# Patient Record
Sex: Female | Born: 1960 | Race: White | Hispanic: No | Marital: Married | Smoking: Former smoker
Health system: Southern US, Community
[De-identification: ages and names within clinical notes are randomized; demographics above are authoritative.]

## PROBLEM LIST (undated history)

## (undated) DIAGNOSIS — K219 Gastro-esophageal reflux disease without esophagitis: Secondary | ICD-10-CM

## (undated) DIAGNOSIS — K635 Polyp of colon: Secondary | ICD-10-CM

## (undated) DIAGNOSIS — C73 Malignant neoplasm of thyroid gland: Secondary | ICD-10-CM

## (undated) DIAGNOSIS — G43909 Migraine, unspecified, not intractable, without status migrainosus: Secondary | ICD-10-CM

## (undated) DIAGNOSIS — C4492 Squamous cell carcinoma of skin, unspecified: Secondary | ICD-10-CM

## (undated) DIAGNOSIS — E785 Hyperlipidemia, unspecified: Secondary | ICD-10-CM

## (undated) DIAGNOSIS — E119 Type 2 diabetes mellitus without complications: Secondary | ICD-10-CM

## (undated) DIAGNOSIS — E079 Disorder of thyroid, unspecified: Secondary | ICD-10-CM

## (undated) DIAGNOSIS — I10 Essential (primary) hypertension: Secondary | ICD-10-CM

## (undated) DIAGNOSIS — D3500 Benign neoplasm of unspecified adrenal gland: Secondary | ICD-10-CM

## (undated) DIAGNOSIS — E039 Hypothyroidism, unspecified: Secondary | ICD-10-CM

## (undated) DIAGNOSIS — C801 Malignant (primary) neoplasm, unspecified: Secondary | ICD-10-CM

## (undated) DIAGNOSIS — K760 Fatty (change of) liver, not elsewhere classified: Secondary | ICD-10-CM

## (undated) DIAGNOSIS — K8020 Calculus of gallbladder without cholecystitis without obstruction: Secondary | ICD-10-CM

## (undated) HISTORY — DX: Migraine, unspecified, not intractable, without status migrainosus: G43.909

## (undated) HISTORY — PX: THYROIDECTOMY: SHX17

## (undated) HISTORY — DX: Malignant neoplasm of thyroid gland: C73

## (undated) HISTORY — DX: Fatty (change of) liver, not elsewhere classified: K76.0

## (undated) HISTORY — DX: Type 2 diabetes mellitus without complications: E11.9

## (undated) HISTORY — PX: POLYPECTOMY: SHX149

## (undated) HISTORY — DX: Polyp of colon: K63.5

## (undated) HISTORY — DX: Benign neoplasm of unspecified adrenal gland: D35.00

## (undated) HISTORY — PX: COLONOSCOPY: SHX174

## (undated) HISTORY — DX: Gastro-esophageal reflux disease without esophagitis: K21.9

## (undated) HISTORY — DX: Hypothyroidism, unspecified: E03.9

## (undated) HISTORY — DX: Hyperlipidemia, unspecified: E78.5

## (undated) HISTORY — PX: OTHER SURGICAL HISTORY: SHX169

## (undated) HISTORY — DX: Calculus of gallbladder without cholecystitis without obstruction: K80.20

## (undated) HISTORY — DX: Essential (primary) hypertension: I10

## (undated) HISTORY — DX: Squamous cell carcinoma of skin, unspecified: C44.92

## (undated) HISTORY — PX: TUBAL LIGATION: SHX77

## (undated) HISTORY — DX: Disorder of thyroid, unspecified: E07.9

## (undated) HISTORY — PX: TONSILLECTOMY: SUR1361

---
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
## 1995-02-24 HISTORY — PX: TUBAL LIGATION: SHX77

## 1998-02-23 HISTORY — PX: COLONOSCOPY: SHX5424

## 2007-02-24 HISTORY — PX: THYROIDECTOMY: SHX17

## 2014-06-12 LAB — HM PAP SMEAR

## 2014-11-21 ENCOUNTER — Encounter: Payer: Self-pay | Admitting: Family Medicine

## 2014-11-21 ENCOUNTER — Ambulatory Visit (INDEPENDENT_AMBULATORY_CARE_PROVIDER_SITE_OTHER): Payer: BLUE CROSS/BLUE SHIELD | Admitting: Family Medicine

## 2014-11-21 VITALS — BP 174/120 | HR 80 | Ht 68.0 in | Wt 265.0 lb

## 2014-11-21 DIAGNOSIS — E890 Postprocedural hypothyroidism: Secondary | ICD-10-CM

## 2014-11-21 DIAGNOSIS — N3001 Acute cystitis with hematuria: Secondary | ICD-10-CM | POA: Diagnosis not present

## 2014-11-21 DIAGNOSIS — Z23 Encounter for immunization: Secondary | ICD-10-CM | POA: Diagnosis not present

## 2014-11-21 DIAGNOSIS — I10 Essential (primary) hypertension: Secondary | ICD-10-CM | POA: Insufficient documentation

## 2014-11-21 DIAGNOSIS — E039 Hypothyroidism, unspecified: Secondary | ICD-10-CM | POA: Insufficient documentation

## 2014-11-21 DIAGNOSIS — E785 Hyperlipidemia, unspecified: Secondary | ICD-10-CM | POA: Diagnosis not present

## 2014-11-21 DIAGNOSIS — R748 Abnormal levels of other serum enzymes: Secondary | ICD-10-CM

## 2014-11-21 DIAGNOSIS — Z1211 Encounter for screening for malignant neoplasm of colon: Secondary | ICD-10-CM | POA: Diagnosis not present

## 2014-11-21 DIAGNOSIS — Z85850 Personal history of malignant neoplasm of thyroid: Secondary | ICD-10-CM | POA: Insufficient documentation

## 2014-11-21 DIAGNOSIS — R030 Elevated blood-pressure reading, without diagnosis of hypertension: Secondary | ICD-10-CM

## 2014-11-21 DIAGNOSIS — E559 Vitamin D deficiency, unspecified: Secondary | ICD-10-CM

## 2014-11-21 DIAGNOSIS — IMO0001 Reserved for inherently not codable concepts without codable children: Secondary | ICD-10-CM

## 2014-11-21 DIAGNOSIS — E1159 Type 2 diabetes mellitus with other circulatory complications: Secondary | ICD-10-CM | POA: Insufficient documentation

## 2014-11-21 DIAGNOSIS — Z6841 Body Mass Index (BMI) 40.0 and over, adult: Secondary | ICD-10-CM | POA: Insufficient documentation

## 2014-11-21 LAB — POCT URINALYSIS DIPSTICK
BILIRUBIN UA: NEGATIVE
GLUCOSE UA: NEGATIVE
Ketones, UA: NEGATIVE
Nitrite, UA: POSITIVE
SPEC GRAV UA: 1.01
UROBILINOGEN UA: 0.2
pH, UA: 6

## 2014-11-21 MED ORDER — SULFAMETHOXAZOLE-TRIMETHOPRIM 800-160 MG PO TABS: 1.0000 | ORAL_TABLET | Freq: Two times a day (BID) | ORAL | Status: DC

## 2014-11-21 MED ORDER — LEVOTHYROXINE SODIUM 175 MCG PO TABS: 175.0000 ug | ORAL_TABLET | Freq: Every day | ORAL | Status: DC

## 2014-11-21 NOTE — Progress Notes (Signed)
Date:  11/21/2014   Name:  Miranda Delacruz   DOB:  11-23-1960   MRN:  [id number]  PCP:  Adline Potter, MD    Chief Complaint: Abdominal Pain   History of Present Illness:  This is a 54 y.o. female reports 2d hx lower abdominal pain, dysuria, and hematuria, no UTI in years. Just moved here from [place] in May. Tetanus unknown, had colonoscopy for abdominal pain age 88, normal. Hx thyroid ca s/p thyroidectomy 2009 followed by endo in [place]. Mammo and Pap ok in [place] just before left. No hx HTN but in pain and move has been stressful. Reports sign weight gain since recent menopause. Taking supplements for no specific dxs.  Review of Systems:  Review of Systems  Constitutional: Negative for fever, chills and unexpected weight change.  HENT: Negative for ear pain, sore throat and trouble swallowing.   Eyes: Negative for pain.  Respiratory: Negative for shortness of breath.   Cardiovascular: Negative for chest pain, palpitations and leg swelling.  Gastrointestinal: Negative for nausea, vomiting, diarrhea and constipation.  Endocrine: Negative for polyuria.  Genitourinary: Negative for difficulty urinating.  Neurological: Negative for weakness, light-headedness, numbness and headaches.    Patient Active Problem List   Diagnosis Date Noted  . Obesity, Class III, BMI 40-49.9 (morbid obesity) 11/21/2014  . Hypothyroidism 11/21/2014  . HTN (hypertension) 11/21/2014    Prior to Admission medications   Medication Sig Start Date End Date Taking? Authorizing Provider  levothyroxine (SYNTHROID, LEVOTHROID) 175 MCG tablet Take 1 tablet (175 mcg total) by mouth daily before breakfast. 11/21/14  Yes Adline Potter, MD  calcium-vitamin D 250-100 MG-UNIT tablet Take 1 tablet by mouth 1 day or 1 dose.   Yes Historical Provider, MD  co-enzyme Q-10 30 MG capsule Take 100 mg by mouth daily.   Yes Historical Provider, MD  magnesium oxide (MAG-OX) 400 MG tablet Take 2 tablets by mouth daily.   Yes Historical Provider,  MD  Omega-3 Fatty Acids (FISH OIL) 1200 MG CAPS Take 2 capsules by mouth daily.   Yes Historical Provider, MD  sulfamethoxazole-trimethoprim (BACTRIM DS,SEPTRA DS) 800-160 MG tablet Take 1 tablet by mouth 2 (two) times daily. 11/21/14   Adline Potter, MD    No Known Allergies  Past Surgical History  Procedure Laterality Date  . Tonsillectomy    . Tubal ligation    . Thyroidectomy    . Colonoscopy  2000    normal    Social History  Substance Use Topics  . Smoking status: Former Research scientist (life sciences)  . Smokeless tobacco: None  . Alcohol Use: 0.0 oz/week    0 Standard drinks or equivalent per week    Family History  Problem Relation Age of Onset  . Hypertension Mother   . Hypertension Maternal Grandmother     Medication list has been reviewed and updated.  Physical Examination: BP 174/120 mmHg  Pulse 80  Ht 5' 8"  (1.727 m)  Wt 265 lb (120.203 kg)  BMI 40.30 kg/m2  Physical Exam  Constitutional: She appears well-developed and well-nourished. No distress.  HENT:  Head: Normocephalic and atraumatic.  Right Ear: External ear normal.  Left Ear: External ear normal.  Nose: Nose normal.  Mouth/Throat: Oropharynx is clear and moist.  Eyes: Conjunctivae and EOM are normal. Pupils are equal, round, and reactive to light.  Neck: Neck supple.  Cardiovascular: Normal rate, regular rhythm, normal heart sounds and intact distal pulses.   Pulmonary/Chest: Effort normal and breath sounds normal.  Abdominal: Soft. She exhibits no distension  and no mass.  Mild suprapubic tenderness  Genitourinary:  No CVAT  Musculoskeletal: She exhibits no edema.  Lymphadenopathy:    She has no cervical adenopathy.  Neurological: She is alert. Coordination normal.  Skin: Skin is warm and dry. She is not diaphoretic.  Psychiatric: She has a normal mood and affect. Her behavior is normal.    Assessment and Plan:  1. Acute cystitis with hematuria 3+ blood, + nitrite, 2+ leuk on UA, uncomplicated -  sulfamethoxazole-trimethoprim (BACTRIM DS,SEPTRA DS) 800-160 MG tablet; Take 1 tablet by mouth 2 (two) times daily.  Dispense: 6 tablet; Refill: 0 - POCT Urinalysis Dipstick  2. Obesity, Class III, BMI 40-49.9 (morbid obesity) Discussed weight loss/exercise - Comprehensive metabolic panel - CBC - Lipid Profile - Vitamin D (25 hydroxy)  3. Essential hypertension Poor control due to pain, consider medication if elevated on f/u  4. Postoperative hypothyroidism Unclear control - TSH - levothyroxine (SYNTHROID, LEVOTHROID) 175 MCG tablet; Take 1 tablet (175 mcg total) by mouth daily before breakfast.  Dispense: 90 tablet; Refill: 3  5. Hx of thyroid cancer - Ambulatory referral to Endocrinology  6. Need for influenza vaccination - Flu Vaccine QUAD 36+ mos PF IM (Fluarix & Fluzone Quad PF)  7. Colon cancer screening - Ambulatory referral to Gastroenterology  8. Supplement use Consider d/c calcium, co-Q, and mg oxide  Return in about 4 weeks (around 12/19/2014).  Satira Anis. [facility]  11/21/2014

## 2014-12-03 ENCOUNTER — Telehealth: Payer: Self-pay

## 2014-12-03 MED ORDER — FLUCONAZOLE 150 MG PO TABS: 150.0000 mg | ORAL_TABLET | Freq: Once | ORAL | Status: DC

## 2014-12-03 NOTE — Telephone Encounter (Signed)
Sent message to [facility]

## 2014-12-03 NOTE — Addendum Note (Signed)
Addended by: Adline Potter on: 12/03/2014 12:01 PM   Modules accepted: Orders

## 2014-12-05 ENCOUNTER — Telehealth: Payer: Self-pay | Admitting: Gastroenterology

## 2014-12-05 ENCOUNTER — Other Ambulatory Visit: Payer: Self-pay

## 2014-12-05 DIAGNOSIS — Z1211 Encounter for screening for malignant neoplasm of colon: Secondary | ICD-10-CM

## 2014-12-05 MED ORDER — SOD PICOSULFATE-MAG OX-CIT ACD 10-3.5-12 MG-GM-GM PO PACK: 1.0000 | PACK | ORAL | Status: DC

## 2014-12-05 NOTE — Telephone Encounter (Signed)
Gastroenterology Pre-Procedure Review  Request Date: 12-31-2014 Requesting Physician: Dr.   PATIENT REVIEW QUESTIONS: The patient responded to the following health history questions as indicated:    1. Are you having any GI issues? no 2. Do you have a personal history of Polyps? no 3. Do you have a family history of Colon Cancer or Polyps? no 4. Diabetes Mellitus? no 5. Joint replacements in the past 12 months?no 6. Major health problems in the past 3 months?no 7. Any artificial heart valves, MVP, or defibrillator?no    MEDICATIONS & ALLERGIES:    Patient reports the following regarding taking any anticoagulation/antiplatelet therapy:   Plavix, Coumadin, Eliquis, Xarelto, Lovenox, Pradaxa, Brilinta, or Effient? no Aspirin? no  Patient confirms/reports the following medications:  Current Outpatient Prescriptions  Medication Sig Dispense Refill   calcium-vitamin D 250-100 MG-UNIT tablet Take 1 tablet by mouth 1 day or 1 dose.     co-enzyme Q-10 30 MG capsule Take 100 mg by mouth daily.     fluconazole (DIFLUCAN) 150 MG tablet Take 1 tablet (150 mg total) by mouth once. 1 tablet 0   levothyroxine (SYNTHROID, LEVOTHROID) 175 MCG tablet Take 1 tablet (175 mcg total) by mouth daily before breakfast. 90 tablet 3   magnesium oxide (MAG-OX) 400 MG tablet Take 2 tablets by mouth daily.     Omega-3 Fatty Acids (FISH OIL) 1200 MG CAPS Take 2 capsules by mouth daily.     sulfamethoxazole-trimethoprim (BACTRIM DS,SEPTRA DS) 800-160 MG tablet Take 1 tablet by mouth 2 (two) times daily. 6 tablet 0   No current facility-administered medications for this visit.    Patient confirms/reports the following allergies:  No Known Allergies  No orders of the defined types were placed in this encounter.    AUTHORIZATION INFORMATION Primary Insurance: 1D#: Group #:  Secondary Insurance: 1D#: Group #:  SCHEDULE INFORMATION: Date: 12-31-2014 Time: Location:[facility]

## 2014-12-07 ENCOUNTER — Telehealth: Payer: Self-pay

## 2014-12-07 ENCOUNTER — Other Ambulatory Visit: Payer: Self-pay

## 2014-12-07 ENCOUNTER — Other Ambulatory Visit: Payer: Self-pay | Admitting: Family Medicine

## 2014-12-07 MED ORDER — CIPROFLOXACIN HCL 250 MG PO TABS: 250.0000 mg | ORAL_TABLET | Freq: Two times a day (BID) | ORAL | Status: DC

## 2014-12-07 NOTE — Telephone Encounter (Signed)
Sent to [facility] 

## 2014-12-07 NOTE — Telephone Encounter (Signed)
Will send in alternate antibiotic.

## 2014-12-07 NOTE — Telephone Encounter (Signed)
Is this new episode or continuation of sxs from two weeks ago?

## 2014-12-08 LAB — CBC
HEMATOCRIT: 41.1 % (ref 34.0–46.6)
Hemoglobin: 14.4 g/dL (ref 11.1–15.9)
MCH: 32.2 pg (ref 26.6–33.0)
MCHC: 35 g/dL (ref 31.5–35.7)
MCV: 92 fL (ref 79–97)
PLATELETS: 223 10*3/uL (ref 150–379)
RBC: 4.47 x10E6/uL (ref 3.77–5.28)
RDW: 14.4 % (ref 12.3–15.4)
WBC: 5.7 10*3/uL (ref 3.4–10.8)

## 2014-12-08 LAB — COMPREHENSIVE METABOLIC PANEL
A/G RATIO: 2.4 (ref 1.1–2.5)
ALBUMIN: 4.4 g/dL (ref 3.5–5.5)
ALK PHOS: 68 IU/L (ref 39–117)
ALT: 179 IU/L — ABNORMAL HIGH (ref 0–32)
AST: 133 IU/L — ABNORMAL HIGH (ref 0–40)
BILIRUBIN TOTAL: 0.9 mg/dL (ref 0.0–1.2)
BUN / CREAT RATIO: 13 (ref 9–23)
BUN: 9 mg/dL (ref 6–24)
CHLORIDE: 97 mmol/L (ref 97–108)
CO2: 24 mmol/L (ref 18–29)
Calcium: 9.6 mg/dL (ref 8.7–10.2)
Creatinine, Ser: 0.68 mg/dL (ref 0.57–1.00)
GFR calc non Af Amer: 101 mL/min/{1.73_m2} (ref >59)
GFR, EST AFRICAN AMERICAN: 116 mL/min/{1.73_m2} (ref >59)
GLOBULIN, TOTAL: 1.8 g/dL (ref 1.5–4.5)
Glucose: 95 mg/dL (ref 65–99)
POTASSIUM: 3.9 mmol/L (ref 3.5–5.2)
SODIUM: 140 mmol/L (ref 134–144)
TOTAL PROTEIN: 6.2 g/dL (ref 6.0–8.5)

## 2014-12-08 LAB — TSH: TSH: 0.759 u[IU]/mL (ref 0.450–4.500)

## 2014-12-08 LAB — LIPID PANEL
Chol/HDL Ratio: 7.1 ratio units — ABNORMAL HIGH (ref 0.0–4.4)
Cholesterol, Total: 254 mg/dL — ABNORMAL HIGH (ref 100–199)
HDL: 36 mg/dL — AB (ref >39)
LDL CALC: 149 mg/dL — AB (ref 0–99)
TRIGLYCERIDES: 347 mg/dL — AB (ref 0–149)
VLDL Cholesterol Cal: 69 mg/dL — ABNORMAL HIGH (ref 5–40)

## 2014-12-08 LAB — VITAMIN D 25 HYDROXY (VIT D DEFICIENCY, FRACTURES): Vit D, 25-Hydroxy: 18.6 ng/mL — ABNORMAL LOW (ref 30.0–100.0)

## 2014-12-10 DIAGNOSIS — E785 Hyperlipidemia, unspecified: Secondary | ICD-10-CM | POA: Insufficient documentation

## 2014-12-10 DIAGNOSIS — E1169 Type 2 diabetes mellitus with other specified complication: Secondary | ICD-10-CM | POA: Insufficient documentation

## 2014-12-10 DIAGNOSIS — R748 Abnormal levels of other serum enzymes: Secondary | ICD-10-CM | POA: Insufficient documentation

## 2014-12-10 DIAGNOSIS — E559 Vitamin D deficiency, unspecified: Secondary | ICD-10-CM | POA: Insufficient documentation

## 2014-12-10 MED ORDER — VITAMIN D 50 MCG (2000 UT) PO CAPS: 1.0000 | ORAL_CAPSULE | Freq: Every day | ORAL | Status: DC

## 2014-12-10 NOTE — Addendum Note (Signed)
Addended by: Adline Potter on: 12/10/2014 10:22 AM   Modules accepted: Orders, Medications

## 2014-12-11 ENCOUNTER — Other Ambulatory Visit: Payer: BLUE CROSS/BLUE SHIELD

## 2014-12-13 ENCOUNTER — Ambulatory Visit: Payer: BLUE CROSS/BLUE SHIELD

## 2014-12-17 ENCOUNTER — Other Ambulatory Visit: Payer: Self-pay

## 2014-12-18 ENCOUNTER — Ambulatory Visit
Admission: RE | Admit: 2014-12-18 | Discharge: 2014-12-18 | Disposition: A | Discharge status: Home / Self Care | Payer: BLUE CROSS/BLUE SHIELD | Source: Ambulatory Visit | Attending: Family Medicine | Admitting: Family Medicine

## 2014-12-18 DIAGNOSIS — K838 Other specified diseases of biliary tract: Secondary | ICD-10-CM | POA: Insufficient documentation

## 2014-12-18 DIAGNOSIS — K760 Fatty (change of) liver, not elsewhere classified: Secondary | ICD-10-CM

## 2014-12-18 DIAGNOSIS — K8020 Calculus of gallbladder without cholecystitis without obstruction: Secondary | ICD-10-CM | POA: Insufficient documentation

## 2014-12-18 DIAGNOSIS — K769 Liver disease, unspecified: Secondary | ICD-10-CM | POA: Diagnosis not present

## 2014-12-18 DIAGNOSIS — R748 Abnormal levels of other serum enzymes: Secondary | ICD-10-CM

## 2014-12-18 DIAGNOSIS — R945 Abnormal results of liver function studies: Secondary | ICD-10-CM | POA: Diagnosis present

## 2014-12-19 ENCOUNTER — Encounter: Payer: Self-pay | Admitting: Family Medicine

## 2014-12-19 ENCOUNTER — Ambulatory Visit (INDEPENDENT_AMBULATORY_CARE_PROVIDER_SITE_OTHER): Payer: BLUE CROSS/BLUE SHIELD | Admitting: Family Medicine

## 2014-12-19 VITALS — BP 130/100 | HR 70 | Ht 68.0 in | Wt 270.0 lb

## 2014-12-19 DIAGNOSIS — Z85850 Personal history of malignant neoplasm of thyroid: Secondary | ICD-10-CM

## 2014-12-19 DIAGNOSIS — E890 Postprocedural hypothyroidism: Secondary | ICD-10-CM

## 2014-12-19 DIAGNOSIS — K760 Fatty (change of) liver, not elsewhere classified: Secondary | ICD-10-CM | POA: Diagnosis not present

## 2014-12-19 DIAGNOSIS — I10 Essential (primary) hypertension: Secondary | ICD-10-CM | POA: Diagnosis not present

## 2014-12-19 DIAGNOSIS — E559 Vitamin D deficiency, unspecified: Secondary | ICD-10-CM

## 2014-12-19 DIAGNOSIS — E785 Hyperlipidemia, unspecified: Secondary | ICD-10-CM

## 2014-12-19 NOTE — Progress Notes (Signed)
Date:  12/19/2014   Name:  Miranda Delacruz   DOB:  11-17-1960   MRN:  [id number]  PCP:  Adline Potter, MD    Chief Complaint: Follow-up   History of Present Illness:  This is a 54 y.o. female seen in f/u for fatty liver dz, HLD, HTN, and vit D def. Blood work showed elevated LFT's, liver US showed fatty liver. Blood work also showed HLD with elevated TC/HDL ratio (non-fasting) and low vit D level. Patient states she is heavier than ever but plans to start exercise/weight loss program now that she is settled from move. Has endo appt for f/u thyroid ca today and has colonoscopy scheduled for next week.  Review of Systems:  Review of Systems  Respiratory: Negative for shortness of breath.   Cardiovascular: Negative for chest pain and leg swelling.  Gastrointestinal: Negative for abdominal pain.  Endocrine: Negative for polyuria.  Genitourinary: Negative for difficulty urinating.    Patient Active Problem List   Diagnosis Date Noted  . Fatty liver disease, nonalcoholic 96/22/2979  . Elevated liver enzymes 12/10/2014  . Hyperlipidemia 12/10/2014  . Vitamin D deficiency 12/10/2014  . Obesity, Class III, BMI 40-49.9 (morbid obesity) ([place]) 11/21/2014  . Hypothyroidism 11/21/2014  . HTN (hypertension) 11/21/2014  . Hx of thyroid cancer 11/21/2014    Prior to Admission medications   Medication Sig Start Date End Date Taking? Authorizing Provider  calcium-vitamin D 250-100 MG-UNIT tablet Take 1 tablet by mouth 1 day or 1 dose.   Yes Historical Provider, MD  Cholecalciferol (VITAMIN D) 2000 UNITS CAPS Take 1 capsule (2,000 Units total) by mouth daily. 12/10/14  Yes Adline Potter, MD  co-enzyme Q-10 30 MG capsule Take 100 mg by mouth daily.   Yes Historical Provider, MD  levothyroxine (SYNTHROID, LEVOTHROID) 175 MCG tablet Take 1 tablet (175 mcg total) by mouth daily before breakfast. 11/21/14  Yes Adline Potter, MD  magnesium oxide (MAG-OX) 400 MG tablet Take 2 tablets by mouth daily.   Yes  Historical Provider, MD  Omega-3 Fatty Acids (FISH OIL) 1200 MG CAPS Take 2 capsules by mouth daily.   Yes Historical Provider, MD    No Known Allergies  Past Surgical History  Procedure Laterality Date  . Tonsillectomy    . Tubal ligation    . Thyroidectomy    . Colonoscopy  2000    normal    Social History  Substance Use Topics  . Smoking status: Former Research scientist (life sciences)  . Smokeless tobacco: None  . Alcohol Use: 0.0 oz/week    0 Standard drinks or equivalent per week    Family History  Problem Relation Age of Onset  . Hypertension Mother   . Hypertension Maternal Grandmother     Medication list has been reviewed and updated.  Physical Examination: BP 130/100 mmHg  Pulse 70  Ht 5' 8"  (1.727 m)  Wt 270 lb (122.471 kg)  BMI 41.06 kg/m2  Physical Exam  Constitutional: She appears well-developed and well-nourished.  Cardiovascular: Normal rate, regular rhythm and normal heart sounds.   Pulmonary/Chest: Effort normal and breath sounds normal.  Musculoskeletal: She exhibits no edema.  Neurological: She is alert.  Skin: Skin is warm and dry.  Psychiatric: She has a normal mood and affect. Her behavior is normal.  Nursing note and vitals reviewed.   Assessment and Plan:  1. Fatty liver disease, nonalcoholic Discussed etiology, need for weight loss, recheck LFT's next visit  2. Essential hypertension Improved but still marginal, should improve with weight loss  3. Postoperative hypothyroidism Well controlled on current Synthroid dose  4. Obesity, Class III, BMI 40-49.9 (morbid obesity) ([place]) Discussed exercise/weight loss, pt planning to purchase treadmill and begin diet  5. Hx of thyroid cancer To see endo today  6. Hyperlipidemia Should improve with weight loss, recheck lipids (and a1c) next visit  7. Vitamin D deficiency Continue supplementation, recommend d/c calcium/coQ/mg oxide supplements   Return in about 3 months (around 03/21/2015).  Satira Anis. [facility]  12/19/2014

## 2014-12-31 ENCOUNTER — Encounter: Admission: RE | Payer: Self-pay | Source: Ambulatory Visit

## 2014-12-31 ENCOUNTER — Ambulatory Visit
Admission: RE | Admit: 2014-12-31 | Payer: BLUE CROSS/BLUE SHIELD | Source: Ambulatory Visit | Admitting: Gastroenterology

## 2014-12-31 SURGERY — COLONOSCOPY WITH PROPOFOL
Anesthesia: Choice

## 2015-01-22 ENCOUNTER — Encounter
Admission: RE | Disposition: A | Discharge status: Home / Self Care | Payer: Self-pay | Source: Ambulatory Visit | Attending: Gastroenterology

## 2015-01-22 ENCOUNTER — Ambulatory Visit
Admission: RE | Admit: 2015-01-22 | Discharge: 2015-01-22 | Disposition: A | Discharge status: Home / Self Care | Payer: BLUE CROSS/BLUE SHIELD | Source: Ambulatory Visit | Attending: Gastroenterology | Admitting: Gastroenterology

## 2015-01-22 ENCOUNTER — Ambulatory Visit: Payer: BLUE CROSS/BLUE SHIELD | Admitting: Anesthesiology

## 2015-01-22 DIAGNOSIS — D374 Neoplasm of uncertain behavior of colon: Secondary | ICD-10-CM | POA: Insufficient documentation

## 2015-01-22 DIAGNOSIS — E890 Postprocedural hypothyroidism: Secondary | ICD-10-CM | POA: Insufficient documentation

## 2015-01-22 DIAGNOSIS — D123 Benign neoplasm of transverse colon: Secondary | ICD-10-CM | POA: Diagnosis not present

## 2015-01-22 DIAGNOSIS — K5730 Diverticulosis of large intestine without perforation or abscess without bleeding: Secondary | ICD-10-CM | POA: Insufficient documentation

## 2015-01-22 DIAGNOSIS — Z8249 Family history of ischemic heart disease and other diseases of the circulatory system: Secondary | ICD-10-CM | POA: Diagnosis not present

## 2015-01-22 DIAGNOSIS — E079 Disorder of thyroid, unspecified: Secondary | ICD-10-CM | POA: Diagnosis not present

## 2015-01-22 DIAGNOSIS — Z87891 Personal history of nicotine dependence: Secondary | ICD-10-CM | POA: Diagnosis not present

## 2015-01-22 DIAGNOSIS — I10 Essential (primary) hypertension: Secondary | ICD-10-CM | POA: Diagnosis not present

## 2015-01-22 DIAGNOSIS — Z859 Personal history of malignant neoplasm, unspecified: Secondary | ICD-10-CM | POA: Diagnosis not present

## 2015-01-22 DIAGNOSIS — K5520 Angiodysplasia of colon without hemorrhage: Secondary | ICD-10-CM | POA: Insufficient documentation

## 2015-01-22 DIAGNOSIS — Z1211 Encounter for screening for malignant neoplasm of colon: Secondary | ICD-10-CM | POA: Insufficient documentation

## 2015-01-22 DIAGNOSIS — D125 Benign neoplasm of sigmoid colon: Secondary | ICD-10-CM | POA: Diagnosis not present

## 2015-01-22 DIAGNOSIS — Z79899 Other long term (current) drug therapy: Secondary | ICD-10-CM | POA: Diagnosis not present

## 2015-01-22 DIAGNOSIS — D122 Benign neoplasm of ascending colon: Secondary | ICD-10-CM | POA: Insufficient documentation

## 2015-01-22 HISTORY — DX: Malignant (primary) neoplasm, unspecified: C80.1

## 2015-01-22 HISTORY — PX: COLONOSCOPY WITH PROPOFOL: SHX5780

## 2015-01-22 SURGERY — COLONOSCOPY WITH PROPOFOL
Anesthesia: General

## 2015-01-22 MED ORDER — PROPOFOL 500 MG/50ML IV EMUL
INTRAVENOUS | Status: DC | PRN
  Administered 2015-01-22: 160 ug/kg/min via INTRAVENOUS

## 2015-01-22 MED ORDER — MIDAZOLAM HCL 2 MG/2ML IJ SOLN
INTRAMUSCULAR | Status: DC | PRN
  Administered 2015-01-22: 2 mg via INTRAVENOUS

## 2015-01-22 MED ORDER — LIDOCAINE HCL (CARDIAC) 20 MG/ML IV SOLN
INTRAVENOUS | Status: DC | PRN
  Administered 2015-01-22: 100 mg via INTRAVENOUS

## 2015-01-22 MED ORDER — SODIUM CHLORIDE 0.9 % IV SOLN
INTRAVENOUS | Status: DC
  Administered 2015-01-22: 1000 mL via INTRAVENOUS

## 2015-01-22 NOTE — H&P (Signed)
  [facility]  [address] [place], [place] [address] Phone: [phone] Fax : [phone]  Primary Care Physician:  Adline Potter, MD Primary Gastroenterologist:  Dr. Allen Norris  Pre-Procedure History & Physical: HPI:  Miranda Delacruz is a 54 y.o. female is here for a screening colonoscopy.   Past Medical History  Diagnosis Date  . Thyroid disease   . Hypertension   . Cancer [facility])     Past Surgical History  Procedure Laterality Date  . Tonsillectomy    . Tubal ligation    . Thyroidectomy    . Colonoscopy  2000    normal    Prior to Admission medications   Medication Sig Start Date End Date Taking? Authorizing Provider  calcium-vitamin D 250-100 MG-UNIT tablet Take 1 tablet by mouth 1 day or 1 dose.    Historical Provider, MD  Cholecalciferol (VITAMIN D) 2000 UNITS CAPS Take 1 capsule (2,000 Units total) by mouth daily. 12/10/14   Adline Potter, MD  co-enzyme Q-10 30 MG capsule Take 100 mg by mouth daily.    Historical Provider, MD  levothyroxine (SYNTHROID, LEVOTHROID) 175 MCG tablet Take 1 tablet (175 mcg total) by mouth daily before breakfast. 11/21/14   Adline Potter, MD  magnesium oxide (MAG-OX) 400 MG tablet Take 2 tablets by mouth daily.    Historical Provider, MD  Omega-3 Fatty Acids (FISH OIL) 1200 MG CAPS Take 2 capsules by mouth daily.    Historical Provider, MD    Allergies as of 12/17/2014  . (No Known Allergies)    Family History  Problem Relation Age of Onset  . Hypertension Mother   . Hypertension Maternal Grandmother     Social History   Social History  . Marital Status: Married    Spouse Name: N/A  . Number of Children: N/A  . Years of Education: N/A   Occupational History  . Not on file.   Social History Main Topics  . Smoking status: Former Research scientist (life sciences)  . Smokeless tobacco: Not on file  . Alcohol Use: 0.0 oz/week    0 Standard drinks or equivalent per week  . Drug Use: No  . Sexual Activity: Yes   Other Topics Concern    . Not on file   Social History Narrative    Review of Systems: See HPI, otherwise negative ROS  Physical Exam: BP 179/95 mmHg  Pulse 79  Temp(Src) 98.2 F (36.8 C) (Tympanic)  Resp 20  Ht 5' 8"  (1.727 m)  Wt 266 lb (120.657 kg)  BMI 40.45 kg/m2  SpO2 100% General:   Alert,  pleasant and cooperative in NAD Head:  Normocephalic and atraumatic. Neck:  Supple; no masses or thyromegaly. Lungs:  Clear throughout to auscultation.    Heart:  Regular rate and rhythm. Abdomen:  Soft, nontender and nondistended. Normal bowel sounds, without guarding, and without rebound.   Neurologic:  Alert and  oriented x4;  grossly normal neurologically.  Impression/Plan: Miranda Delacruz is now here to undergo a screening colonoscopy.  Risks, benefits, and alternatives regarding colonoscopy have been reviewed with the patient.  Questions have been answered.  All parties agreeable.

## 2015-01-22 NOTE — Anesthesia Postprocedure Evaluation (Signed)
Anesthesia Post Note  Patient: Miranda Delacruz  Procedure(s) Performed: Procedure(s) (LRB): COLONOSCOPY WITH PROPOFOL (N/A)  Patient location during evaluation: PACU Anesthesia Type: General Level of consciousness: awake and alert Pain management: pain level controlled Vital Signs Assessment: post-procedure vital signs reviewed and stable Respiratory status: spontaneous breathing, nonlabored ventilation, respiratory function stable and patient connected to nasal cannula oxygen Cardiovascular status: blood pressure returned to baseline and stable Postop Assessment: no signs of nausea or vomiting Anesthetic complications: no    Last Vitals:  Filed Vitals:   01/22/15 0910 01/22/15 0920  BP: 137/89 147/88  Pulse:    Temp:    Resp:      Last Pain:  Filed Vitals:   01/22/15 0932  PainSc: 0-No pain                 Molli Barrows

## 2015-01-22 NOTE — Anesthesia Preprocedure Evaluation (Signed)
Anesthesia Evaluation  Patient identified by MRN, date of birth, ID band Patient awake    Reviewed: Allergy & Precautions, H&P , NPO status , Patient's Chart, lab work & pertinent test results, reviewed documented beta blocker date and time   Airway Mallampati: II  TM Distance: >3 FB Neck ROM: full    Dental no notable dental hx.    Pulmonary neg pulmonary ROS, former smoker,    Pulmonary exam normal breath sounds clear to auscultation       Cardiovascular Exercise Tolerance: Good hypertension, negative cardio ROS   Rhythm:regular Rate:Normal     Neuro/Psych negative neurological ROS  negative psych ROS   GI/Hepatic negative GI ROS, Neg liver ROS,   Endo/Other  negative endocrine ROSHypothyroidism   Renal/GU negative Renal ROS  negative genitourinary   Musculoskeletal negative musculoskeletal ROS (+)   Abdominal   Peds negative pediatric ROS (+)  Hematology negative hematology ROS (+)   Anesthesia Other Findings   Reproductive/Obstetrics negative OB ROS                             Anesthesia Physical Anesthesia Plan  ASA: II  Anesthesia Plan: General   Post-op Pain Management:    Induction:   Airway Management Planned:   Additional Equipment:   Intra-op Plan:   Post-operative Plan:   Informed Consent: I have reviewed the patients History and Physical, chart, labs and discussed the procedure including the risks, benefits and alternatives for the proposed anesthesia with the patient or authorized representative who has indicated his/her understanding and acceptance.   Dental Advisory Given  Plan Discussed with: CRNA  Anesthesia Plan Comments:         Anesthesia Quick Evaluation

## 2015-01-22 NOTE — Op Note (Signed)
[facility] Patient Name: Miranda Delacruz Procedure Date: 01/22/2015 8:21 AM MRN: [id number] Account #: [id number] Date of Birth: 1960-11-23 Admit Type: Outpatient Age: 54 Room: [facility] ENDO ROOM 4 Gender: Female Note Status: Finalized Procedure:         Colonoscopy Indications:       Screening for colorectal malignant neoplasm Providers:         Lucilla Lame, MD Referring MD:      Satira Anis. [facility], MD (Referring MD) Medicines:         Propofol per Anesthesia Complications:     No immediate complications. Procedure:         Pre-Anesthesia Assessment:                    - Prior to the procedure, a History and Physical was                     performed, and patient medications and allergies were                     reviewed. The patient's tolerance of previous anesthesia                     was also reviewed. The risks and benefits of the procedure                     and the sedation options and risks were discussed with the                     patient. All questions were answered, and informed consent                     was obtained. Prior Anticoagulants: The patient has taken                     no previous anticoagulant or antiplatelet agents. ASA                     Grade Assessment: II - A patient with mild systemic                     disease. After reviewing the risks and benefits, the                     patient was deemed in satisfactory condition to undergo                     the procedure.                    After obtaining informed consent, the colonoscope was                     passed under direct vision. Throughout the procedure, the                     patient's blood pressure, pulse, and oxygen saturations                     were monitored continuously. The Colonoscope was                     introduced through the anus and advanced to the the cecum,  identified by appendiceal orifice and ileocecal valve. The                   colonoscopy was performed without difficulty. The patient                     tolerated the procedure well. The quality of the bowel                     preparation was fair. Findings:      The perianal and digital rectal examinations were normal.      A 4 mm polyp was found in the ascending colon. The polyp was sessile.       The polyp was removed with a cold snare. Resection and retrieval were       complete.      A 4 mm polyp was found in the transverse colon. The polyp was sessile.       The polyp was removed with a cold biopsy forceps. Resection and       retrieval were complete.      A 3 mm polyp was found in the sigmoid colon. The polyp was sessile. The       polyp was removed with a cold biopsy forceps. Resection and retrieval       were complete.      Multiple small-mouthed diverticula were found in the sigmoid colon and       in the descending colon.      A single medium-sized localized angiodysplastic lesion with bleeding on       contact was found in the cecum. Impression:        - One 4 mm polyp in the ascending colon. Resected and                     retrieved.                    - One 4 mm polyp in the transverse colon. Resected and                     retrieved.                    - One 3 mm polyp in the sigmoid colon. Resected and                     retrieved.                    - Diverticulosis in the sigmoid colon and in the                     descending colon.                    - A single colonic angiodysplastic lesion. Recommendation:    - Await pathology results.                    - Repeat colonoscopy in 5 years if polyp adenoma and 10                     years if hyperplastic Procedure Code(s): --- Professional ---                    [phone], Colonoscopy, flexible; with removal of tumor(s),  polyp(s), or other lesion(s) by snare technique                    [id number], 59, Colonoscopy, flexible; with biopsy, single or                      multiple Diagnosis Code(s): --- Professional ---                    Z12.11, Encounter for screening for malignant neoplasm of                     colon                    D12.2, Benign neoplasm of ascending colon                    D12.3, Benign neoplasm of transverse colon                    D12.5, Benign neoplasm of sigmoid colon CPT copyright 2014 American Medical Association. All rights reserved. The codes documented in this report are preliminary and upon coder review may  be revised to meet current compliance requirements. Lucilla Lame, MD 01/22/2015 8:41:41 AM This report has been signed electronically. Number of Addenda: 0 Note Initiated On: 01/22/2015 8:21 AM Scope Withdrawal Time: 0 hours 7 minutes 45 seconds  Total Procedure Duration: 0 hours 12 minutes 49 seconds       [facility]

## 2015-01-22 NOTE — Transfer of Care (Signed)
Immediate Anesthesia Transfer of Care Note  Patient: Miranda Delacruz  Procedure(s) Performed: Procedure(s): COLONOSCOPY WITH PROPOFOL (N/A)  Patient Location: PACU and Endoscopy Unit  Anesthesia Type:General  Level of Consciousness: sedated  Airway & Oxygen Therapy: Patient Spontanous Breathing and Patient connected to nasal cannula oxygen  Post-op Assessment: Report given to RN and Post -op Vital signs reviewed and stable  Post vital signs: Reviewed and stable  Last Vitals: 8:43 119/74 100% 78 hr 16 resp 97 temp Filed Vitals:   01/22/15 0755 01/22/15 0756  BP:  179/95  Pulse: 79   Temp: 36.8 C   Resp: 20     Complications: No apparent anesthesia complications

## 2015-01-23 ENCOUNTER — Encounter: Payer: Self-pay | Admitting: Gastroenterology

## 2015-01-23 LAB — SURGICAL PATHOLOGY

## 2015-01-24 ENCOUNTER — Encounter: Payer: Self-pay | Admitting: Gastroenterology

## 2015-02-11 ENCOUNTER — Encounter: Payer: Self-pay | Admitting: Family Medicine

## 2015-02-11 ENCOUNTER — Ambulatory Visit (INDEPENDENT_AMBULATORY_CARE_PROVIDER_SITE_OTHER): Payer: BLUE CROSS/BLUE SHIELD | Admitting: Family Medicine

## 2015-02-11 VITALS — BP 140/90 | HR 80 | Ht 68.0 in | Wt 265.4 lb

## 2015-02-11 DIAGNOSIS — Z85850 Personal history of malignant neoplasm of thyroid: Secondary | ICD-10-CM | POA: Diagnosis not present

## 2015-02-11 DIAGNOSIS — G43109 Migraine with aura, not intractable, without status migrainosus: Secondary | ICD-10-CM

## 2015-02-11 DIAGNOSIS — E890 Postprocedural hypothyroidism: Secondary | ICD-10-CM | POA: Diagnosis not present

## 2015-02-11 DIAGNOSIS — E559 Vitamin D deficiency, unspecified: Secondary | ICD-10-CM

## 2015-02-11 DIAGNOSIS — K760 Fatty (change of) liver, not elsewhere classified: Secondary | ICD-10-CM

## 2015-02-11 DIAGNOSIS — E785 Hyperlipidemia, unspecified: Secondary | ICD-10-CM | POA: Diagnosis not present

## 2015-02-11 DIAGNOSIS — I10 Essential (primary) hypertension: Secondary | ICD-10-CM

## 2015-02-11 MED ORDER — SUMATRIPTAN SUCCINATE 100 MG PO TABS: 100.0000 mg | ORAL_TABLET | Freq: Once | ORAL | Status: DC

## 2015-02-11 NOTE — Progress Notes (Signed)
Date:  02/11/2015   Name:  Miranda Delacruz   DOB:  16-Feb-1961   MRN:  [id number]  PCP:  Adline Potter, MD    Chief Complaint: Hypertension and Headache   History of Present Illness:  This is a 54 y.o. female for f/u HTN, fatty liver, HLD, vit D def. Saw endo re: hx thyroid ca, no new issues. Had colonoscopy with 3 precancerous polyps, f/u in 5 yrs recommended. Saw ENT last week, received Zpak for sinusitis, improved. Has lost 5# with better diet, more exercise. Prefers to keep taking fish oil, calcium, magnesium, and co-Q10 supplements (says eats little dairy). Also c/o migraines with aura since age 42, 3 episodes past 6 months, previously responded to Midrin, wants to try oral Imitrex.  Review of Systems:  Review of Systems  Constitutional: Negative for fever and fatigue.  Respiratory: Negative for shortness of breath.   Cardiovascular: Negative for chest pain and leg swelling.  Neurological: Negative for syncope and light-headedness.    Patient Active Problem List   Diagnosis Date Noted  . Migraine headache with aura 02/11/2015  . Special screening for malignant neoplasms, colon   . Benign neoplasm of ascending colon   . Benign neoplasm of transverse colon   . Benign neoplasm of sigmoid colon   . Fatty liver disease, nonalcoholic 67/20/9470  . Elevated liver enzymes 12/10/2014  . Hyperlipidemia 12/10/2014  . Vitamin D deficiency 12/10/2014  . Obesity, Class III, BMI 40-49.9 (morbid obesity) ([place]) 11/21/2014  . Hypothyroidism 11/21/2014  . HTN (hypertension) 11/21/2014  . Hx of thyroid cancer 11/21/2014    Prior to Admission medications   Medication Sig Start Date End Date Taking? Authorizing Provider  calcium-vitamin D 250-100 MG-UNIT tablet Take 1 tablet by mouth 1 day or 1 dose.   Yes Historical Provider, MD  Cholecalciferol (VITAMIN D) 2000 UNITS CAPS Take 1 capsule (2,000 Units total) by mouth daily. 12/10/14  Yes Adline Potter, MD  co-enzyme Q-10 30 MG capsule Take 100  mg by mouth daily.   Yes Historical Provider, MD  levothyroxine (SYNTHROID, LEVOTHROID) 175 MCG tablet Take 1 tablet (175 mcg total) by mouth daily before breakfast. 11/21/14  Yes Adline Potter, MD  magnesium oxide (MAG-OX) 400 MG tablet Take 2 tablets by mouth daily.   Yes Historical Provider, MD  Omega-3 Fatty Acids (FISH OIL) 1200 MG CAPS Take 2 capsules by mouth daily.   Yes Historical Provider, MD  SUMAtriptan (IMITREX) 100 MG tablet Take 1 tablet (100 mg total) by mouth once. May repeat in 2 hours if headache persists or recurs. 02/11/15   Adline Potter, MD    No Known Allergies  Past Surgical History  Procedure Laterality Date  . Tonsillectomy    . Tubal ligation    . Thyroidectomy    . Colonoscopy  2000    normal  . Colonoscopy with propofol N/A 01/22/2015    Procedure: COLONOSCOPY WITH PROPOFOL;  Surgeon: Lucilla Lame, MD;  Location: [facility] ENDOSCOPY;  Service: Endoscopy;  Laterality: N/A;    Social History  Substance Use Topics  . Smoking status: Former Research scientist (life sciences)  . Smokeless tobacco: Not on file  . Alcohol Use: 0.0 oz/week    0 Standard drinks or equivalent per week    Family History  Problem Relation Age of Onset  . Hypertension Mother   . Hypertension Maternal Grandmother     Medication list has been reviewed and updated.  Physical Examination: BP 178/102 mmHg  Pulse 80  Ht 5' 8"  (1.727 m)  Wt 265 lb 6.4 oz (120.385 kg)  BMI 40.36 kg/m2  Physical Exam  Constitutional: She appears well-developed and well-nourished.  Cardiovascular: Regular rhythm and normal heart sounds.   Pulmonary/Chest: Effort normal and breath sounds normal.  Musculoskeletal: She exhibits no edema.  Neurological: She is alert.  Skin: Skin is warm and dry.  Psychiatric: She has a normal mood and affect. Her behavior is normal.  Nursing note and vitals reviewed.   Assessment and Plan:  1. Essential hypertension Marginal control, pt wishes to avoid rx medication, will continue to  exercise/lose weight, recheck one month - Comprehensive metabolic panel  2. Migraine with aura and without status migrainosus, not intractable Trial PO Imitrex, consider neuro referral if ineffective - SUMAtriptan (IMITREX) 100 MG tablet; Take 1 tablet (100 mg total) by mouth once. May repeat in 2 hours if headache persists or recurs.  Dispense: 10 tablet; Refill: 2  3. Fatty liver disease, nonalcoholic Recheck liver enzymes today  4. Postoperative hypothyroidism Well controlled, continue current Synthroid dose  5. Obesity, Class III, BMI 40-49.9 (morbid obesity) ([facility]) Cont exercise/weight loss - HgB A1c  6. Hx of thyroid cancer Followed by endo  7. Hyperlipidemia Recheck lipids today - Lipid Profile  8. Vitamin D deficiency On supplementation, recheck level - Vitamin D (25 hydroxy)  Return in about 1 month (around 03/14/2015).  Satira Anis. [facility]  02/11/2015

## 2015-03-21 ENCOUNTER — Ambulatory Visit: Payer: BLUE CROSS/BLUE SHIELD | Admitting: Family Medicine

## 2015-04-11 ENCOUNTER — Ambulatory Visit: Payer: BLUE CROSS/BLUE SHIELD | Admitting: Family Medicine

## 2015-04-17 ENCOUNTER — Ambulatory Visit: Payer: BLUE CROSS/BLUE SHIELD | Admitting: Family Medicine

## 2015-04-19 ENCOUNTER — Ambulatory Visit: Payer: BLUE CROSS/BLUE SHIELD | Admitting: Family Medicine

## 2015-09-13 ENCOUNTER — Ambulatory Visit (INDEPENDENT_AMBULATORY_CARE_PROVIDER_SITE_OTHER): Payer: BLUE CROSS/BLUE SHIELD

## 2015-09-13 ENCOUNTER — Encounter: Payer: Self-pay | Admitting: *Deleted

## 2015-09-13 ENCOUNTER — Ambulatory Visit
Admission: EM | Admit: 2015-09-13 | Discharge: 2015-09-13 | Disposition: A | Discharge status: Home / Self Care | Payer: BLUE CROSS/BLUE SHIELD | Attending: Family Medicine | Admitting: Family Medicine

## 2015-09-13 DIAGNOSIS — S80812A Abrasion, left lower leg, initial encounter: Secondary | ICD-10-CM

## 2015-09-13 DIAGNOSIS — S9032XA Contusion of left foot, initial encounter: Secondary | ICD-10-CM

## 2015-09-13 MED ORDER — TETANUS-DIPHTH-ACELL PERTUSSIS 5-2.5-18.5 LF-MCG/0.5 IM SUSP
0.5000 mL | Freq: Once | INTRAMUSCULAR | Status: AC
  Administered 2015-09-13: 0.5 mL via INTRAMUSCULAR

## 2015-09-13 NOTE — ED Notes (Signed)
Pt tripped and fell yesterday landing on left foot and lower leg. C/o left foot pain, edema, difficulty bearing weight, abrasion/avulsion to dorsal aspect. Also, abrasion to left knee and shin. Pt denies any other injuries.

## 2015-09-13 NOTE — Discharge Instructions (Signed)
Rest foot. Elevate. Apply ice.   Follow up with your primary care physician this week as needed. Return to Urgent care for new or worsening concerns.    Foot Contusion A foot contusion is a deep bruise to the foot. Contusions are the result of an injury that caused bleeding under the skin. The contusion may turn blue, purple, or yellow. Minor injuries will give you a painless contusion, but more severe contusions may stay painful and swollen for a few weeks. CAUSES  A foot contusion comes from a direct blow to that area, such as a heavy object falling on the foot. SYMPTOMS   Swelling of the foot.  Discoloration of the foot.  Tenderness or soreness of the foot. DIAGNOSIS  You will have a physical exam and will be asked about your history. You may need an X-ray of your foot to look for a broken bone (fracture).  TREATMENT  An elastic wrap may be recommended to support your foot. Resting, elevating, and applying cold compresses to your foot are often the best treatments for a foot contusion. Over-the-counter medicines may also be recommended for pain control. HOME CARE INSTRUCTIONS   Put ice on the injured area.  Put ice in a plastic bag.  Place a towel between your skin and the bag.  Leave the ice on for 15-20 minutes, 03-04 times a day.  Only take over-the-counter or prescription medicines for pain, discomfort, or fever as directed by your caregiver.  If told, use an elastic wrap as directed. This can help reduce swelling. You may remove the wrap for sleeping, showering, and bathing. If your toes become numb, cold, or blue, take the wrap off and reapply it more loosely.  Elevate your foot with pillows to reduce swelling.  Try to avoid standing or walking while the foot is painful. Do not resume use until instructed by your caregiver. Then, begin use gradually. If pain develops, decrease use. Gradually increase activities that do not cause discomfort until you have normal use of  your foot.  See your caregiver as directed. It is very important to keep all follow-up appointments in order to avoid any lasting problems with your foot, including long-term (chronic) pain. SEEK IMMEDIATE MEDICAL CARE IF:   You have increased redness, swelling, or pain in your foot.  Your swelling or pain is not relieved with medicines.  You have loss of feeling in your foot or are unable to move your toes.  Your foot turns cold or blue.  You have pain when you move your toes.  Your foot becomes warm to the touch.  Your contusion does not improve in 2 days. MAKE SURE YOU:   Understand these instructions.  Will watch your condition.  Will get help right away if you are not doing well or get worse.   This information is not intended to replace advice given to you by your health care provider. Make sure you discuss any questions you have with your health care provider.   Document Released: 12/01/2005 Document Revised: 08/11/2011 Document Reviewed: 10/16/2014 Elsevier Interactive Patient Education 2016 [place] An abrasion is a cut or scrape on the outer surface of your skin. An abrasion does not extend through all of the layers of your skin. It is important to care for your abrasion properly to prevent infection. CAUSES Most abrasions are caused by falling on or gliding across the ground or another surface. When your skin rubs on something, the outer and inner layer of  skin rubs off.  SYMPTOMS A cut or scrape is the main symptom of this condition. The scrape may be bleeding, or it may appear red or pink. If there was an associated fall, there may be an underlying bruise. DIAGNOSIS An abrasion is diagnosed with a physical exam. TREATMENT Treatment for this condition depends on how large and deep the abrasion is. Usually, your abrasion will be cleaned with water and mild soap. This removes any dirt or debris that may be stuck. An antibiotic ointment may be applied  to the abrasion to help prevent infection. A bandage (dressing) may be placed on the abrasion to keep it clean. You may also need a tetanus shot. HOME CARE INSTRUCTIONS Medicines  Take or apply medicines only as directed by your health care provider.  If you were prescribed an antibiotic ointment, finish all of it even if you start to feel better. Wound Care  Clean the wound with mild soap and water 2-3 times per day or as directed by your health care provider. Pat your wound dry with a clean towel. Do not rub it.  There are many different ways to close and cover a wound. Follow instructions from your health care provider about:  Wound care.  Dressing changes and removal.  Check your wound every day for signs of infection. Watch for:  Redness, swelling, or pain.  Fluid, blood, or pus. General Instructions  Keep the dressing dry as directed by your health care provider. Do not take baths, swim, use a hot tub, or do anything that would put your wound underwater until your health care provider approves.  If there is swelling, raise (elevate) the injured area above the level of your heart while you are sitting or lying down.  Keep all follow-up visits as directed by your health care provider. This is important. SEEK MEDICAL CARE IF:  You received a tetanus shot and you have swelling, severe pain, redness, or bleeding at the injection site.  Your pain is not controlled with medicine.  You have increased redness, swelling, or pain at the site of your wound. SEEK IMMEDIATE MEDICAL CARE IF:  You have a red streak going away from your wound.  You have a fever.  You have fluid, blood, or pus coming from your wound.  You notice a bad smell coming from your wound or your dressing.   This information is not intended to replace advice given to you by your health care provider. Make sure you discuss any questions you have with your health care provider.   Document Released:  11/19/2004 Document Revised: 10/31/2014 Document Reviewed: 02/07/2014 Elsevier Interactive Patient Education [employer].

## 2015-09-13 NOTE — ED Provider Notes (Signed)
[facility]  ____________________________________________  Time seen: Approximately 2:09 PM  I have reviewed the triage vital signs and the nursing notes.   HISTORY  Chief Complaint Foot Injury   HPI Miranda Delacruz is a 55 y.o. female patient presents with mother that for the complaint of left foot pain. Patient states yesterday evening they were walking outside. Patient states that she has stepping stones that are slightly raised and reports that she stepped on the edge and then rolled her left foot causing pain. Patient reports that she then fell directly on her left foot and left leg. Patient states pain only to left foot at the base of the second and third toe. Reports some pain with ambulation but remains ambulatory. Denies numbness or tingling sensation. Denies pain radiation. Denies head injury or loss consciousness. Denies any other pain or injury.  Denies fevers. Denies chest pain or shortness of breath, abdominal pain, dysuria or weakness. Patient reports chronic hypertension in which she states is following with PCP and monitoring, states trying to lose weight. Unsure of last tetanus immunization.  Past Medical History  Diagnosis Date  . Thyroid disease   . Hypertension   . Cancer [facility])     Patient Active Problem List   Diagnosis Date Noted  . Migraine headache with aura 02/11/2015  . Special screening for malignant neoplasms, colon   . Benign neoplasm of ascending colon   . Benign neoplasm of transverse colon   . Benign neoplasm of sigmoid colon   . Fatty liver disease, nonalcoholic 71/69/6789  . Elevated liver enzymes 12/10/2014  . Hyperlipidemia 12/10/2014  . Vitamin D deficiency 12/10/2014  . Obesity, Class III, BMI 40-49.9 (morbid obesity) ([place]) 11/21/2014  . Hypothyroidism 11/21/2014  . HTN (hypertension) 11/21/2014  . Hx of thyroid cancer 11/21/2014    Past Surgical History  Procedure Laterality Date  . Tonsillectomy    . Tubal ligation    .  Thyroidectomy    . Colonoscopy  2000    normal  . Colonoscopy with propofol N/A 01/22/2015    Procedure: COLONOSCOPY WITH PROPOFOL;  Surgeon: Lucilla Lame, MD;  Location: [facility] ENDOSCOPY;  Service: Endoscopy;  Laterality: N/A;    Current Outpatient Rx  Name  Route  Sig  Dispense  Refill  . calcium-vitamin D 250-100 MG-UNIT tablet   Oral   Take 1 tablet by mouth 1 day or 1 dose.         . Cholecalciferol (VITAMIN D) 2000 UNITS CAPS   Oral   Take 1 capsule (2,000 Units total) by mouth daily.   30 capsule      . co-enzyme Q-10 30 MG capsule   Oral   Take 100 mg by mouth daily.         Marland Kitchen levothyroxine (SYNTHROID, LEVOTHROID) 175 MCG tablet   Oral   Take 1 tablet (175 mcg total) by mouth daily before breakfast.   90 tablet   3   . magnesium oxide (MAG-OX) 400 MG tablet   Oral   Take 2 tablets by mouth daily.         . Omega-3 Fatty Acids (FISH OIL) 1200 MG CAPS   Oral   Take 2 capsules by mouth daily.         . SUMAtriptan (IMITREX) 100 MG tablet   Oral   Take 1 tablet (100 mg total) by mouth once. May repeat in 2 hours if headache persists or recurs.   10 tablet   2  Allergies Review of patient's allergies indicates no known allergies.  Family History  Problem Relation Age of Onset  . Hypertension Mother   . Hypertension Maternal Grandmother     Social History Social History  Substance Use Topics  . Smoking status: Former Research scientist (life sciences)  . Smokeless tobacco: None  . Alcohol Use: 0.0 oz/week    0 Standard drinks or equivalent per week    Review of Systems Constitutional: No fever/chills Eyes: No visual changes. ENT: No sore throat. Cardiovascular: Denies chest pain. Respiratory: Denies shortness of breath. Gastrointestinal: No abdominal pain.  No nausea, no vomiting.  No diarrhea.  No constipation. Genitourinary: Negative for dysuria. Musculoskeletal: Negative for back pain.Positive left foot pain. Skin: Negative for rash. Neurological: Negative  for headaches, focal weakness or numbness.  10-point ROS otherwise negative.  ____________________________________________   PHYSICAL EXAM:  VITAL SIGNS: ED Triage Vitals  Enc Vitals Group     BP 09/13/15 1323 177/117 mmHg     Pulse Rate 09/13/15 1323 75     Resp 09/13/15 1323 16     Temp 09/13/15 1323 98.2 F (36.8 C)     Temp Source 09/13/15 1323 Oral     SpO2 09/13/15 1323 99 %     Weight 09/13/15 1323 265 lb (120.203 kg)     Height 09/13/15 1323 5' 8"  (1.727 m)     Head Cir --      Peak Flow --      Pain Score 09/13/15 1331 3     Pain Loc --      Pain Edu? --      Excl. in [place]? --    Today's Vitals   09/13/15 1328 09/13/15 1331 09/13/15 1433 09/13/15 1500  BP: 192/97  186/90   Pulse:   71   Temp:      TempSrc:      Resp:      Height:      Weight:      SpO2:      PainSc:  3   3       Constitutional: Alert and oriented. Well appearing and in no acute distress. Eyes: Conjunctivae are normal. PERRL. EOMI. Head: Atraumatic.  Ears: Normal external appearance bilaterally.  Nose: No congestion/rhinnorhea.  Mouth/Throat: Mucous membranes are moist.  Oropharynx non-erythematous. Neck: No stridor.  No cervical spine tenderness to palpation. Cardiovascular: Normal rate, regular rhythm. Grossly normal heart sounds.  Good peripheral circulation. Respiratory: Normal respiratory effort.  No retractions. Lungs CTAB. No wheezes, rales or rhonchi. Gastrointestinal: Soft and nontender. Morbidly obese abdomen. Musculoskeletal: No lower or upper extremity tenderness nor edema. Bilateral pedal pulses equal and easily palpated. Except: Left dorsal foot at the distal first second and third metatarsals mild to moderate tenderness to palpation with mild swelling, minimal ecchymosis, left foot otherwise nontender, normal capillary refill, full range of motion, sensation intact to left foot. Left lower extremity otherwise nontender. No calf tenderness bilaterally. Left knee  nontender. Neurologic:  Normal speech and language. No gross focal neurologic deficits are appreciated.  Skin:  Skin is warm, dry and intact. No rash noted. Except: Superficial abrasions to left anterior knee and left dorsal foot without surrounding erythema, drainage, foreign body or tenderness. Psychiatric: Mood and affect are normal. Speech and behavior are normal.  ____________________________________________    LABS (all labs ordered are listed, but only abnormal results are displayed)  Labs Reviewed - No data to display __RADIOLOGY  Dg Foot Complete Left  09/13/2015  CLINICAL DATA:  Initial encounter  for Pt with left anterior/distal metatarsal of the 1-3 of the left foot yesterday. No hx of previous trauma to the left foot. Hx of multiple sprains to the left ankle. EXAM: LEFT FOOT - COMPLETE 3+ VIEW COMPARISON:  None. FINDINGS: Dorsal soft tissue swelling about the midfoot and forefoot on the lateral view. Tibiotalar osteoarthritis. No acute fracture or dislocation. IMPRESSION: Soft tissue swelling, without acute osseous abnormality. Electronically Signed   By: Abigail Miyamoto M.D.   On: 09/13/2015 14:26   I, Marylene Land, personally viewed and evaluated these images (plain radiographs) as part of my medical decision making, as well as reviewing the written report by the radiologist.  ____________________________________________   PROCEDURES  Procedure(s) performed: Postoperative shoe applied by RN. Neurovascular intact is a patient. ____________________________________________   INITIAL IMPRESSION / ASSESSMENT AND PLAN / ED COURSE  Pertinent labs & imaging results that were available during my care of the patient were reviewed by me and considered in my medical decision making (see chart for details).  Overall well-appearing patient. Patient is morbidly obese. Patient had a mechanical fall yesterday evening with positive for left foot injury and multiple superficial abrasions.  Tetanus immunization updated. Will evaluate left foot x-ray. Left lower extremity otherwise nontender.  Per radiologist left foot soft tissue swelling, without acute osseous abnormality. Discussed with patient and spouse. Encouraged supportive treatments. Encouraged rest, ice, elevation. Postoperative shoe applied by RN. Patient declines need for crutches or walker. Declines need for additional medication. Counseled regarding close monitoring of blood pressure at home and PCP follow-up in the next few days and stressed importance of this, declines further evaluation at this time. Patient agrees to this and states will follow up.    Discussed follow up with Primary care physician this week. Discussed follow up and return parameters including no resolution or any worsening concerns. Patient verbalized understanding and agreed to plan.   ____________________________________________   FINAL CLINICAL IMPRESSION(S) / ED DIAGNOSES  Final diagnoses:  Foot contusion, left, initial encounter  Leg abrasion, left, initial encounter     Discharge Medication List as of 09/13/2015  2:38 PM      Note: This dictation was prepared with Dragon dictation along with smaller phrase technology. Any transcriptional errors that result from this process are unintentional.       Marylene Land, NP 09/13/15 2136

## 2015-09-25 ENCOUNTER — Encounter: Payer: Self-pay | Admitting: Family Medicine

## 2015-09-25 ENCOUNTER — Telehealth: Payer: Self-pay

## 2015-09-25 ENCOUNTER — Ambulatory Visit (INDEPENDENT_AMBULATORY_CARE_PROVIDER_SITE_OTHER): Payer: BLUE CROSS/BLUE SHIELD | Admitting: Family Medicine

## 2015-09-25 VITALS — BP 170/111 | HR 88 | Ht 68.0 in | Wt 266.0 lb

## 2015-09-25 DIAGNOSIS — I10 Essential (primary) hypertension: Secondary | ICD-10-CM | POA: Diagnosis not present

## 2015-09-25 DIAGNOSIS — G43101 Migraine with aura, not intractable, with status migrainosus: Secondary | ICD-10-CM

## 2015-09-25 DIAGNOSIS — Z85850 Personal history of malignant neoplasm of thyroid: Secondary | ICD-10-CM | POA: Diagnosis not present

## 2015-09-25 DIAGNOSIS — Z780 Asymptomatic menopausal state: Secondary | ICD-10-CM | POA: Insufficient documentation

## 2015-09-25 MED ORDER — METOPROLOL SUCCINATE ER 50 MG PO TB24: 50.0000 mg | ORAL_TABLET | Freq: Every day | ORAL | 0 refills | Status: DC

## 2015-09-25 MED ORDER — ATENOLOL 25 MG PO TABS: 25.0000 mg | ORAL_TABLET | Freq: Every day | ORAL | 0 refills | Status: DC

## 2015-09-25 NOTE — Assessment & Plan Note (Signed)
>>  ASSESSMENT AND PLAN FOR HTN (HYPERTENSION) WRITTEN ON 09/25/2015  9:59 AM BY OPALSKI, DEBORAH, DO  Pt states BP was controlled in remote past due to lifestyle changes, but over past one year- has been very poorly controlled.

## 2015-09-25 NOTE — Assessment & Plan Note (Signed)
Pt states BP was controlled in remote past due to lifestyle changes, but over past one year- has been very poorly controlled.

## 2015-09-25 NOTE — Progress Notes (Signed)
New patient office visit note:  Impression and Recommendations:    1. Essential hypertension   2. Migraine with aura and with status migrainosus, not intractable   3. Hx of papillary thyroid cancer   4. Morbid obesity, unspecified obesity type ([place])     Poorly controlled BP.  Start atenolol patient which she has used in the past.  She will monitor her blood pressure at home with her home blood pressure monitor and keep a log.  Handouts provided.  Patient has Imitrex to use as needed but I'm hoping that this blood pressure medicine will also help with her migraines.  Near future we'll obtain fasting blood work and check her TSH as well as cholesterol levels etc.  Briefly d/c pt wt loss and how it would greatly impact her BP/ overall health  Patient's Medications  New Prescriptions   ATENOLOL (TENORMIN) 25 MG TABLET    Take 1 tablet (25 mg total) by mouth daily.  Previous Medications   CALCIUM-VITAMIN D 250-100 MG-UNIT TABLET    Take 1 tablet by mouth 1 day or 1 dose.   CHOLECALCIFEROL (VITAMIN D) 2000 UNITS CAPS    Take 1 capsule (2,000 Units total) by mouth daily.   CO-ENZYME Q-10 30 MG CAPSULE    Take 100 mg by mouth daily.   LEVOTHYROXINE (SYNTHROID, LEVOTHROID) 175 MCG TABLET    Take 1 tablet (175 mcg total) by mouth daily before breakfast.   MAGNESIUM OXIDE (MAG-OX) 400 MG TABLET    Take 2 tablets by mouth daily.   MILK THISTLE 175 MG TABLET    Take 175 mg by mouth daily.   OMEGA-3 FATTY ACIDS (FISH OIL) 1200 MG CAPS    Take 2 capsules by mouth daily.   SUMATRIPTAN (IMITREX) 100 MG TABLET    Take 1 tablet (100 mg total) by mouth once. May repeat in 2 hours if headache persists or recurs.  Modified Medications   No medications on file  Discontinued Medications   No medications on file    Return in about 3 weeks (around 10/16/2015) for come in for fasting bloodwork and then f/up OV for BP ( startting new med).  The patient was counseled, risk factors were  discussed, anticipatory guidance given.  Gross side effects, risk and benefits, and alternatives of medications discussed with patient.  Patient is aware that all medications have potential side effects and we are unable to predict every side effect or drug-drug interaction that may occur.  Expresses verbal understanding and consents to current therapy plan and treatment regimen.  Please see AVS handed out to patient at the end of our visit for further patient instructions/ counseling done pertaining to today's office visit.    Note: This document was prepared using Dragon voice recognition software and may include unintentional dictation errors.  ----------------------------------------------------------------------------------------------------------------------    Subjective:    Chief Complaint  Patient presents with  . Establish Care    HPI: Miranda Delacruz is a pleasant 55 y.o. female who presents to [place] at [facility] today to review their medical history with me and establish care.   Moved from [place] one yr ago.  Was seeing Dr Ward Givens in [facility].   Hasn't seen him in a while.   Looking for a job currently.    Patient with a history of smoking for only 3 years at 1 pack per day- quit 1989  Has treadmill at home- not using it  HTN: running at home-     170-123  / 111-83.  HA's occ.    Dec 2016: note from [place]:  HTN, fatty liver, HLD, vit D def. Saw endo re: hx thyroid ca, no new issues. Had colonoscopy with 3 precancerous polyps, f/u in 5 yrs recommended.  Saw ENT last week, received Zpak for sinusitis, improved. Has lost 5# with better diet, more exercise.  Prefers to keep taking fish oil, calcium, magnesium, and co-Q10 supplements (says eats little dairy).  Also c/o migraines with aura since age 89, 3 episodes past 6 months, previously responded to Midrin, wants to try oral Imitrex.    Patient Active Problem List   Diagnosis Date Noted  . Menopause  present 09/25/2015  . Migraine headache with aura 02/11/2015  . Special screening for malignant neoplasms, colon   . Benign neoplasm of ascending colon   . Benign neoplasm of transverse colon   . Benign neoplasm of sigmoid colon   . Fatty liver disease, nonalcoholic 96/22/2979  . Elevated liver enzymes 12/10/2014  . Hyperlipidemia 12/10/2014  . Vitamin D deficiency 12/10/2014  . Obesity, Class III, BMI 40-49.9 (morbid obesity) ([place]) 11/21/2014  . Hypothyroidism 11/21/2014  . HTN (hypertension) 11/21/2014  . Hx of papillary thyroid cancer 11/21/2014     Past Medical History:  Diagnosis Date  . Cancer ([place])   . Hyperlipidemia   . Hypertension   . Thyroid disease      Past Surgical History:  Procedure Laterality Date  . COLONOSCOPY  2000   normal  . COLONOSCOPY WITH PROPOFOL N/A 01/22/2015   Procedure: COLONOSCOPY WITH PROPOFOL;  Surgeon: Lucilla Lame, MD;  Location: [facility] ENDOSCOPY;  Service: Endoscopy;  Laterality: N/A;  . THYROIDECTOMY    . TONSILLECTOMY    . TUBAL LIGATION       Family History  Problem Relation Age of Onset  . Hypertension Mother   . Hypertension Maternal Grandmother   . Stroke Maternal Grandmother      History  Drug Use No    History  Alcohol Use  . 0.0 oz/week    History  Smoking Status  . Former Smoker  . Quit date: 02/24/1987  Smokeless Tobacco  . Never Used    Patient's Medications  New Prescriptions   ATENOLOL (TENORMIN) 25 MG TABLET    Take 1 tablet (25 mg total) by mouth daily.  Previous Medications   CALCIUM-VITAMIN D 250-100 MG-UNIT TABLET    Take 1 tablet by mouth 1 day or 1 dose.   CHOLECALCIFEROL (VITAMIN D) 2000 UNITS CAPS    Take 1 capsule (2,000 Units total) by mouth daily.   CO-ENZYME Q-10 30 MG CAPSULE    Take 100 mg by mouth daily.   LEVOTHYROXINE (SYNTHROID, LEVOTHROID) 175 MCG TABLET    Take 1 tablet (175 mcg total) by mouth daily before breakfast.   MAGNESIUM OXIDE (MAG-OX) 400 MG TABLET    Take 2 tablets by  mouth daily.   MILK THISTLE 175 MG TABLET    Take 175 mg by mouth daily.   OMEGA-3 FATTY ACIDS (FISH OIL) 1200 MG CAPS    Take 2 capsules by mouth daily.   SUMATRIPTAN (IMITREX) 100 MG TABLET    Take 1 tablet (100 mg total) by mouth once. May repeat in 2 hours if headache persists or recurs.  Modified Medications   No medications on file  Discontinued Medications   No medications on file    Allergies: Review of patient's allergies indicates no known allergies.  Review of Systems:   ( Completed via Adult Medical History Intake form today ) General:  Denies fever, chills, appetite changes, unexplained weight loss.  Optho/Auditory:   Denies visual changes, blurred vision/LOV, ringing in ears/ diff hearing Respiratory:   Denies SOB, DOE, cough, wheezing.  Cardiovascular:   Denies chest pain, palpitations, new onset peripheral edema  Gastrointestinal:   Denies nausea, vomiting, diarrhea.  Genitourinary:    Denies dysuria, increased frequency, flank pain.  Endocrine:     Denies hot or cold intolerance, polyuria, polydipsia. Musculoskeletal:  Denies unexplained myalgias, joint swelling, arthralgias, gait problems.  Skin:  Denies rash, suspicious lesions or new/ changes in moles Neurological:    Denies dizziness, syncope, unexplained weakness, lightheadedness, numbness  Psychiatric/Behavioral:   Denies mood changes, suicidal or homicidal ideations, hallucinations    Objective:   Blood pressure (!) 170/111, pulse 88, height 5' 8"  (1.727 m), weight 266 lb (120.7 kg). Body mass index is 40.45 kg/m.   General: Well Developed, well nourished, and in no acute distress.  Neuro: Alert and oriented x3, extra-ocular muscles intact, sensation grossly intact.  HEENT: Normocephalic, atraumatic, pupils equal round reactive to light, neck supple, no gross masses, no carotid bruits, no JVD apprec Skin: no gross suspicious lesions or rashes  Cardiac: Regular rate and rhythm, no murmurs rubs or gallops.    Respiratory: Essentially clear to auscultation bilaterally. Not using accessory muscles, speaking in full sentences.  Abdominal: Soft, not grossly distended Musculoskeletal: Ambulates w/o diff, FROM * 4 ext.  Vasc: less 2 sec cap RF, warm and pink  Psych:  No HI/SI, judgement and insight good.

## 2015-09-25 NOTE — Telephone Encounter (Signed)
Sent medication to the pharmacy. Called patient and advised her to schedule a follow up appointment for 28 days from now to follow up on blood pressure. To ensure medication is working.

## 2015-09-25 NOTE — Patient Instructions (Addendum)
[email or web address].[email or web address] American Heart Association recommends 30 minutes of moderate intensity aerobic activity at least 5 days a week; then do wt training 2 days.  Check your BP at home.       Exercising to Lose Weight Exercising can help you to lose weight. In order to lose weight through exercise, you need to do vigorous-intensity exercise. You can tell that you are exercising with vigorous intensity if you are breathing very hard and fast and cannot hold a conversation while exercising. Moderate-intensity exercise helps to maintain your current weight. You can tell that you are exercising at a moderate level if you have a higher heart rate and faster breathing, but you are still able to hold a conversation. HOW OFTEN SHOULD I EXERCISE? Choose an activity that you enjoy and set realistic goals. Your health care provider can help you to make an activity plan that works for you. Exercise regularly as directed by your health care provider. This may include:  Doing resistance training twice each week, such as:  Push-ups.  Sit-ups.  Lifting weights.  Using resistance bands.  Doing a given intensity of exercise for a given amount of time. Choose from these options:  150 minutes of moderate-intensity exercise every week.  75 minutes of vigorous-intensity exercise every week.  A mix of moderate-intensity and vigorous-intensity exercise every week. Children, pregnant women, people who are out of shape, people who are overweight, and older adults may need to consult a health care provider for individual recommendations. If you have any sort of medical condition, be sure to consult your health care provider before starting a new exercise program. WHAT ARE SOME ACTIVITIES THAT CAN HELP ME TO LOSE WEIGHT?   Walking at a rate of at least 4.5 miles an hour.  Jogging or running at a rate of 5 miles per hour.  Biking at a rate of at least 10 miles per hour.  Lap swimming.  Roller-skating or  in-line skating.  Cross-country skiing.  Vigorous competitive sports, such as football, basketball, and soccer.  Jumping rope.  Aerobic dancing. HOW CAN I BE MORE ACTIVE IN MY DAY-TO-DAY ACTIVITIES?  Use the stairs instead of the elevator.  Take a walk during your lunch break.  If you drive, park your car farther away from work or school.  If you take public transportation, get off one stop early and walk the rest of the way.  Make all of your phone calls while standing up and walking around.  Get up, stretch, and walk around every 30 minutes throughout the day. WHAT GUIDELINES SHOULD I FOLLOW WHILE EXERCISING?  Do not exercise so much that you hurt yourself, feel dizzy, or get very short of breath.  Consult your health care provider prior to starting a new exercise program.  Wear comfortable clothes and shoes with good support.  Drink plenty of water while you exercise to prevent dehydration or heat stroke. Body water is lost during exercise and must be replaced.  Work out until you breathe faster and your heart beats faster.   This information is not intended to replace advice given to you by your health care provider. Make sure you discuss any questions you have with your health care provider.   Document Released: 03/14/2010 Document Revised: 03/02/2014 Document Reviewed: 07/13/2013 Elsevier Interactive Patient Education 2016 Colon for a Low Sodium Diet   Low Sodium Diet A main source of sodium is table salt. The average American eats five or more teaspoons  of salt each day. This is about 20 times as much as the body needs. In fact, your body needs only 1/4 teaspoon of salt every day. Sodium is found naturally in foods, but a lot of it is added during processing and preparation. Many foods that do not taste salty may still be high in sodium. Large amounts of sodium can be hidden in canned, processed and convenience foods. And sodium can be found  in many foods that are served at [employer].  Sodium controls fluid balance in our bodies and maintains blood volume and blood pressure. Eating too much sodium may raise blood pressure and cause fluid retention, which could lead to swelling of the legs and feet or other health issues.  When limiting sodium in your diet, a common target is to eat less than 2,000 milligrams of sodium per day.   General Guidelines for Cutting Down on Salt Eliminate salty foods from your diet and reduce the amount of salt used in cooking. Sea salt is no better than regular salt.  Choose low sodium foods. Many salt-free or reduced salt products are available. When reading food labels, low sodium is defined as 140 mg of sodium per serving.  Salt substitutes are sometimes made from potassium, so read the label. If you are on a low potassium diet, then check with your doctor before using those salt substitutes.  Be creative and season your foods with spices, herbs, lemon, garlic, ginger, vinegar and pepper. Remove the salt shaker from the table.  Read ingredient labels to identify foods high in sodium. Items with 400 mg or more of sodium are high in sodium. High sodium food additives include salt, brine, or other items that say sodium, such as monosodium glutamate.  Eat more home-cooked meals. Foods cooked from scratch are naturally lower in sodium than most instant and boxed mixes.  Don't use softened water for cooking and drinking since it contains added salt.  Avoid medications which contain sodium such as [employer].  For more information; food composition books are available which tell how much sodium is in food. Online sources such as www.calorieking.com also list amounts.     Meats, Poultry, Fish, Legumes, Eggs and Nuts  High-Sodium Foods: Smoked, cured, salted or canned meat, fish or poultry including bacon, cold cuts, ham, frankfurters, sausage, sardines, caviar and  anchovies Frozen breaded meats and dinners, such as burritos and pizza Canned entrees, such as ravioli, spam and chili Salted nuts Beans canned with salt added  Low-Sodium Alternatives: Any fresh or frozen beef, lamb, pork, poultry and fish Eggs and egg substitutes Low-sodium peanut butter Dry peas and beans (not canned) Low-sodium canned fish Drained, water or oil packed canned fish or poultry   Dairy Products  High-Sodium Foods: Buttermilk Regular and processed cheese, cheese spreads and sauces Cottage cheese  Low-Sodium Alternatives: Milk, yogurt, ice cream and ice milk Low-sodium cheeses, cream cheese, ricotta cheese and mozzarella   Breads, Grains and Cereals  High-Sodium Foods: Bread and rolls with salted tops Quick breads, self-rising flour, biscuit, pancake and waffle mixes Pizza, croutons and salted crackers Prepackaged, processed mixes for potatoes, rice, pasta and stuffing  Low-Sodium Alternatives: Breads, bagels and rolls without salted tops Muffins and most ready-to-eat cereals All rice and pasta, but do not to add salt when cooking Low-sodium corn and flour tortillas and noodles Low-sodium crackers and breadsticks Unsalted popcorn, chips and pretzels      Vegetables and Fruits  High-Sodium Foods: Regular canned  vegetables and vegetable juices Olives, pickles, sauerkraut and other pickled vegetables Vegetables made with ham, bacon or salted pork Packaged mixes, such as scalloped or au gratin potatoes, frozen hash browns and Tater Tots Commercially prepared pasta and tomato sauces and salsa  Low-Sodium Alternatives: Fresh and frozen vegetables without sauces Low-sodium canned vegetables, sauces and juices Fresh potatoes, frozen [place] fries and instant mashed potatoes Low-salt tomato or V-8 juice. Most fresh, frozen and canned fruit Dried fruits   Soups  High-Sodium Foods: Regular canned and dehydrated soup, broth and bouillon Cup of  noodles and seasoned ramen mixes  Low-Sodium Alternatives: Low-sodium canned and dehydrated soups, broth and bouillon Homemade soups without added salt   Fats, Desserts and Sweets  High-Sodium Foods: Soy sauce, seasoning salt, other sauces and marinades Bottled salad dressings, regular salad dressing with bacon bits Salted butter or margarine Instant pudding and cake Large portions of ketchup, mustard  Low-Sodium Alternatives: Vinegar, unsalted butter or margarine Vegetable oils and low sodium sauces and salad dressings Mayonnaise All desserts made without salt

## 2015-09-25 NOTE — Telephone Encounter (Signed)
Then we will switch her to:  Toprol-XL 50 mg 1 po qd Dispense 30  no refills

## 2015-09-25 NOTE — Telephone Encounter (Signed)
Pt called stating that the atenolol that you prescribed today is on national backorder for the manufacturer.  Pt would like an alternative medication sent in so that she can go ahead and get started on something.  Please advise.  Charyl Bigger, CMA

## 2015-10-07 ENCOUNTER — Other Ambulatory Visit: Payer: Self-pay | Admitting: *Deleted

## 2015-10-15 ENCOUNTER — Other Ambulatory Visit (INDEPENDENT_AMBULATORY_CARE_PROVIDER_SITE_OTHER): Payer: BLUE CROSS/BLUE SHIELD

## 2015-10-15 ENCOUNTER — Other Ambulatory Visit: Payer: Self-pay

## 2015-10-15 DIAGNOSIS — E669 Obesity, unspecified: Secondary | ICD-10-CM

## 2015-10-15 DIAGNOSIS — I10 Essential (primary) hypertension: Secondary | ICD-10-CM

## 2015-10-15 DIAGNOSIS — K760 Fatty (change of) liver, not elsewhere classified: Secondary | ICD-10-CM

## 2015-10-15 DIAGNOSIS — E785 Hyperlipidemia, unspecified: Secondary | ICD-10-CM

## 2015-10-15 DIAGNOSIS — E890 Postprocedural hypothyroidism: Secondary | ICD-10-CM

## 2015-10-15 DIAGNOSIS — E559 Vitamin D deficiency, unspecified: Secondary | ICD-10-CM

## 2015-10-16 LAB — CBC WITH DIFFERENTIAL/PLATELET
BASOS ABS: 0 {cells}/uL (ref 0–200)
Basophils Relative: 0 %
EOS ABS: 80 {cells}/uL (ref 15–500)
EOS PCT: 2 %
HCT: 44 % (ref 35.0–45.0)
HEMOGLOBIN: 15.1 g/dL (ref 11.7–15.5)
LYMPHS ABS: 1480 {cells}/uL (ref 850–3900)
Lymphocytes Relative: 37 %
MCH: 31.5 pg (ref 27.0–33.0)
MCHC: 34.3 g/dL (ref 32.0–36.0)
MCV: 91.7 fL (ref 80.0–100.0)
MPV: 10.4 fL (ref 7.5–12.5)
Monocytes Absolute: 240 cells/uL (ref 200–950)
Monocytes Relative: 6 %
NEUTROS PCT: 55 %
Neutro Abs: 2200 cells/uL (ref 1500–7800)
Platelets: 218 10*3/uL (ref 140–400)
RBC: 4.8 MIL/uL (ref 3.80–5.10)
RDW: 13.8 % (ref 11.0–15.0)
WBC: 4 10*3/uL (ref 3.8–10.8)

## 2015-10-16 LAB — COMPREHENSIVE METABOLIC PANEL
ALBUMIN: 4.8 g/dL (ref 3.6–5.1)
ALK PHOS: 72 U/L (ref 33–130)
ALT: 144 U/L — ABNORMAL HIGH (ref 6–29)
AST: 106 U/L — ABNORMAL HIGH (ref 10–35)
BUN: 9 mg/dL (ref 7–25)
CALCIUM: 9.6 mg/dL (ref 8.6–10.4)
CHLORIDE: 102 mmol/L (ref 98–110)
CO2: 27 mmol/L (ref 20–31)
Creat: 0.84 mg/dL (ref 0.50–1.05)
Glucose, Bld: 207 mg/dL — ABNORMAL HIGH (ref 65–99)
POTASSIUM: 4.4 mmol/L (ref 3.5–5.3)
SODIUM: 139 mmol/L (ref 135–146)
TOTAL PROTEIN: 6.7 g/dL (ref 6.1–8.1)
Total Bilirubin: 1.2 mg/dL (ref 0.2–1.2)

## 2015-10-16 LAB — LIPID PANEL
CHOLESTEROL: 234 mg/dL — AB (ref 125–200)
HDL: 30 mg/dL — ABNORMAL LOW (ref >=46)
LDL Cholesterol: 133 mg/dL — ABNORMAL HIGH (ref <130)
Total CHOL/HDL Ratio: 7.8 Ratio — ABNORMAL HIGH (ref <=5.0)
Triglycerides: 357 mg/dL — ABNORMAL HIGH (ref <150)
VLDL: 71 mg/dL — ABNORMAL HIGH (ref <30)

## 2015-10-16 LAB — TSH: TSH: 2.33 m[IU]/L

## 2015-10-16 LAB — HEMOGLOBIN A1C
HEMOGLOBIN A1C: 6.2 % — AB (ref <5.7)
Mean Plasma Glucose: 131 mg/dL

## 2015-10-16 LAB — VITAMIN D 25 HYDROXY (VIT D DEFICIENCY, FRACTURES): Vit D, 25-Hydroxy: 38 ng/mL (ref 30–100)

## 2015-10-22 ENCOUNTER — Ambulatory Visit: Payer: BLUE CROSS/BLUE SHIELD | Admitting: Family Medicine

## 2015-10-23 ENCOUNTER — Ambulatory Visit: Payer: BLUE CROSS/BLUE SHIELD | Admitting: Family Medicine

## 2015-10-24 ENCOUNTER — Encounter: Payer: Self-pay | Admitting: Family Medicine

## 2015-10-24 ENCOUNTER — Ambulatory Visit (INDEPENDENT_AMBULATORY_CARE_PROVIDER_SITE_OTHER): Payer: BLUE CROSS/BLUE SHIELD | Admitting: Family Medicine

## 2015-10-24 VITALS — BP 157/103 | HR 89 | Ht 68.0 in | Wt 263.1 lb

## 2015-10-24 DIAGNOSIS — E559 Vitamin D deficiency, unspecified: Secondary | ICD-10-CM

## 2015-10-24 DIAGNOSIS — E038 Other specified hypothyroidism: Secondary | ICD-10-CM

## 2015-10-24 DIAGNOSIS — K760 Fatty (change of) liver, not elsewhere classified: Secondary | ICD-10-CM | POA: Diagnosis not present

## 2015-10-24 DIAGNOSIS — R748 Abnormal levels of other serum enzymes: Secondary | ICD-10-CM

## 2015-10-24 DIAGNOSIS — E785 Hyperlipidemia, unspecified: Secondary | ICD-10-CM

## 2015-10-24 DIAGNOSIS — E66813 Obesity, class 3: Secondary | ICD-10-CM

## 2015-10-24 DIAGNOSIS — I10 Essential (primary) hypertension: Secondary | ICD-10-CM | POA: Diagnosis not present

## 2015-10-24 DIAGNOSIS — R7303 Prediabetes: Secondary | ICD-10-CM

## 2015-10-24 DIAGNOSIS — Z85850 Personal history of malignant neoplasm of thyroid: Secondary | ICD-10-CM

## 2015-10-24 DIAGNOSIS — G43109 Migraine with aura, not intractable, without status migrainosus: Secondary | ICD-10-CM

## 2015-10-24 MED ORDER — METOPROLOL SUCCINATE ER 100 MG PO TB24: ORAL_TABLET | ORAL | 0 refills | Status: DC

## 2015-10-24 MED ORDER — VITAMIN D 50 MCG (2000 UT) PO CAPS: 3.0000 | ORAL_CAPSULE | Freq: Every day | ORAL | Status: DC

## 2015-10-24 NOTE — Assessment & Plan Note (Signed)
Blood pressure still not optimal. Patient will increase metoprolol XL to 100 mg daily. She will continue to monitor at home and bring in a blood pressure log at next office visit.

## 2015-10-24 NOTE — Assessment & Plan Note (Addendum)
>>  ASSESSMENT AND PLAN FOR HYPERLIPIDEMIA WRITTEN ON 10/24/2015 12:23 PM BY Kelden Lavallee, DO  > 7.5% 10 yr risk--> I rec statins, but we will try intensive dietary lifestyle modification for 4-6 months because of patient's elevated liver enzymes. We sent her to nutrition specialist today.  ---> B/c elevated liver enzymes-->  Reck in 4months and send pt to nutritionist  _ If NI in chol levels, will need to discuss alternate txmnts.  >>ASSESSMENT AND PLAN FOR LOW SERUM HDL WRITTEN ON 10/24/2015 12:32 PM BY Makensey Rego, DO  Counseling done. Increase aerobic activity to 30+ minutes moderate intensity exercise 5 days a week or more.

## 2015-10-24 NOTE — Assessment & Plan Note (Signed)
>>  ASSESSMENT AND PLAN FOR BMI 40.0-44.9, ADULT ([facility]) WRITTEN ON 10/24/2015  8:56 AM BY OPALSKI, DEBORAH, DO  Total patient due to the increased stress in her life right now don't want her worry about "losing weight ". I want her to focus on healthy eating and improving her overall health and wellness.

## 2015-10-24 NOTE — Assessment & Plan Note (Signed)
Review of ultrasound that was done in 2016. Showed fatty liver infiltrate. Review of labs do not show hepatitis panel so we will obtain one today.

## 2015-10-24 NOTE — Assessment & Plan Note (Signed)
Counseling done. Increase aerobic activity to 30+ minutes moderate intensity exercise 5 days a week or more.

## 2015-10-24 NOTE — Progress Notes (Signed)
Impression and Recommendations:    1. Essential hypertension   2. Fatty liver disease, nonalcoholic   3. Obesity, Class III, BMI 40-49.9 (morbid obesity) ([name])   4. Other specified hypothyroidism   5. Hx of papillary thyroid cancer   6. Elevated liver enzymes   7. Vitamin D deficiency   8. Hyperlipidemia   9. Migraine with aura and without status migrainosus, not intractable   10. Prediabetes   11. Low serum HDL     Fatty liver disease, nonalcoholic 68/01/7516: Ultrasound obtained. IMPRESSION:  1. Gallstones without sonographic evidence of acute cholecystitis.  2. Mild dilation of the common bile duct without definite evidence of intraluminal stones. Limited visualization of the pancreatic head revealed no mass.  3. Increased hepatic echotexture consistent with fatty infiltrative Change.    Prediabetes a1c 6.2  - told she was pre-DM in past about 5 yrs ago or so.  Gave her handouts on high glycemic index foods and to stay away from them. She will try to exercise more to a goal of 30 minutes daily especially for stress management.    Hyperlipidemia > 7.5% 10 yr risk--> I rec statins, but we will try intensive dietary lifestyle modification for 4-6 months because of patient's elevated liver enzymes. We sent her to nutrition specialist today. ---> B/c elevated liver enzymes-->  Reck in 36month and send pt to nutritionist.   If NI in chol levels, will need to discuss alternate txmnts.    Obesity, Class III, BMI 40-49.9 (morbid obesity) ([name] Total patient due to the increased stress in her life right now don't want her worry about "losing weight ". I want her to focus on healthy eating and improving her overall health and wellness.    HTN (hypertension) Blood pressure still not optimal. Patient will increase metoprolol XL to 100 mg daily. She will continue to monitor at home and bring in a blood pressure log at next office visit.    Elevated liver  enzymes Review of ultrasound that was done in 2016. Showed fatty liver infiltrate. Review of labs do not show hepatitis panel so we will obtain one today.    Migraine headache with aura Experiences wavey line aura, usually on L. No visual changes, N/V.   Sx of migraines with aura since age 55   Imitirex- doesn't like how it makes her feel.  B-blockers in past worked best.  No migraines this past couple of months.    Low serum HDL Counseling done. Increase aerobic activity to 30+ minutes moderate intensity exercise 5 days a week or more.   Pt was in the office today for 40+ minutes, with over 50% time spent in face to face counseling of various medical concerns and in coordination of care Patient's Medications  New Prescriptions   No medications on file  Previous Medications   CALCIUM-VITAMIN D 250-100 MG-UNIT TABLET    Take 1 tablet by mouth 1 day or 1 dose.   CO-ENZYME Q-10 30 MG CAPSULE    Take 100 mg by mouth daily.   LEVOTHYROXINE (SYNTHROID, LEVOTHROID) 175 MCG TABLET    Take 1 tablet (175 mcg total) by mouth daily before breakfast.   MAGNESIUM OXIDE (MAG-OX) 400 MG TABLET    Take 2 tablets by mouth daily.   MILK THISTLE 175 MG TABLET    Take 175 mg by mouth daily.   OMEGA-3 FATTY ACIDS (FISH OIL) 1200 MG CAPS    Take 2 capsules by mouth daily.  SUMATRIPTAN (IMITREX) 100 MG TABLET    Take 1 tablet (100 mg total) by mouth once. May repeat in 2 hours if headache persists or recurs.   TURMERIC PO    Take 2 tablets by mouth daily.  Modified Medications   Modified Medication Previous Medication   CHOLECALCIFEROL (VITAMIN D) 2000 UNITS CAPS Cholecalciferol (VITAMIN D) 2000 UNITS CAPS      Take 3 capsules (6,000 Units total) by mouth daily.    Take 1 capsule (2,000 Units total) by mouth daily.   METOPROLOL SUCCINATE (TOPROL-XL) 100 MG 24 HR TABLET metoprolol succinate (TOPROL-XL) 50 MG 24 hr tablet      1 q am    Take 1 tablet (50 mg total) by mouth daily. Take with or immediately  following a meal.  Discontinued Medications   No medications on file    Return in about 4 weeks (around 11/21/2015) for f/up BP- bring in log,  (reck chol, LFT, A1c 4 mo) . Follow-up 4 weeks for elevated blood pressure and because we increased her metoprolol.  Bring in home blood pressure log  The patient was counseled, risk factors were discussed, anticipatory guidance given.  Gross side effects, risk and benefits, and alternatives of medications discussed with patient.  Patient is aware that all medications have potential side effects and we are unable to predict every side effect or drug-drug interaction that may occur.  Expresses verbal understanding and consents to current therapy plan and treatment regimen.  Please see AVS handed out to patient at the end of our visit for further patient instructions/ counseling done pertaining to today's office visit.    Note: This document was prepared using Dragon voice recognition software and may include unintentional dictation errors.   --------------------------------------------------------------------------------------------------------------------------------------------------------------------------------------------------------------------------------------------    Subjective:    CC:  Chief Complaint  Patient presents with  . Results    HPI: Miranda Delacruz is a 55 y.o. female who presents to [place] at [facility] today for issues as discussed below.   Patient here for follow-up of recent labs and elevated blood pressure which we started metoprolol for in the past at last office visit   Hypertension: Taking metoprolol daily. Blood pressure at home is been running 150s over 90s on a regular basis. Completely asymptomatic from a cardiovascular and neurological standpoint. See ROS below  Mood:  More stress lately.   Just started new job.   2 wks ago- lost her baby brother due to "widow-maker", took poor care of  himself.  SMoked, drank a lot etc.   And now daughter who is only 30yo- dating 43yo.  Patient recently found this out but they've been dating 8-10 months and now plan on getting married.   Daughter with major depression/ anxiety and ? Bi-polar.   History of elevated liver enzymes in the past.  Worse then they are now- 10 months ago they were 133 and 179 and now AST is 106 and ALT 144 respectively.  In the past per patient, PCP ordered abdominal ultrasound which showed fatty liver infiltrate and gallstones without cholecystitis.   Patient denies that he obtained a hepatitis panel or any other labs.  Obesity: Patient excited she lost 3 pounds but is also disappointed.  She has been on a vegetarian diet and eating more carbohydrates which she think has led to her prediabetic state.     NEW Diagnosis prediabetes today.     Wt Readings from Last 3 Encounters:  10/24/15 263 lb 1.6 oz (119.3  kg)  09/25/15 266 lb (120.7 kg)  09/13/15 265 lb (120.2 kg)   BP Readings from Last 3 Encounters:  10/24/15 (!) 157/103  09/25/15 (!) 170/111  09/13/15 (!) 186/90   Pulse Readings from Last 3 Encounters:  10/24/15 89  09/25/15 88  09/13/15 71     Patient Active Problem List   Diagnosis Date Noted  . Prediabetes 10/24/2015  . Low serum HDL 10/24/2015  . Menopause present 09/25/2015  . Migraine headache with aura 02/11/2015  . Special screening for malignant neoplasms, colon   . Benign neoplasm of ascending colon   . Benign neoplasm of transverse colon   . Benign neoplasm of sigmoid colon   . Fatty liver disease, nonalcoholic 32/20/2542  . Elevated liver enzymes 12/10/2014  . Hyperlipidemia 12/10/2014  . Vitamin D deficiency 12/10/2014  . Obesity, Class III, BMI 40-49.9 (morbid obesity) ([place]) 11/21/2014  . Hypothyroidism 11/21/2014  . HTN (hypertension) 11/21/2014  . Hx of papillary thyroid cancer 11/21/2014    Past Medical history, Surgical history, Family history, Social history, Allergies  and Medications have been entered into the medical record, reviewed and changed as needed.   Allergies:  No Known Allergies  Review of Systems: No fever/ chills, night sweats, no unintended weight loss, No chest pain, or increased shortness of breath. No N/V/D.  Pertinent positives and negatives noted in HPI above    Objective:   Blood pressure (!) 157/103, pulse 89, height _0  (1.727 m), weight 263 lb 1.6 oz (119.3 kg). Body mass index is 40 kg/m. General: Well Developed, well nourished, appropriate for stated age.  Neuro: Alert and oriented x3, extra-ocular muscles intact, sensation grossly intact.  HEENT: Normocephalic, atraumatic, neck supple   Skin: Warm and dry, no gross rash. Cardiac: RRR, S1 S2,  no murmurs rubs or gallops.  Respiratory: ECTA B/L, Not using accessory muscles, speaking in full sentences-unlabored. Vascular:  No gross lower ext edema, cap RF less 2 sec. Psych: No HI/SI, judgement and insight good, Euthymic mood. Full Affect.   Recent Results (from the past 2160 hour(s))  CBC w/Diff     Status: None   Collection Time: 10/15/15  8:15 AM  Result Value Ref Range   WBC 4.0 3.8 - 10.8 K/uL   RBC 4.80 3.80 - 5.10 MIL/uL   Hemoglobin 15.1 11.7 - 15.5 g/dL   HCT 44.0 35.0 - 45.0 %   MCV 91.7 80.0 - 100.0 fL   MCH 31.5 27.0 - 33.0 pg   MCHC 34.3 32.0 - 36.0 g/dL   RDW 13.8 11.0 - 15.0 %   Platelets 218 140 - 400 K/uL   MPV 10.4 7.5 - 12.5 fL   Neutro Abs 2,200 1,500 - 7,800 cells/uL   Lymphs Abs 1,480 850 - 3,900 cells/uL   Monocytes Absolute 240 200 - 950 cells/uL   Eosinophils Absolute 80 15 - 500 cells/uL   Basophils Absolute 0 0 - 200 cells/uL   Neutrophils Relative % 55 %   Lymphocytes Relative 37 %   Monocytes Relative 6 %   Eosinophils Relative 2 %   Basophils Relative 0 %   Smear Review Criteria for review not met   Comp Met (CMET)     Status: Abnormal   Collection Time: 10/15/15  8:15 AM  Result Value Ref Range   Sodium 139 135 - 146 mmol/L    Potassium 4.4 3.5 - 5.3 mmol/L   Chloride 102 98 - 110 mmol/L   CO2 27 20 - 31 mmol/L  Glucose, Bld 207 (H) 65 - 99 mg/dL   BUN 9 7 - 25 mg/dL   Creat 0.84 0.50 - 1.05 mg/dL    Comment:   For patients > or = 53 years of age: The upper reference limit for Creatinine is approximately 13% higher for people identified as African-American.      Total Bilirubin 1.2 0.2 - 1.2 mg/dL   Alkaline Phosphatase 72 33 - 130 U/L   AST 106 (H) 10 - 35 U/L   ALT 144 (H) 6 - 29 U/L   Total Protein 6.7 6.1 - 8.1 g/dL   Albumin 4.8 3.6 - 5.1 g/dL   Calcium 9.6 8.6 - 10.4 mg/dL  Hemoglobin A1c     Status: Abnormal   Collection Time: 10/15/15  8:15 AM  Result Value Ref Range   Hgb A1c MFr Bld 6.2 (H) <5.7 %    Comment:   For someone without known diabetes, a hemoglobin A1c value between 5.7% and 6.4% is consistent with prediabetes and should be confirmed with a follow-up test.   For someone with known diabetes, a value <7% indicates that their diabetes is well controlled. A1c targets should be individualized based on duration of diabetes, age, co-morbid conditions and other considerations.   This assay result is consistent with an increased risk of diabetes.   Currently, no consensus exists regarding use of hemoglobin A1c for diagnosis of diabetes in children.      Mean Plasma Glucose 131 mg/dL  TSH     Status: None   Collection Time: 10/15/15  8:15 AM  Result Value Ref Range   TSH 2.33 mIU/L    Comment:   Reference Range   > or = 20 Years  0.40-4.50   Pregnancy Range First trimester  0.26-2.66 Second trimester 0.55-2.73 Third trimester  0.43-2.91     Lipid panel     Status: Abnormal   Collection Time: 10/15/15  8:15 AM  Result Value Ref Range   Cholesterol 234 (H) 125 - 200 mg/dL   Triglycerides 357 (H) <150 mg/dL   HDL 30 (L) >=46 mg/dL   Total CHOL/HDL Ratio 7.8 (H) <=5.0 Ratio   VLDL 71 (H) <30 mg/dL   LDL Cholesterol 133 (H) <130 mg/dL    Comment:   Total  Cholesterol/HDL Ratio:CHD Risk                        Coronary Heart Disease Risk Table                                        Men       Women          1/2 Average Risk              3.4        3.3              Average Risk              5.0        4.4           2X Average Risk              9.6        7.1           3X Average Risk  23.4       11.0 Use the calculated Patient Ratio above and the CHD Risk table  to determine the patient's CHD Risk.   VITAMIN D 25 Hydroxy (Vit-D Deficiency, Fractures)     Status: None   Collection Time: 10/15/15  8:15 AM  Result Value Ref Range   Vit D, 25-Hydroxy 38 30 - 100 ng/mL    Comment: Vitamin D Status           25-OH Vitamin D        Deficiency                <20 ng/mL        Insufficiency         20 - 29 ng/mL        Optimal             > or = 30 ng/mL   For 25-OH Vitamin D testing on patients on D2-supplementation and patients for whom quantitation of D2 and D3 fractions is required, the QuestAssureD 25-OH VIT D, (D2,D3), LC/MS/MS is recommended: order code [id number] (patients > 2 yrs).

## 2015-10-24 NOTE — Assessment & Plan Note (Signed)
Experiences wavey line aura, usually on L. No visual changes, N/V.   Sx of migraines with aura since age 55.   Imitirex- doesn't like how it makes her feel.  B-blockers in past worked best.   No migraines this past couple of months.

## 2015-10-24 NOTE — Patient Instructions (Addendum)
Guidelines for a Low Cholesterol, Low Saturated Fat Diet   Fats - Limit total intake of fats and oils. - Avoid butter, stick margarine, shortening, lard, palm and coconut oils. - Limit mayonnaise, salad dressings, gravies and sauces, unless they are homemade with low-fat ingredients. - Limit chocolate. - Choose low-fat and nonfat products, such as low-fat mayonnaise, low-fat or non-hydrogenated peanut butter, low-fat or fat-free salad dressings and nonfat gravy. - Use vegetable oil, such as canola or olive oil. - Look for margarine that does not contain trans fatty acids. - Use nuts in moderate amounts. - Read ingredient labels carefully to determine both amount and type of fat present in foods. Limit saturated and trans fats! - Avoid high-fat processed and convenience foods.  Meats and Meat Alternatives - Choose fish, chicken, [place] and lean meats. - Use dried beans, peas, lentils and tofu. - Limit egg yolks to three to four per week. - If you eat red meat, limit to no more than three servings per week and choose loin or round cuts. - Avoid fatty meats, such as bacon, sausage, franks, luncheon meats and ribs. - Avoid all organ meats, including liver.  Dairy - Choose nonfat or low-fat milk, yogurt and cottage cheese. - Most cheeses are high in fat. Choose cheeses made from non-fat milk, such as mozzarella and ricotta cheese. - Choose light or fat-free cream cheese and sour cream. - Avoid cream and sauces made with cream.  Fruits and Vegetables - Eat a wide variety of fruits and vegetables. - Use lemon juice, vinegar or "mist" olive oil on vegetables. - Avoid adding sauces, fat or oil to vegetables.  Breads, Cereals and Grains - Choose whole-grain breads, cereals, pastas and rice. - Avoid high-fat snack foods, such as granola, cookies, pies, pastries, doughnuts and croissants.  Cooking Tips - Avoid deep fried foods. - Trim visible fat off meats and remove skin from  poultry before cooking. - Bake, broil, boil, poach or roast poultry, fish and lean meats. - Drain and discard fat that drains out of meat as you cook it. - Add little or no fat to foods. - Use vegetable oil sprays to grease pans for cooking or baking. - Steam vegetables. - Use herbs or no-oil marinades to flavor foods.        Prediabetes Eating Plan Prediabetes--also called impaired glucose tolerance or impaired fasting glucose--is a condition that causes blood sugar (blood glucose) levels to be higher than normal. Following a healthy diet can help to keep prediabetes under control. It can also help to lower the risk of type 2 diabetes and heart disease, which are increased in people who have prediabetes. Along with regular exercise, a healthy diet:  Promotes weight loss.  Helps to control blood sugar levels.  Helps to improve the way that the body uses insulin. WHAT DO I NEED TO KNOW ABOUT THIS EATING PLAN?  Use the glycemic index (GI) to plan your meals. The index tells you how quickly a food will raise your blood sugar. Choose low-GI foods. These foods take a longer time to raise blood sugar.  Pay close attention to the amount of carbohydrates in the food that you eat. Carbohydrates increase blood sugar levels.  Keep track of how many calories you take in. Eating the right amount of calories will help you to achieve a healthy weight. Losing about 7 percent of your starting weight can help to prevent type 2 diabetes.  You may want to follow a Mediterranean  diet. This diet includes a lot of vegetables, lean meats or fish, whole grains, fruits, and healthy oils and fats. WHAT FOODS CAN I EAT? Grains Whole grains, such as whole-wheat or whole-grain breads, crackers, cereals, and pasta. Unsweetened oatmeal. Bulgur. Barley. Quinoa. Brown rice. Corn or whole-wheat flour tortillas or taco shells. Vegetables Lettuce. Spinach. Peas. Beets. Cauliflower. Cabbage. Broccoli. Carrots.  Tomatoes. Squash. Eggplant. Herbs. Peppers. Onions. Cucumbers. Brussels sprouts. Fruits Berries. Bananas. Apples. Oranges. Grapes. Papaya. Mango. Pomegranate. Kiwi. Grapefruit. Cherries. Meats and Other Protein Sources Seafood. Lean meats, such as chicken and [place] or lean cuts of pork and beef. Tofu. Eggs. Nuts. Beans. Dairy Low-fat or fat-free dairy products, such as yogurt, cottage cheese, and cheese. Beverages Water. Tea. Coffee. Sugar-free or diet soda. Seltzer water. Milk. Milk alternatives, such as soy or almond milk. Condiments Mustard. Relish. Low-fat, low-sugar ketchup. Low-fat, low-sugar barbecue sauce. Low-fat or fat-free mayonnaise. Sweets and Desserts Sugar-free or low-fat pudding. Sugar-free or low-fat ice cream and other frozen treats. Fats and Oils Avocado. Walnuts. Olive oil. The items listed above may not be a complete list of recommended foods or beverages. Contact your dietitian for more options.  WHAT FOODS ARE NOT RECOMMENDED? Grains Refined white flour and flour products, such as bread, pasta, snack foods, and cereals. Beverages Sweetened drinks, such as sweet iced tea and soda. Sweets and Desserts Baked goods, such as cake, cupcakes, pastries, cookies, and cheesecake. The items listed above may not be a complete list of foods and beverages to avoid. Contact your dietitian for more information.   This information is not intended to replace advice given to you by your health care provider. Make sure you discuss any questions you have with your health care provider.   Document Released: 06/26/2014 Document Reviewed: 06/26/2014 Elsevier Interactive Patient Education 2016 [place] factors for prediabetes and type 2 diabetes  Researchers don't fully understand why some people develop prediabetes and type 2 diabetes and others don't.  It's clear that certain factors increase the risk, however, including:  Weight. The more fatty tissue you  have, the more resistant your cells become to insulin.  Inactivity. The less active you are, the greater your risk. Physical activity helps you control your weight, uses up glucose as energy and makes your cells more sensitive to insulin.  Family history. Your risk increases if a parent or sibling has type 2 diabetes.  Race. Although it's unclear why, people of certain races - including blacks, Hispanics, American Indians and Asian-Americans - are at higher risk.  Age. Your risk increases as you get older. This may be because you tend to exercise less, lose muscle mass and gain weight as you age. But type 2 diabetes is also increasing dramatically among children, adolescents and younger adults.  Gestational diabetes. If you developed gestational diabetes when you were pregnant, your risk of developing prediabetes and type 2 diabetes later increases. If you gave birth to a baby weighing more than 9 pounds (4 kilograms), you're also at risk of type 2 diabetes.  Polycystic ovary syndrome. For women, having polycystic ovary syndrome - a common condition characterized by irregular menstrual periods, excess hair growth and obesity - increases the risk of diabetes.  High blood pressure. Having blood pressure over 140/90 millimeters of mercury (mm Hg) is linked to an increased risk of type 2 diabetes.  Abnormal cholesterol and triglyceride levels. If you have low levels of high-density lipoprotein (HDL), or "good," cholesterol, your risk of type  2 diabetes is higher. Triglycerides are another type of fat carried in the blood. People with high levels of triglycerides have an increased risk of type 2 diabetes. Your doctor can let you know what your cholesterol and triglyceride levels are.    A good guide to good carbs: The glycemic index ---If you have diabetes, or at risk for diabetes, you know all too well that when you eat carbohydrates, your blood sugar goes up. The total amount of carbs you consume at a meal  or in a snack mostly determines what your blood sugar will do. But the food itself also plays a role. A serving of white rice has almost the same effect as eating pure table sugar - a quick, high spike in blood sugar. A serving of lentils has a slower, smaller effect.  ---Picking good sources of carbs can help you control your blood sugar and your weight. Even if you don't have diabetes, eating healthier carbohydrate-rich foods can help ward off a host of chronic conditions, from heart disease to various cancers to, well, diabetes.  ---One way to choose foods is with the glycemic index (GI). This tool measures how much a food boosts blood sugar.  The glycemic index rates the effect of a specific amount of a food on blood sugar compared with the same amount of pure glucose. A food with a glycemic index of 28 boosts blood sugar only 28% as much as pure glucose. One with a GI of 95 acts like pure glucose.  High glycemic foods result in a quick spike in insulin and blood sugar (also known as blood glucose). Low glycemic foods have a slower, smaller effect.   Using the glycemic index Using the glycemic index is easy: choose foods in the low GI category instead of those in the high GI category (see below), and go easy on those in between. Low glycemic index (GI of 55 or less): Most fruits and vegetables, beans, minimally processed grains, pasta, low-fat dairy foods, and nuts.  Moderate glycemic index (GI 56 to 69): White and sweet potatoes, corn, white rice, couscous, breakfast cereals such as Cream of Wheat and Mini Wheats.  High glycemic index (GI of 70 or higher): White bread, rice cakes, most crackers, bagels, cakes, doughnuts, croissants, most packaged breakfast cereals. You can see the values for 100 commons foods and get links to more at www.health.CheapToothpicks.si.  Swaps for lowering glycemic index  Instead of this high-glycemic index food Eat this lower-glycemic index food  White rice Brown  rice or converted rice  Instant oatmeal Steel-cut oats  Cornflakes Bran flakes  Baked potato Pasta, bulgur  White bread Whole-grain bread  Corn Peas or leafy greens

## 2015-10-24 NOTE — Assessment & Plan Note (Signed)
Total patient due to the increased stress in her life right now don't want her worry about "losing weight ". I want her to focus on healthy eating and improving her overall health and wellness.

## 2015-10-24 NOTE — Assessment & Plan Note (Signed)
>>  ASSESSMENT AND PLAN FOR HYPERLIPIDEMIA WRITTEN ON 11/19/2022  9:26 AM BY OPALSKI, DEBORAH, DO  >>ASSESSMENT AND PLAN FOR HYPERLIPIDEMIA WRITTEN ON 10/24/2015 12:23 PM BY OPALSKI, DEBORAH, DO  > 7.5% 10 yr risk--> I rec statins, but we will try intensive dietary lifestyle modification for 4-6 months because of patient's elevated liver enzymes. We sent her to nutrition specialist today.  ---> B/c elevated liver enzymes-->  Reck in 4months and send pt to nutritionist  _ If NI in chol levels, will need to discuss alternate txmnts.  >>ASSESSMENT AND PLAN FOR LOW SERUM HDL WRITTEN ON 10/24/2015 12:32 PM BY OPALSKI, DEBORAH, DO  Counseling done. Increase aerobic activity to 30+ minutes moderate intensity exercise 5 days a week or more.

## 2015-10-24 NOTE — Assessment & Plan Note (Addendum)
12/18/2014: Ultrasound obtained.IMPRESSION: 1. Gallstones without sonographic evidence of acute cholecystitis. 2. Mild dilation of the common bile duct without definite evidence of intraluminal stones. Limited visualization of the pancreatic head revealed no mass. 3. Increased hepatic echotexture consistent with fatty infiltrative Change.

## 2015-10-24 NOTE — Assessment & Plan Note (Signed)
>>  ASSESSMENT AND PLAN FOR HTN (HYPERTENSION) WRITTEN ON 10/24/2015  8:56 AM BY OPALSKI, DEBORAH, DO  Blood pressure still not optimal. Patient will increase metoprolol XL to 100 mg daily. She will continue to monitor at home and bring in a blood pressure log at next office visit.

## 2015-10-24 NOTE — Assessment & Plan Note (Addendum)
a1c 6.2  - told she was pre-DM in past about 5 yrs ago or so.   Gave her handouts on high glycemic index foods and to stay away from them. She will try to exercise more to a goal of 30 minutes daily especially for stress management.

## 2015-10-25 ENCOUNTER — Encounter: Payer: Self-pay | Admitting: Family Medicine

## 2015-10-25 LAB — HEPATITIS C ANTIBODY: HCV AB: NEGATIVE

## 2015-10-25 LAB — HEPATITIS PANEL, ACUTE
HCV Ab: NEGATIVE
HEP B S AG: NEGATIVE
Hep A IgM: NONREACTIVE
Hep B C IgM: NONREACTIVE

## 2015-11-21 ENCOUNTER — Encounter: Payer: Self-pay | Admitting: Family Medicine

## 2015-11-21 ENCOUNTER — Ambulatory Visit (INDEPENDENT_AMBULATORY_CARE_PROVIDER_SITE_OTHER): Payer: BLUE CROSS/BLUE SHIELD | Admitting: Family Medicine

## 2015-11-21 VITALS — BP 147/83 | HR 83 | Temp 98.4°F | Ht 68.0 in | Wt 263.4 lb

## 2015-11-21 DIAGNOSIS — R7303 Prediabetes: Secondary | ICD-10-CM | POA: Diagnosis not present

## 2015-11-21 DIAGNOSIS — I10 Essential (primary) hypertension: Secondary | ICD-10-CM | POA: Diagnosis not present

## 2015-11-21 MED ORDER — AMLODIPINE BESYLATE 5 MG PO TABS: 2.5000 mg | ORAL_TABLET | Freq: Every day | ORAL | 0 refills | Status: DC

## 2015-11-21 NOTE — Progress Notes (Signed)
Impression and Recommendations:    1. Obesity, Class III, BMI 40-49.9 (morbid obesity) ([place])   2. Essential hypertension   3. Prediabetes    -  PLEASE USE My fitness pal or Lose it app.  Please track everything you put in your mouth on there. This will help [place] determine whether eating too little too much or what not.  -  Great job with walking-  try to increase that to 30 minutes 5 days a week or more.  -  Start Norvasc today.  -Continue to monitor your blood pressure at home we would like for your young age of goal of 130/80 or less on a regular basis.   -If you have any symptoms or concerns before the next office visit, please be sure to give [place] a call.   Education and routine counseling performed. Handouts provided.  The patient was counseled, risk factors were discussed, anticipatory guidance given.  Gross side effects, risk and benefits, and alternatives of medications discussed with patient.  Patient is aware that all medications have potential side effects and we are unable to predict every side effect or drug-drug interaction that may occur.  Expresses verbal understanding and consents to current therapy plan and treatment regimen.   New Prescriptions   AMLODIPINE (NORVASC) 5 MG TABLET    Take 0.5 tablets (2.5 mg total) by mouth daily. For one week then increase to 1 tab daily   Modified Medications   No medications on file   Discontinued Medications   No medications on file   Return in about 4 weeks (around 12/19/2015). Recheck of blood pressure after starting new medications-amlodipine  Please see AVS handed out to patient at the end of our visit for further patient instructions/ counseling done pertaining to today's office visit.    Note: This document was prepared using Dragon voice recognition software and may include unintentional dictation errors.     Subjective:    Chief Complaint  Patient presents with  . Hypertension    HPI: Miranda Delacruz is  a 55 y.o. female who presents to [place] at [facility] today for follow up for HTN.     HTN:  Last office visit on 8\31 we increased her metoprolol XL 200 mg daily. She brought in a blood pressure log for my review. Blood pressures have been running above goal in the 140s-150s range over 80s to 90s -On average.   Pt has been tolerating meds well.  Taking as prescribed.  Denies HA, dizziness, CP, SOB, Visual changes, increasing pedal edema.    Preventitive Healthcare:  Exercise: yes, 30 minutes of walking 2 days a week   Diet Pattern: She has cut out all carbohydrates, breads, sweets, white flour products since last office visit.   She has not lost any weight and is not tracking her calories at all.  Salt Restriction: Salt intake has decreased likely secondary to improved nutrition   Obesity:   Patient has not lost any weight since last seen  Wt Readings from Last 3 Encounters:  11/21/15 263 lb 6.4 oz (119.5 kg)  10/24/15 263 lb 1.6 oz (119.3 kg)  09/25/15 266 lb (120.7 kg)    BP Readings from Last 3 Encounters:  11/21/15 (!) 147/83  10/24/15 (!) 157/103  09/25/15 (!) 170/111    Pulse Readings from Last 3 Encounters:  11/21/15 83  10/24/15 89  09/25/15 88    BMI Readings from Last 3 Encounters:  11/21/15 40.05 kg/m  10/24/15 40.00 kg/m  09/25/15 40.45 kg/m     Lab Results  Component Value Date   CREATININE 0.84 10/15/2015   BUN 9 10/15/2015   NA 139 10/15/2015   K 4.4 10/15/2015   CL 102 10/15/2015   CO2 27 10/15/2015    Lab Results  Component Value Date   CHOL 234 (H) 10/15/2015   CHOL 254 (H) 12/07/2014    Lab Results  Component Value Date   HDL 30 (L) 10/15/2015   HDL 36 (L) 12/07/2014    Lab Results  Component Value Date   LDLCALC 133 (H) 10/15/2015   LDLCALC 149 (H) 12/07/2014    Lab Results  Component Value Date   TRIG 357 (H) 10/15/2015   TRIG 347 (H) 12/07/2014    Lab Results  Component Value Date   CHOLHDL 7.8 (H)  10/15/2015   CHOLHDL 7.1 (H) 12/07/2014    No results found for: LDLDIRECT ===================================================================  Patient Active Problem List   Diagnosis Date Noted  . Obesity, Class III, BMI 40-49.9 (morbid obesity) ([place]) 11/21/2014    Priority: High  . HTN (hypertension) 11/21/2014    Priority: High  . Prediabetes 10/24/2015  . Low serum HDL 10/24/2015  . Menopause present 09/25/2015  . Migraine headache with aura 02/11/2015  . Special screening for malignant neoplasms, colon   . Benign neoplasm of ascending colon   . Benign neoplasm of transverse colon   . Benign neoplasm of sigmoid colon   . Fatty liver disease, nonalcoholic 70/02/7492  . Elevated liver enzymes 12/10/2014  . Hyperlipidemia 12/10/2014  . Vitamin D deficiency 12/10/2014  . Hypothyroidism 11/21/2014  . Hx of papillary thyroid cancer 11/21/2014    Past Medical History:  Diagnosis Date  . Cancer ([place])   . Hyperlipidemia   . Hypertension   . Thyroid disease     Past Surgical History:  Procedure Laterality Date  . COLONOSCOPY  2000   normal  . COLONOSCOPY WITH PROPOFOL N/A 01/22/2015   Procedure: COLONOSCOPY WITH PROPOFOL;  Surgeon: Lucilla Lame, MD;  Location: [facility] ENDOSCOPY;  Service: Endoscopy;  Laterality: N/A;  . THYROIDECTOMY    . TONSILLECTOMY    . TUBAL LIGATION      Family History  Problem Relation Age of Onset  . Hypertension Mother   . Hypertension Maternal Grandmother   . Stroke Maternal Grandmother     History  Drug Use No  ,  History  Alcohol Use  . 0.0 oz/week  ,  History  Smoking Status  . Former Smoker  . Quit date: 02/24/1987  Smokeless Tobacco  . Never Used  ,    Current Outpatient Prescriptions on File Prior to Visit  Medication Sig Dispense Refill  . calcium-vitamin D 250-100 MG-UNIT tablet Take 1 tablet by mouth 1 day or 1 dose.    . Cholecalciferol (VITAMIN D) 2000 units CAPS Take 3 capsules (6,000 Units total) by mouth daily. 30  capsule   . co-enzyme Q-10 30 MG capsule Take 100 mg by mouth daily.    Marland Kitchen levothyroxine (SYNTHROID, LEVOTHROID) 175 MCG tablet Take 1 tablet (175 mcg total) by mouth daily before breakfast. 90 tablet 3  . magnesium oxide (MAG-OX) 400 MG tablet Take 2 tablets by mouth daily.    . metoprolol succinate (TOPROL-XL) 100 MG 24 hr tablet 1 q am 90 tablet 0  . milk thistle 175 MG tablet Take 175 mg by mouth daily.    . Omega-3 Fatty Acids (FISH OIL) 1200 MG CAPS Take  2 capsules by mouth daily.    . SUMAtriptan (IMITREX) 100 MG tablet Take 1 tablet (100 mg total) by mouth once. May repeat in 2 hours if headache persists or recurs. 10 tablet 2  . TURMERIC PO Take 2 tablets by mouth daily.     No current facility-administered medications on file prior to visit.     No Known Allergies   Review of Systems  Constitutional: Positive for malaise/fatigue. Negative for chills, diaphoresis, fever and weight loss.  HENT: Positive for congestion and ear discharge. Negative for sore throat and tinnitus.   Eyes: Negative.  Negative for blurred vision, double vision and photophobia.  Respiratory: Positive for cough. Negative for wheezing.   Cardiovascular: Negative.  Negative for chest pain and palpitations.  Gastrointestinal: Negative.  Negative for blood in stool, diarrhea, nausea and vomiting.  Genitourinary: Negative.  Negative for dysuria, frequency and urgency.  Musculoskeletal: Negative.  Negative for joint pain and myalgias.  Skin: Negative.  Negative for itching and rash.  Neurological: Negative.  Negative for dizziness, focal weakness, weakness and headaches.  Endo/Heme/Allergies: Negative.  Negative for environmental allergies and polydipsia. Does not bruise/bleed easily.  Psychiatric/Behavioral: Negative.  Negative for depression and memory loss. The patient is not nervous/anxious and does not have insomnia.     Objective:    Blood pressure (!) 147/83, pulse 83, temperature 98.4 F (36.9 C),  temperature source Oral, height 5' 8"  (1.727 m), weight 263 lb 6.4 oz (119.5 kg).  Body mass index is 40.05 kg/m. General: Well Developed, well nourished, and in no acute distress.  HEENT: Normocephalic, atraumatic, pupils equal round reactive to light, neck supple, No carotid bruits, no JVD Skin: Warm and dry, cap RF less 2 sec Cardiac: Regular rate and rhythm, S1, S2 WNL's, no murmurs rubs or gallops Respiratory: ECTA B/L, Not using accessory muscles, speaking in full sentences. NeuroM-Sk: Ambulates w/o assistance, moves ext * 4 w/o difficulty, sensation grossly intact.  Ext:  edema b/l lower ext Psych: No HI/SI, judgement and insight good, Euthymic mood. Full Affect.

## 2015-11-21 NOTE — Patient Instructions (Addendum)
My fitness pal or Lose it app.  Please track everything you put in your mouth on there. This will help [place] determine whether eating too little too much or what not.  Great job with walking-  try to increase that to 30 minutes 5 days a week or more.  Continue to monitor your blood pressure at home we would like for your young age of goal of 130/80 or less on a regular basis. If you have any symptoms or concerns before the next office visit, please be sure to give [place] a call.

## 2015-12-27 ENCOUNTER — Ambulatory Visit: Payer: BLUE CROSS/BLUE SHIELD | Admitting: Family Medicine

## 2016-01-03 ENCOUNTER — Ambulatory Visit (INDEPENDENT_AMBULATORY_CARE_PROVIDER_SITE_OTHER): Payer: BLUE CROSS/BLUE SHIELD | Admitting: Family Medicine

## 2016-01-03 ENCOUNTER — Encounter: Payer: Self-pay | Admitting: Family Medicine

## 2016-01-03 VITALS — BP 164/88 | HR 77 | Ht 68.0 in | Wt 265.9 lb

## 2016-01-03 DIAGNOSIS — I10 Essential (primary) hypertension: Secondary | ICD-10-CM

## 2016-01-03 DIAGNOSIS — Z23 Encounter for immunization: Secondary | ICD-10-CM | POA: Diagnosis not present

## 2016-01-03 DIAGNOSIS — R7303 Prediabetes: Secondary | ICD-10-CM

## 2016-01-03 DIAGNOSIS — E782 Mixed hyperlipidemia: Secondary | ICD-10-CM

## 2016-01-03 DIAGNOSIS — R748 Abnormal levels of other serum enzymes: Secondary | ICD-10-CM

## 2016-01-03 MED ORDER — CHLORTHALIDONE 25 MG PO TABS: 25.0000 mg | ORAL_TABLET | Freq: Every day | ORAL | 1 refills | Status: DC

## 2016-01-03 NOTE — Progress Notes (Signed)
Impression and Recommendations:    1. Essential hypertension   2. Obesity, Class III, BMI 40-49.9 (morbid obesity) ([place])   3. Prediabetes   4. Low serum HDL   5. Need for prophylactic vaccination and inoculation against influenza   6. Mixed hyperlipidemia    HTN (hypertension) BP still NOT at goal.  8/31--> we doubled her toprol XL 9/28--> added norvasc TODAY:  Still not at goal- > add chlorthalidone - next OV reck bmp  Lifestyle changes such as dash diet, wt loss encouraged and engaging in a regular exercise program discussed with patient.   Educational handouts provided  Ambulatory BP monitoring encouraged. Keep log and bring in next OV  Also, risks and benefits of medications discussed with patient, including alternative treatments.   Encouraged patient to read drug information handouts to further educate self about the medicine prior to starting it.   Contact us prior with any Q's/ concerns.   Obesity, Class III, BMI 40-49.9 (morbid obesity) ([place]) Pt very interested in wt loss.   Encouraged to track foods.  Handouts given  New Prescriptions   CHLORTHALIDONE (HYGROTON) 25 MG TABLET    Take 1 tablet (25 mg total) by mouth daily.    Return in about 4 weeks (around 01/31/2016) for f/up starting chlorothalidone BP meds.  The patient was counseled, risk factors were discussed, anticipatory guidance given.  Gross side effects, risk and benefits, and alternatives of medications discussed with patient.  Patient is aware that all medications have potential side effects and we are unable to predict every side effect or drug-drug interaction that may occur.  Expresses verbal understanding and consents to current therapy plan and treatment regimen.  Please see AVS handed out to patient at the end of our visit for further patient instructions/ counseling done pertaining to today's office visit.    Note: This document was prepared using Dragon voice recognition software and may  include unintentional dictation errors.     Subjective:    Chief Complaint  Patient presents with  . Hypertension    HPI: Miranda Delacruz is a 55 y.o. female who presents to [place] at [facility] today for follow up for HTN.   Started Norvasc last OV- here to f/up.   HTN: Home BP readings have been running in the 130's- 140's/ 80's-90's. .   Pt has been tolerating meds well.  Taking as prescribed.  Denies HA, dizziness, CP, SOB, Visual changes, increasing pedal edema.    Preventitive Healthcare:  Exercise: yes - treadmill everyday 105mn and lifting light wts- 10-2105m 2-3days/week  Diet Pattern: been good excpt for this week   Salt Restriction:  None this week- prior yes.   From 10/24/15:   Hyperlipidemia  > 7.5% 10 yr risk--> I rec statins, but we will try intensive dietary lifestyle modification for 4-6 months because of patient's elevated liver enzymes. We sent her to nutrition specialist   today.  ---> B/c elevated liver enzymes-->  Reck in [id number] send pt to nutritionist.   If NI in chol levels, will need to discuss alternate txmnts.   Patient Care Team    Relationship Specialty Notifications Start End  DebMellody DanceO PCP - General Family Medicine  09/24/15      Wt Readings from Last 3 Encounters:  01/03/16 265 lb 14.4 oz (120.6 kg)  11/21/15 263 lb 6.4 oz (119.5 kg)  10/24/15 263 lb 1.6 oz (119.3 kg)    BP Readings from Last 3  Encounters:  01/03/16 (!) 164/88  11/21/15 (!) 147/83  10/24/15 (!) 157/103    Pulse Readings from Last 3 Encounters:  01/03/16 77  11/21/15 83  10/24/15 89    BMI Readings from Last 3 Encounters:  01/03/16 40.43 kg/m  11/21/15 40.05 kg/m  10/24/15 40.00 kg/m     Lab Results  Component Value Date   CREATININE 0.84 10/15/2015   BUN 9 10/15/2015   NA 139 10/15/2015   K 4.4 10/15/2015   CL 102 10/15/2015   CO2 27 10/15/2015    Lab Results  Component Value Date   CHOL 234 (H) 10/15/2015    CHOL 254 (H) 12/07/2014    Lab Results  Component Value Date   HDL 30 (L) 10/15/2015   HDL 36 (L) 12/07/2014    Lab Results  Component Value Date   LDLCALC 133 (H) 10/15/2015   LDLCALC 149 (H) 12/07/2014    Lab Results  Component Value Date   TRIG 357 (H) 10/15/2015   TRIG 347 (H) 12/07/2014    Lab Results  Component Value Date   CHOLHDL 7.8 (H) 10/15/2015   CHOLHDL 7.1 (H) 12/07/2014    No results found for: LDLDIRECT ===================================================================  Patient Active Problem List   Diagnosis Date Noted  . Obesity, Class III, BMI 40-49.9 (morbid obesity) ([place]) 11/21/2014    Priority: High  . HTN (hypertension) 11/21/2014    Priority: High  . Prediabetes 10/24/2015  . Low serum HDL 10/24/2015  . Menopause present 09/25/2015  . Migraine headache with aura 02/11/2015  . Special screening for malignant neoplasms, colon   . Benign neoplasm of ascending colon   . Benign neoplasm of transverse colon   . Benign neoplasm of sigmoid colon   . Fatty liver disease, nonalcoholic 03/50/0938  . Elevated liver enzymes 12/10/2014  . Hyperlipidemia 12/10/2014  . Vitamin D deficiency 12/10/2014  . Hypothyroidism 11/21/2014  . Hx of papillary thyroid cancer 11/21/2014    Past Medical History:  Diagnosis Date  . Cancer ([place])   . Hyperlipidemia   . Hypertension   . Thyroid disease     Past Surgical History:  Procedure Laterality Date  . COLONOSCOPY  2000   normal  . COLONOSCOPY WITH PROPOFOL N/A 01/22/2015   Procedure: COLONOSCOPY WITH PROPOFOL;  Surgeon: Lucilla Lame, MD;  Location: [facility] ENDOSCOPY;  Service: Endoscopy;  Laterality: N/A;  . THYROIDECTOMY    . TONSILLECTOMY    . TUBAL LIGATION      Family History  Problem Relation Age of Onset  . Hypertension Mother   . Hypertension Maternal Grandmother   . Stroke Maternal Grandmother     History  Drug Use No  ,  History  Alcohol Use  . 0.0 oz/week  ,  History  Smoking  Status  . Former Smoker  . Quit date: 02/24/1987  Smokeless Tobacco  . Never Used  ,    Current Outpatient Prescriptions on File Prior to Visit  Medication Sig Dispense Refill  . amLODipine (NORVASC) 5 MG tablet Take 0.5 tablets (2.5 mg total) by mouth daily. For one week then increase to 1 tab daily 90 tablet 0  . calcium-vitamin D 250-100 MG-UNIT tablet Take 1 tablet by mouth 1 day or 1 dose.    . Cholecalciferol (VITAMIN D) 2000 units CAPS Take 3 capsules (6,000 Units total) by mouth daily. 30 capsule   . co-enzyme Q-10 30 MG capsule Take 100 mg by mouth daily.    Marland Kitchen levothyroxine (SYNTHROID, LEVOTHROID) 175 MCG  tablet Take 1 tablet (175 mcg total) by mouth daily before breakfast. 90 tablet 3  . magnesium oxide (MAG-OX) 400 MG tablet Take 2 tablets by mouth daily.    . metoprolol succinate (TOPROL-XL) 100 MG 24 hr tablet 1 q am 90 tablet 0  . milk thistle 175 MG tablet Take 175 mg by mouth daily.    . Omega-3 Fatty Acids (FISH OIL) 1200 MG CAPS Take 2 capsules by mouth daily.    . SUMAtriptan (IMITREX) 100 MG tablet Take 1 tablet (100 mg total) by mouth once. May repeat in 2 hours if headache persists or recurs. 10 tablet 2  . TURMERIC PO Take 2 tablets by mouth daily.    . Zinc Gluconate 100 MG TABS Take 1 tablet by mouth daily.     No current facility-administered medications on file prior to visit.     No Known Allergies   Review of Systems  Constitutional: Negative.  Negative for chills, diaphoresis, fever, malaise/fatigue and weight loss.  HENT: Negative.  Negative for congestion, sore throat and tinnitus.   Eyes: Negative.  Negative for blurred vision, double vision and photophobia.  Respiratory: Negative.  Negative for cough and wheezing.   Cardiovascular: Negative.  Negative for chest pain and palpitations.  Gastrointestinal: Negative.  Negative for blood in stool, diarrhea, nausea and vomiting.  Genitourinary: Negative.  Negative for dysuria, frequency and urgency.    Musculoskeletal: Negative.  Negative for joint pain and myalgias.  Skin: Negative.  Negative for itching and rash.  Neurological: Negative.  Negative for dizziness, focal weakness, weakness and headaches.  Endo/Heme/Allergies: Negative.  Negative for environmental allergies and polydipsia. Does not bruise/bleed easily.  Psychiatric/Behavioral: Negative.  Negative for depression and memory loss. The patient is not nervous/anxious and does not have insomnia.     Objective:   Blood pressure (!) 164/88, pulse 77, height 5' 8"  (1.727 m), weight 265 lb 14.4 oz (120.6 kg). Body mass index is 40.43 kg/m.  General: Well Developed, well nourished, and in no acute distress.  HEENT: Normocephalic, atraumatic, pupils equal round reactive to light, neck supple, No carotid bruits, no JVD Skin: Warm and dry, cap RF less 2 sec Cardiac: Regular rate and rhythm, S1, S2 WNL's, no murmurs rubs or gallops Respiratory: ECTA B/L, Not using accessory muscles, speaking in full sentences. NeuroM-Sk: Ambulates w/o assistance, moves ext * 4 w/o difficulty, sensation grossly intact.  Ext: no pitting edema b/l lower ext Psych: No HI/SI, judgement and insight good, Euthymic mood. Full Affect.

## 2016-01-03 NOTE — Assessment & Plan Note (Addendum)
BP still NOT at goal.  8/31--> we doubled her toprol XL 9/28--> added norvasc TODAY:  Still not at goal- > add chlorthalidone - next OV reck bmp  Lifestyle changes such as dash diet, wt loss encouraged and engaging in a regular exercise program discussed with patient.   Educational handouts provided  Ambulatory BP monitoring encouraged. Keep log and bring in next OV  Also, risks and benefits of medications discussed with patient, including alternative treatments.   Encouraged patient to read drug information handouts to further educate self about the medicine prior to starting it.   Contact us prior with any Q's/ concerns.

## 2016-01-03 NOTE — Assessment & Plan Note (Signed)
Pt very interested in wt loss.   Encouraged to track foods.  Handouts given

## 2016-01-03 NOTE — Assessment & Plan Note (Signed)
>>  ASSESSMENT AND PLAN FOR HTN (HYPERTENSION) WRITTEN ON 01/03/2016  5:56 PM BY OPALSKI, DEBORAH, DO  BP still NOT at goal.  8/31--> we doubled her toprol XL 9/28--> added norvasc TODAY:  Still not at goal- > add chlorthalidone - next OV reck bmp  Lifestyle changes such as dash diet, wt loss encouraged and engaging in a regular exercise program discussed with patient.   Educational handouts provided  Ambulatory BP monitoring encouraged. Keep log and bring in next OV  Also, risks and benefits of medications discussed with patient, including alternative treatments.   Encouraged patient to read drug information handouts to further educate self about the medicine prior to starting it.   Contact us prior with any Q's/ concerns.

## 2016-01-03 NOTE — Assessment & Plan Note (Signed)
>>  ASSESSMENT AND PLAN FOR BMI 40.0-44.9, ADULT ([facility]) WRITTEN ON 01/03/2016  5:55 PM BY OPALSKI, DEBORAH, DO  Pt very interested in wt loss.   Encouraged to track foods.  Handouts given

## 2016-01-03 NOTE — Patient Instructions (Signed)
Guidelines for Losing Weight   We want weight loss that will last so you should lose 1-2 pounds a week.  THAT IS IT! Please pick THREE things a month to change. Once it is a habit check off the item. Then pick another three items off the list to become habits.  If you are already doing a habit on the list GREAT!  Cross that item off!  Don't drink your calories. Ie, alcohol, soda, fruit juice, and sweet tea.   Drink more water. Drink a glass when you feel hungry or before each meal.   Eat breakfast - Complex carb and protein (likeDannon light and fit yogurt, oatmeal, fruit, eggs, [place] bacon).  Measure your cereal.  Eat no more than one cup a day. (ie Kashi)  Eat an apple a day.  Add a vegetable a day.  Try a new vegetable a month.  Use Pam! Stop using oil or butter to cook.  Don't finish your plate or use smaller plates.  Share your dessert.  Eat sugar free Jello for dessert or frozen grapes.  Don't eat 2-3 hours before bed.  Switch to whole wheat bread, pasta, and brown rice.  Make healthier choices when you eat out. No fries!  Pick baked chicken, NOT fried.  Don't forget to SLOW DOWN when you eat. It is not going anywhere.   Take the stairs.  Park far away in the parking lot  Lift soup cans (or weights) for 10 minutes while watching TV.  Walk at work for 10 minutes during break.  Walk outside 1 time a week with your friend, kids, dog, or significant other.  Start a walking group at church.  Walk the mall as much as you can tolerate.   Keep a food diary.  Weigh yourself daily.  Walk for 15 minutes 3 days per week.  Cook at home more often and eat out less. If life happens and you go back to old habits, it is okay.  Just start over. You can do it!  If you experience chest pain, get short of breath, or tired during the exercise, please stop immediately and inform your doctor.    Before you even begin to attack a weight-loss plan, it pays to remember  this: You are not fat. You have fat. Losing weight isn't about blame or shame; it's simply another achievement to accomplish. Dieting is like any other skill-you have to buckle down and work at it. As long as you act in a smart, reasonable way, you'll ultimately get where you want to be. Here are some weight loss pearls for you.   1. It's Not a Diet. It's a Lifestyle Thinking of a diet as something you're on and suffering through only for the short term doesn't work. To shed weight and keep it off, you need to make permanent changes to the way you eat. It's OK to indulge occasionally, of course, but if you cut calories temporarily and then revert to your old way of eating, you'll gain back the weight quicker than you can say yo-yo. Use it to lose it. Research shows that one of the best predictors of long-term weight loss is how many pounds you drop in the first month. For that reason, nutritionists often suggest being stricter for the first two weeks of your new eating strategy to build momentum. Cut out added sugar and alcohol and avoid unrefined carbs. After that, figure out how you can reincorporate them in a way that's healthy and  maintainable.  2. There's a Right Way to Exercise Working out burns calories and fat and boosts your metabolism by building muscle. But those trying to lose weight are notorious for overestimating the number of calories they burn and underestimating the amount they take in. Unfortunately, your system is biologically programmed to hold on to extra pounds and that means when you start exercising, your body senses the deficit and ramps up its hunger signals. If you're not diligent, you'll eat everything you burn and then some. Use it, to lose it. Cardio gets all the exercise glory, but strength and interval training are the real heroes. They help you build lean muscle, which in turn increases your metabolism and calorie-burning ability 3. Don't Overreact to Mild Hunger Some  people have a hard time losing weight because of hunger anxiety. To them, being hungry is bad-something to be avoided at all costs-so they carry snacks with them and eat when they don't need to. Others eat because they're stressed out or bored. While you never want to get to the point of being ravenous (that's when bingeing is likely to happen), a hunger pang, a craving, or the fact that it's 3:00 p.m. should not send you racing for the vending machine or obsessing about the energy bar in your purse. Ideally, you should put off eating until your stomach is growling and it's difficult to concentrate.  Use it to lose it. When you feel the urge to eat, use the HALT method. Ask yourself, Am I really hungry? Or am I angry or anxious, lonely or bored, or tired? If you're still not certain, try the apple test. If you're truly hungry, an apple should seem delicious; if it doesn't, something else is going on. Or you can try drinking water and making yourself busy, if you are still hungry try a healthy snack.  4. Not All Calories Are Created Equal The mechanics of weight loss are pretty simple: Take in fewer calories than you use for energy. But the kind of food you eat makes all the difference. Processed food that's high in saturated fat and refined starch or sugar can cause inflammation that disrupts the hormone signals that tell your brain you're full. The result: You eat a lot more.  Use it to lose it. Clean up your diet. Swap in whole, unprocessed foods, including vegetables, lean protein, and healthy fats that will fill you up and give you the biggest nutritional bang for your calorie buck. In a few weeks, as your brain starts receiving regular hunger and fullness signals once again, you'll notice that you feel less hungry overall and naturally start cutting back on the amount you eat.  5. Protein, Produce, and Plant-Based Fats Are Your Weight-Loss Trinity Here's why eating the three Ps regularly will help you  drop pounds. Protein fills you up. You need it to build lean muscle, which keeps your metabolism humming so that you can torch more fat. People in a weight-loss program who ate double the recommended daily allowance for protein (about 110 grams for a 150-pound woman) lost 70 percent of their weight from fat, while people who ate the RDA lost only about 40 percent, one study found. Produce is packed with filling fiber. "It's very difficult to consume too many calories if you're eating a lot of vegetables. Example: Three cups of broccoli is a lot of food, yet only 93 calories. (Fruit is another story. It can be easy to overeat and can contain a lot of calories  from sugar, so be sure to monitor your intake.) Plant-based fats like olive oil and those in avocados and nuts are healthy and extra satiating.  Use it to lose it. Aim to incorporate each of the three Ps into every meal and snack. People who eat protein throughout the day are able to keep weight off, according to a study in the [place] of Clinical Nutrition. In addition to meat, poultry and seafood, good sources are beans, lentils, eggs, tofu, and yogurt. As for fat, keep portion sizes in check by measuring out salad dressing, oil, and nut butters (shoot for one to two tablespoons). Finally, eat veggies or a little fruit at every meal. People who did that consumed 308 fewer calories but didn't feel any hungrier than when they didn't eat more produce.  7. How You Eat Is As Important As What You Eat In order for your brain to register that you're full, you need to focus on what you're eating. Sit down whenever you eat, preferably at a table. Turn off the TV or computer, put down your phone, and look at your food. Smell it. Chew slowly, and don't put another bite on your fork until you swallow. When women ate lunch this attentively, they consumed 30 percent less when snacking later than those who listened to an audiobook at lunchtime, according to a  study in the [place] of Nutrition. 8. Weighing Yourself Really Works The scale provides the best evidence about whether your efforts are paying off. Seeing the numbers tick up or down or stagnate is motivation to keep going-or to rethink your approach. A 2015 study at [facility] found that daily weigh-ins helped people lose more weight, keep it off, and maintain that loss, even after two years. Use it to lose it. Step on the scale at the same time every day for the best results. If your weight shoots up several pounds from one weigh-in to the next, don't freak out. Eating a lot of salt the night before or having your period is the likely culprit. The number should return to normal in a day or two. It's a steady climb that you need to do something about. 9. Too Much Stress and Too Little Sleep Are Your Enemies When you're tired and frazzled, your body cranks up the production of cortisol, the stress hormone that can cause carb cravings. Not getting enough sleep also boosts your levels of ghrelin, a hormone associated with hunger, while suppressing leptin, a hormone that signals fullness and satiety. People on a diet who slept only five and a half hours a night for two weeks lost 55 percent less fat and were hungrier than those who slept eight and a half hours, according to a study in the [place]. Use it to lose it. Prioritize sleep, aiming for seven hours or more a night, which research shows helps lower stress. And make sure you're getting quality zzz's. If a snoring spouse or a fidgety cat wakes you up frequently throughout the night, you may end up getting the equivalent of just four hours of sleep, according to a study from [facility]. Keep pets out of the bedroom, and use a white-noise app to drown out snoring. 10. You Will Hit a plateau-And You Can Bust Through It As you slim down, your body releases much less leptin, the fullness hormone.  If you're  not strength training, start right now. Building muscle can raise your metabolism to help you overcome a  plateau. To keep your body challenged and burning calories, incorporate new moves and more intense intervals into your workouts or add another sweat session to your weekly routine. Alternatively, cut an extra 100 calories or so a day from your diet. Now that you've lost weight, your body simply doesn't need as much fuel.      Since food equals calories, in order to lose weight you must either eat fewer calories, exercise more to burn off calories with activity, or both. Food that is not used to fuel the body is stored as fat. A major component of losing weight is to make smarter food choices. Here's how:  1)   Limit non-nutritious foods, such as: Sugar, honey, syrups and candy Pastries, donuts, pies, cakes and cookies Soft drinks, sweetened juices and alcoholic beverages  2)  Cut down on high-fat foods by: - Choosing poultry, fish or lean red meat - Choosing low-fat cooking methods, such as baking, broiling, steaming, grilling and boiling - Using low-fat or non-fat dairy products - Using vinaigrette, herbs, lemon or fat-free salad dressings - Avoiding fatty meats, such as bacon, sausage, franks, ribs and luncheon meats - Avoiding high-fat snacks like nuts, chips and chocolate - Avoiding fried foods - Using less butter, margarine, oil and mayonnaise - Avoiding high-fat gravies, cream sauces and cream-based soups  3) Eat a variety of foods, including: - Fruit and vegetables that are raw, steamed or baked - Whole grains, breads, cereal, rice and pasta - Dairy products, such as low-fat or non-fat milk or yogurt, low-fat cottage cheese and low-fat cheese - Protein-rich foods like chicken, [place], fish, lean meat and legumes, or beans  4) Change your eating habits by: - Eat three balanced meals a day to help control your hunger - Watch portion sizes and eat small servings of a variety  of foods - Choose low-calorie snacks - Eat only when you are hungry and stop when you are satisfied - Eat slowly and try not to perform other tasks while eating - Find other activities to distract you from food, such as walking, taking up a hobby or being involved in the community - Include regular exercise in your daily routine ( minimum of 20 min of moderate-intensity exercise at least 5 days/week)  - Find a support group, if necessary, for emotional support in your weight loss journey      Easy ways to cut 100 calories  1. Eat your eggs with hot sauce OR salsa instead of cheese.  Eggs are great for breakfast, but many people consider eggs and cheese to be BFFs. Instead of cheese-1 oz. of cheddar has 114 calories-top your eggs with hot sauce, which contains no calories and helps with satiety and metabolism. Salsa is also a great option!!  2. Top your toast, waffles or pancakes with fresh berries instead of jelly or syrup. Half a cup of berries-fresh, frozen or thawed-has about 40 calories, compared with 2 tbsp. of maple syrup or jelly, which both have about 100 calories. The berries will also give you a good punch of fiber, which helps keep you full and satisfied and won't spike blood sugar quickly like the jelly or syrup. 3. Swap the non-fat latte for black coffee with a splash of half-and-half. Contrary to its name, that non-fat latte has 130 calories and a startling 19g of carbohydrates per 16 oz. serving. Replacing that 'light' drinkable dessert with a black coffee with a splash of half-and-half saves you more than 100 calories per 16 oz.  serving. 4. Sprinkle salads with freeze-dried raspberries instead of dried cranberries. If you want a sweet addition to your nutritious salad, stay away from dried cranberries. They have a whopping 130 calories per  cup and 30g carbohydrates. Instead, sprinkle freeze-dried raspberries guilt-free and save more than 100 calories per  cup serving, adding 3g  of belly-filling fiber. 5. Go for mustard in place of mayo on your sandwich. Mustard can add really nice flavor to any sandwich, and there are tons of varieties, from spicy to honey. A serving of mayo is 95 calories, versus 10 calories in a serving of mustard.  Or try an avocado mayo spread: You can find the recipe few click this link: https://www.californiaavocado.com/recipes/recipe-container/[place]-avocado-mayo 6. Choose a DIY salad dressing instead of the store-bought kind. Mix Dijon or whole grain mustard with low-fat Kefir or red wine vinegar and garlic. 7. Use hummus as a spread instead of a dip. Use hummus as a spread on a high-fiber cracker or tortilla with a sandwich and save on calories without sacrificing taste. 8. Pick just one salad "accessory." Salad isn't automatically a calorie winner. It's easy to over-accessorize with toppings. Instead of topping your salad with nuts, avocado and cranberries (all three will clock in at 313 calories), just pick one. The next day, choose a different accessory, which will also keep your salad interesting. You don't wear all your jewelry every day, right? 9. Ditch the white pasta in favor of spaghetti squash. One cup of cooked spaghetti squash has about 40 calories, compared with traditional spaghetti, which comes with more than 200. Spaghetti squash is also nutrient-dense. It's a good source of fiber and Vitamins A and C, and it can be eaten just like you would eat pasta-with a great tomato sauce and [place] meatballs or with pesto, tofu and spinach, for example. 10. Dress up your chili, soups and stews with non-fat [place] yogurt instead of sour cream. Just a 'dollop' of sour cream can set you back 115 calories and a whopping 12g of fat-seven of which are of the artery-clogging variety. Added bonus: [place] yogurt is packed with muscle-building protein, calcium and B Vitamins. 11. Mash cauliflower instead of mashed potatoes. One cup of traditional  mashed potatoes-in all their creamy goodness-has more than 200 calories, compared to mashed cauliflower, which you can typically eat for less than 100 calories per 1 cup serving. Cauliflower is a great source of the antioxidant indole-3-carbinol (I3C), which may help reduce the risk of some cancers, like breast cancer. 12. Ditch the ice cream sundae in favor of a [place] yogurt parfait. Instead of a cup of ice cream or fro-yo for dessert, try 1 cup of nonfat Greek yogurt topped with fresh berries and a sprinkle of cacao nibs. Both toppings are packed with antioxidants, which can help reduce cellular inflammation and oxidative damage. And the comparison is a no-brainer: One cup of ice cream has about 275 calories; one cup of frozen yogurt has about 230; and a cup of Greek yogurt has just 130, plus twice the protein, so you're less likely to return to the freezer for a second helping. 13. Put olive oil in a spray container instead of using it directly from the bottle. Each tablespoon of olive oil is 120 calories and 15g of fat. Use a mister instead of pouring it straight into the pan or onto a salad. This allows for portion control and will save you more than 100 calories. 14. When baking, substitute canned pumpkin for butter or oil. Canned  pumpkin-not pumpkin pie mix-is loaded with Vitamin A, which is important for skin and eye health, as well as immunity. And the comparisons are pretty crazy:  cup of canned pumpkin has about 40 calories, compared to butter or oil, which has more than 800 calories. Yes, 800 calories. Applesauce and mashed banana can also serve as good substitutions for butter or oil, usually in a 1:1 ratio. 15. Top casseroles with high-fiber cereal instead of breadcrumbs. Breadcrumbs are typically made with white bread, while breakfast cereals contain 5-9g of fiber per serving. Not only will you save more than 150 calories per  cup serving, the swap will also keep you more full and you'll get  a metabolism boost from the added fiber. 16. Snack on pistachios instead of macadamia nuts. Believe it or not, you get the same amount of calories from 35 pistachios (100 calories) as you would from only five macadamia nuts. 17. Chow down on kale chips rather than potato chips. This is my favorite 'don't knock it 'till you try it' swap. Kale chips are so easy to make at home, and you can spice them up with a little grated parmesan or chili powder. Plus, they're a mere fraction of the calories of potato chips, but with the same crunch factor we crave so often. 18. Add seltzer and some fruit slices to your cocktail instead of soda or fruit juice. One cup of soda or fruit juice can pack on as much as 140 calories. Instead, use seltzer and fruit slices. The fruit provides valuable phytochemicals, such as flavonoids and anthocyanins, which help to combat cancer and stave off the aging process.

## 2016-01-20 ENCOUNTER — Other Ambulatory Visit: Payer: Self-pay | Admitting: Family Medicine

## 2016-02-10 ENCOUNTER — Ambulatory Visit (INDEPENDENT_AMBULATORY_CARE_PROVIDER_SITE_OTHER): Payer: BLUE CROSS/BLUE SHIELD | Admitting: Family Medicine

## 2016-02-10 ENCOUNTER — Encounter: Payer: Self-pay | Admitting: Family Medicine

## 2016-02-10 VITALS — BP 128/81 | HR 73 | Ht 68.0 in | Wt 261.7 lb

## 2016-02-10 DIAGNOSIS — E782 Mixed hyperlipidemia: Secondary | ICD-10-CM

## 2016-02-10 DIAGNOSIS — K760 Fatty (change of) liver, not elsewhere classified: Secondary | ICD-10-CM

## 2016-02-10 DIAGNOSIS — E559 Vitamin D deficiency, unspecified: Secondary | ICD-10-CM

## 2016-02-10 DIAGNOSIS — I10 Essential (primary) hypertension: Secondary | ICD-10-CM | POA: Diagnosis not present

## 2016-02-10 DIAGNOSIS — E038 Other specified hypothyroidism: Secondary | ICD-10-CM

## 2016-02-10 DIAGNOSIS — R7303 Prediabetes: Secondary | ICD-10-CM

## 2016-02-10 DIAGNOSIS — R748 Abnormal levels of other serum enzymes: Secondary | ICD-10-CM

## 2016-02-10 NOTE — Patient Instructions (Addendum)
BP-  CONT TO MONITOR BP AT HOME    BMP today to check renal and lytes after starting CHLOthadone  She will come in in 2 months for repeat vit D, FLP, ast, alt, A1c, Lab only ov.

## 2016-02-10 NOTE — Progress Notes (Signed)
Impression and Recommendations:    1. Essential hypertension   2. Obesity, Class III, BMI 40-49.9 (morbid obesity) ([place])   3. Mixed hyperlipidemia   4. Vitamin D deficiency   5. Prediabetes   6. Low serum HDL   7. Other specified hypothyroidism   8. Fatty liver disease, nonalcoholic   9. Elevated liver enzymes    -- BP-  CONT TO MONITOR BP AT HOME -- BMP today to check renal fxn and lytes after starting CHLOthadone She will come in in 2 months for repeat vit D, FLP, ast, alt, A1c, Lab only ov.    F/up HTN and MMP 4 months or so with me for OV  She will come in in 2 months for repeat vit D, FLP, ast, alt, A1c, Lab only ov.   BP - well controlled.   Once holidays over--> f/up to discuss diet/ exercise so we can get back at it.   Cont to focus on prudent diet and regular exercise- esp in [place] Yr.  tsh- stable when last checked.   Elevated liver enzymes- stable though, had [place] in past.   Education and routine counseling performed. Handouts provided.   New Prescriptions   No medications on file    Modified Medications   No medications on file    Discontinued Medications   AMLODIPINE (NORVASC) 5 MG TABLET    Take 0.5 tablets (2.5 mg total) by mouth daily. For one week then increase to 1 tab daily   CO-ENZYME Q-10 30 MG CAPSULE    Take 100 mg by mouth daily.    Orders Placed This Encounter  Procedures  . Basic Metabolic Panel (BMET)    Return for 2 mo- Lab only and then OV w/ me in 4 mo--> BP, Wt etc.  The patient was counseled, risk factors were discussed, anticipatory guidance given.  Gross side effects, risk and benefits, and alternatives of medications discussed with patient.  Patient is aware that all medications have potential side effects and we are unable to predict every side effect or drug-drug interaction that may occur.  Expresses verbal understanding and consents to current therapy plan and treatment regimen.  Please see AVS handed out to patient  at the end of our visit for further patient instructions/ counseling done pertaining to today's office visit.    Note: This document was prepared using Dragon voice recognition software and may include unintentional dictation errors.     Subjective:    Chief Complaint  Patient presents with  . Follow-up  . Hypertension    HPI: Miranda Delacruz is a 55 y.o. female who presents to [place] at [facility] today for follow up for HTN.     HTN: Home BP readings have been running in the 120's-130's/ 60-80's in general.    Pt has been tolerating meds well.   Taking as prescribed.  Denies HA, dizziness, CP, SOB, Visual changes, increasing pedal edema.   Exercise: no;  Diet Pattern:  Pt did bad this month due to holidays.  Eating more bread;   Salt Restriction:   Yes.  - no problems adding chlorothalidone.   Tol well and good control on BP.    BP not well controlled on 2.5 amlodipine so pt went back up to her original dose of 3m QD     Wt up a little.  No prudent diet or exercise.  Pt plans on getting back at it after holidays.     Patient  Care Team    Relationship Specialty Notifications Start End  [name], DO PCP - General Family Medicine  09/24/15     Wt Readings from Last 3 Encounters:  02/10/16 261 lb 11.2 oz (118.7 kg)  01/03/16 265 lb 14.4 oz (120.6 kg)  11/21/15 263 lb 6.4 oz (119.5 kg)    BP Readings from Last 3 Encounters:  02/10/16 128/81  01/03/16 (!) 164/88  11/21/15 (!) 147/83    Pulse Readings from Last 3 Encounters:  02/10/16 73  01/03/16 77  11/21/15 83    BMI Readings from Last 3 Encounters:  02/10/16 39.79 kg/m  01/03/16 40.43 kg/m  11/21/15 40.05 kg/m     Lab Results  Component Value Date   CREATININE 0.84 10/15/2015   BUN 9 10/15/2015   NA 139 10/15/2015   K 4.4 10/15/2015   CL 102 10/15/2015   CO2 27 10/15/2015    Lab Results  Component Value Date   CHOL 234 (H) 10/15/2015   CHOL 254 (H) 12/07/2014    Lab  Results  Component Value Date   HDL 30 (L) 10/15/2015   HDL 36 (L) 12/07/2014    Lab Results  Component Value Date   LDLCALC 133 (H) 10/15/2015   LDLCALC 149 (H) 12/07/2014    Lab Results  Component Value Date   TRIG 357 (H) 10/15/2015   TRIG 347 (H) 12/07/2014    Lab Results  Component Value Date   CHOLHDL 7.8 (H) 10/15/2015   CHOLHDL 7.1 (H) 12/07/2014    No results found for: LDLDIRECT ===================================================================  Patient Active Problem List   Diagnosis Date Noted  . Hyperlipidemia 12/10/2014    Priority: High  . Obesity, Class III, BMI 40-49.9 (morbid obesity) ([place]) 11/21/2014    Priority: High  . HTN (hypertension) 11/21/2014    Priority: High  . Prediabetes 10/24/2015  . Low serum HDL 10/24/2015  . Menopause present 09/25/2015  . Migraine headache with aura 02/11/2015  . Special screening for malignant neoplasms, colon   . Benign neoplasm of ascending colon   . Benign neoplasm of transverse colon   . Benign neoplasm of sigmoid colon   . Fatty liver disease, nonalcoholic 39/04/90  . Elevated liver enzymes 12/10/2014  . Vitamin D deficiency 12/10/2014  . Hypothyroidism 11/21/2014  . Hx of papillary thyroid cancer 11/21/2014    Past Medical History:  Diagnosis Date  . Cancer ([place])   . Hyperlipidemia   . Hypertension   . Thyroid disease     Past Surgical History:  Procedure Laterality Date  . COLONOSCOPY  2000   normal  . COLONOSCOPY WITH PROPOFOL N/A 01/22/2015   Procedure: COLONOSCOPY WITH PROPOFOL;  Surgeon: Lucilla Lame, MD;  Location: [facility] ENDOSCOPY;  Service: Endoscopy;  Laterality: N/A;  . THYROIDECTOMY    . TONSILLECTOMY    . TUBAL LIGATION      Family History  Problem Relation Age of Onset  . Hypertension Mother   . Hypertension Maternal Grandmother   . Stroke Maternal Grandmother     History  Drug Use No  ,  History  Alcohol Use  . 0.0 oz/week  ,  History  Smoking Status  . Former  Smoker  . Quit date: 02/24/1987  Smokeless Tobacco  . Never Used  ,    Current Outpatient Prescriptions on File Prior to Visit  Medication Sig Dispense Refill  . calcium-vitamin D 250-100 MG-UNIT tablet Take 1 tablet by mouth 1 day or 1 dose.    . chlorthalidone (  HYGROTON) 25 MG tablet Take 1 tablet (25 mg total) by mouth daily. 90 tablet 1  . Cholecalciferol (VITAMIN D) 2000 units CAPS Take 3 capsules (6,000 Units total) by mouth daily. 30 capsule   . levothyroxine (SYNTHROID, LEVOTHROID) 175 MCG tablet Take 1 tablet (175 mcg total) by mouth daily before breakfast. 90 tablet 3  . magnesium oxide (MAG-OX) 400 MG tablet Take 2 tablets by mouth daily.    . metoprolol succinate (TOPROL-XL) 100 MG 24 hr tablet TAKE ONE TABLET BY MOUTH IN THE MORNING 90 tablet 0  . milk thistle 175 MG tablet Take 175 mg by mouth daily.    . Omega-3 Fatty Acids (FISH OIL) 1200 MG CAPS Take 2 capsules by mouth daily.    . SUMAtriptan (IMITREX) 100 MG tablet Take 1 tablet (100 mg total) by mouth once. May repeat in 2 hours if headache persists or recurs. 10 tablet 2  . TURMERIC PO Take 2 tablets by mouth daily.    . Zinc Gluconate 100 MG TABS Take 1 tablet by mouth daily.     No current facility-administered medications on file prior to visit.     No Known Allergies  Review of Systems  Constitutional: Negative for chills and fever.  Respiratory: Negative for shortness of breath.   Gastrointestinal: Negative for nausea and vomiting.  Neurological: Negative for dizziness and headaches.    Objective:   Blood pressure 128/81, pulse 73, height 5' 8"  (1.727 m), weight 261 lb 11.2 oz (118.7 kg). Body mass index is 39.79 kg/m. General: Well Developed, well nourished, and in no acute distress.  HEENT: Normocephalic, atraumatic, pupils equal round reactive to light, neck supple, No carotid bruits, no JVD Skin: Warm and dry, cap RF less 2 sec Cardiac: Regular rate and rhythm, S1, S2 WNL's, no murmurs rubs or  gallops Respiratory: ECTA B/L, Not using accessory muscles, speaking in full sentences. NeuroM-Sk: Ambulates w/o assistance, moves ext * 4 w/o difficulty, sensation grossly intact.  Ext: scant edema b/l lower ext Psych: No HI/SI, judgement and insight good, Euthymic mood. Full Affect.

## 2016-02-11 LAB — BASIC METABOLIC PANEL
BUN: 13 mg/dL (ref 7–25)
CHLORIDE: 96 mmol/L — AB (ref 98–110)
CO2: 31 mmol/L (ref 20–31)
CREATININE: 0.81 mg/dL (ref 0.50–1.05)
Calcium: 9.8 mg/dL (ref 8.6–10.4)
Glucose, Bld: 177 mg/dL — ABNORMAL HIGH (ref 65–99)
Potassium: 4 mmol/L (ref 3.5–5.3)
Sodium: 139 mmol/L (ref 135–146)

## 2016-02-21 ENCOUNTER — Other Ambulatory Visit: Payer: Self-pay | Admitting: Family Medicine

## 2016-02-25 ENCOUNTER — Telehealth: Payer: Self-pay | Admitting: Family Medicine

## 2016-02-25 NOTE — Telephone Encounter (Signed)
Patient is requesting a refill of her amlodipine, she is completely out and would like to have a refill sent in today

## 2016-04-13 ENCOUNTER — Other Ambulatory Visit: Payer: BLUE CROSS/BLUE SHIELD

## 2016-04-14 ENCOUNTER — Other Ambulatory Visit (INDEPENDENT_AMBULATORY_CARE_PROVIDER_SITE_OTHER): Payer: BLUE CROSS/BLUE SHIELD

## 2016-04-14 ENCOUNTER — Other Ambulatory Visit: Payer: Self-pay

## 2016-04-14 DIAGNOSIS — R7303 Prediabetes: Secondary | ICD-10-CM

## 2016-04-14 DIAGNOSIS — E782 Mixed hyperlipidemia: Secondary | ICD-10-CM

## 2016-04-14 DIAGNOSIS — I10 Essential (primary) hypertension: Secondary | ICD-10-CM

## 2016-04-15 ENCOUNTER — Other Ambulatory Visit: Payer: Self-pay | Admitting: Adult Health

## 2016-04-15 DIAGNOSIS — E119 Type 2 diabetes mellitus without complications: Secondary | ICD-10-CM

## 2016-04-15 LAB — COMPREHENSIVE METABOLIC PANEL
ALBUMIN: 4.7 g/dL (ref 3.5–5.5)
ALK PHOS: 71 IU/L (ref 39–117)
ALT: 216 IU/L — ABNORMAL HIGH (ref 0–32)
AST: 164 IU/L — ABNORMAL HIGH (ref 0–40)
Albumin/Globulin Ratio: 2.6 — ABNORMAL HIGH (ref 1.2–2.2)
BUN / CREAT RATIO: 21 (ref 9–23)
BUN: 14 mg/dL (ref 6–24)
Bilirubin Total: 1.3 mg/dL — ABNORMAL HIGH (ref 0.0–1.2)
CO2: 29 mmol/L (ref 18–29)
CREATININE: 0.68 mg/dL (ref 0.57–1.00)
Calcium: 9.4 mg/dL (ref 8.7–10.2)
Chloride: 93 mmol/L — ABNORMAL LOW (ref 96–106)
GFR calc non Af Amer: 100 (ref >59)
GFR, EST AFRICAN AMERICAN: 115 (ref >59)
GLOBULIN, TOTAL: 1.8 (ref 1.5–4.5)
Glucose: 230 mg/dL — ABNORMAL HIGH (ref 65–99)
Potassium: 3.8 mmol/L (ref 3.5–5.2)
SODIUM: 141 mmol/L (ref 134–144)
TOTAL PROTEIN: 6.5 g/dL (ref 6.0–8.5)

## 2016-04-15 LAB — HEMOGLOBIN A1C
Est. average glucose Bld gHb Est-mCnc: 137
Hgb A1c MFr Bld: 6.4 % — ABNORMAL HIGH (ref 4.8–5.6)

## 2016-04-15 LAB — LIPID PANEL
CHOLESTEROL TOTAL: 235 mg/dL — AB (ref 100–199)
Chol/HDL Ratio: 7.6 — ABNORMAL HIGH (ref 0.0–4.4)
HDL: 31 mg/dL — ABNORMAL LOW (ref >39)
LDL Calculated: 135 — ABNORMAL HIGH (ref 0–99)
Triglycerides: 347 mg/dL — ABNORMAL HIGH (ref 0–149)
VLDL CHOLESTEROL CAL: 69 — AB (ref 5–40)

## 2016-04-15 MED ORDER — METFORMIN HCL 500 MG PO TABS: 500.0000 mg | ORAL_TABLET | Freq: Every day | ORAL | 0 refills | Status: DC

## 2016-04-15 NOTE — Progress Notes (Unsigned)
met 

## 2016-04-21 ENCOUNTER — Other Ambulatory Visit: Payer: Self-pay | Admitting: Family Medicine

## 2016-05-04 ENCOUNTER — Telehealth: Payer: Self-pay | Admitting: Family Medicine

## 2016-05-04 NOTE — Telephone Encounter (Signed)
Pt clld states needs Tonya to give her a call back @ [phone] Did " no"t say reason or ill. --glh

## 2016-05-05 NOTE — Telephone Encounter (Signed)
LVM for pt to return call.  T. Nelson, CMA 

## 2016-05-05 NOTE — Telephone Encounter (Signed)
Pt requested information regarding MyChart message about abdominal pain.  Pt wanted to know if this pain would indicated a liver problem.  Advised pt that this is one of the symptoms that may indicate liver problems.  Pt is not currently having any pain.  Pt expressed understanding and is agreeable.  Charyl Bigger, CMA

## 2016-05-26 ENCOUNTER — Other Ambulatory Visit: Payer: Self-pay | Admitting: Family Medicine

## 2016-06-30 ENCOUNTER — Other Ambulatory Visit: Payer: Self-pay | Admitting: Family Medicine

## 2016-07-14 ENCOUNTER — Ambulatory Visit: Payer: BLUE CROSS/BLUE SHIELD | Admitting: Family Medicine

## 2016-07-23 ENCOUNTER — Other Ambulatory Visit: Payer: Self-pay | Admitting: Family Medicine

## 2016-07-27 ENCOUNTER — Encounter: Payer: Self-pay | Admitting: Family Medicine

## 2016-07-28 ENCOUNTER — Other Ambulatory Visit: Payer: Self-pay

## 2016-07-28 DIAGNOSIS — E782 Mixed hyperlipidemia: Secondary | ICD-10-CM

## 2016-07-28 DIAGNOSIS — R748 Abnormal levels of other serum enzymes: Secondary | ICD-10-CM

## 2016-07-28 DIAGNOSIS — Z780 Asymptomatic menopausal state: Secondary | ICD-10-CM

## 2016-07-28 DIAGNOSIS — R7303 Prediabetes: Secondary | ICD-10-CM

## 2016-07-28 DIAGNOSIS — I10 Essential (primary) hypertension: Secondary | ICD-10-CM

## 2016-07-28 DIAGNOSIS — E559 Vitamin D deficiency, unspecified: Secondary | ICD-10-CM

## 2016-07-31 ENCOUNTER — Ambulatory Visit (INDEPENDENT_AMBULATORY_CARE_PROVIDER_SITE_OTHER): Payer: BLUE CROSS/BLUE SHIELD | Admitting: Family Medicine

## 2016-07-31 ENCOUNTER — Encounter: Payer: Self-pay | Admitting: Family Medicine

## 2016-07-31 ENCOUNTER — Telehealth: Payer: Self-pay

## 2016-07-31 VITALS — BP 137/84 | HR 60 | Ht 68.0 in | Wt 248.0 lb

## 2016-07-31 DIAGNOSIS — Z124 Encounter for screening for malignant neoplasm of cervix: Secondary | ICD-10-CM | POA: Diagnosis not present

## 2016-07-31 DIAGNOSIS — Z7189 Other specified counseling: Secondary | ICD-10-CM | POA: Diagnosis not present

## 2016-07-31 DIAGNOSIS — K760 Fatty (change of) liver, not elsewhere classified: Secondary | ICD-10-CM | POA: Diagnosis not present

## 2016-07-31 DIAGNOSIS — R7303 Prediabetes: Secondary | ICD-10-CM | POA: Diagnosis not present

## 2016-07-31 DIAGNOSIS — Z1211 Encounter for screening for malignant neoplasm of colon: Secondary | ICD-10-CM | POA: Diagnosis not present

## 2016-07-31 DIAGNOSIS — I10 Essential (primary) hypertension: Secondary | ICD-10-CM | POA: Diagnosis not present

## 2016-07-31 DIAGNOSIS — Z1239 Encounter for other screening for malignant neoplasm of breast: Secondary | ICD-10-CM | POA: Insufficient documentation

## 2016-07-31 DIAGNOSIS — Z1231 Encounter for screening mammogram for malignant neoplasm of breast: Secondary | ICD-10-CM

## 2016-07-31 LAB — POCT GLYCOSYLATED HEMOGLOBIN (HGB A1C): HEMOGLOBIN A1C: 6

## 2016-07-31 NOTE — Patient Instructions (Signed)
Please schedule a Diabetic Eye Exam.  Please schedule mammogram.

## 2016-07-31 NOTE — Assessment & Plan Note (Signed)
Patient prefers to get mammogram only every 2-3 years.  She doesn't want the excess exposure to radiation and understands the risks associated with not getting yearly screenings.

## 2016-07-31 NOTE — Progress Notes (Signed)
Impression and Recommendations:    1. Prediabetes   2. Obesity, Class III, BMI 40-49.9 (morbid obesity) ([place])   3. Essential hypertension   4. Fatty liver disease, nonalcoholic   5. Counseling on health promotion and disease prevention   6. Special screening for malignant neoplasms, colon   7. Cervical cancer screening   8. Screening for breast cancer     Special screening for malignant neoplasms, colon Last colonoscopy done in November 2016.  Had 3 precancerous polyps.  Repeat 5 years.  Cervical cancer screening Last Pap smear was done in 2016.   Is due every 3 years.  No history of abnormal.  Screening for breast cancer Patient prefers to get mammogram only every 2-3 years.  She doesn't want the excess exposure to radiation and understands the risks associated with not getting yearly screenings.    Education and routine counseling performed. Handouts provided.   Orders Placed This Encounter  Procedures  . POCT HgB A1C  . POCT UA - Microalbumin   Return in about 4 months (around 11/30/2016) for One month for fasting labs, 4 months office visit with me..  The patient was counseled, risk factors were discussed, anticipatory guidance given.  Gross side effects, risk and benefits, and alternatives of medications discussed with patient.  Patient is aware that all medications have potential side effects and we are unable to predict every side effect or drug-drug interaction that may occur.  Expresses verbal understanding and consents to current therapy plan and treatment regimen.  Please see AVS handed out to patient at the end of our visit for further patient instructions/ counseling done pertaining to today's office visit.    Note: This document was prepared using Dragon voice recognition software and may include unintentional dictation errors.     Subjective:    Chief Complaint  Patient presents with  . Diabetes  . Hypertension    HPI: Miranda Delacruz is a 56  y.o. female who presents to [place] at [facility] today for follow up for HTN.       HTN:  -  Her blood pressure has been controlled at home.   - Patient reports good compliance with blood pressure medications  - Denies medication S-E   - Smoking Status noted   - She denies new onset of: chest pain, exercise intolerance, shortness of breath, dizziness, visual changes, headache, lower extremity swelling or claudication.   Today their BP is BP: 137/84   Last 3 blood pressure readings in our office are as follows: BP Readings from Last 3 Encounters:  02/08/17 (!) 162/86  12/01/16 (!) 146/81  10/01/16 139/89    Pulse Readings from Last 3 Encounters:  02/08/17 76  12/01/16 62  10/01/16 80    Filed Weights   07/31/16 0930  Weight: 248 lb (112.5 kg)      Patient Care Team    Relationship Specialty Notifications Start End  [name], DO PCP - General Family Medicine  09/24/15   Abisogun, Domenica Reamer, MD Referring Physician Endocrinology  10/01/16    Comment: [facility]- [place], [place]     Lab Results  Component Value Date   CREATININE 0.72 11/23/2016   BUN 11 11/23/2016   NA 145 (H) 11/23/2016   K 4.4 11/23/2016   CL 104 11/23/2016   CO2 26 11/23/2016    Lab Results  Component Value Date   CHOL 261 (H) 11/23/2016   CHOL 235 (H) 04/14/2016   CHOL  234 (H) 10/15/2015    Lab Results  Component Value Date   HDL 41 11/23/2016   HDL 31 (L) 04/14/2016   HDL 30 (L) 10/15/2015    Lab Results  Component Value Date   LDLCALC 164 (H) 11/23/2016   LDLCALC 135 (H) 04/14/2016   LDLCALC 133 (H) 10/15/2015    Lab Results  Component Value Date   TRIG 282 (H) 11/23/2016   TRIG 347 (H) 04/14/2016   TRIG 357 (H) 10/15/2015    Lab Results  Component Value Date   CHOLHDL 6.4 (H) 11/23/2016   CHOLHDL 7.6 (H) 04/14/2016   CHOLHDL 7.8 (H) 10/15/2015    No results found for:  LDLDIRECT ===================================================================   Patient Active Problem List   Diagnosis Date Noted  . Prediabetes 10/24/2015    Priority: High  . Migraine headache with aura 02/11/2015    Priority: High  . Hyperlipidemia 12/10/2014    Priority: High  . Obesity, Class III, BMI 40-49.9 (morbid obesity) ([place]) 11/21/2014    Priority: High  . HTN (hypertension) 11/21/2014    Priority: High  . Hypothyroidism 11/21/2014    Priority: Medium  . Hx of papillary thyroid cancer 11/21/2014    Priority: Medium  . Counseling on health promotion and disease prevention 07/31/2016    Priority: Low  . Fatty liver disease, nonalcoholic 26/33/3545    Priority: Low  . Elevated liver enzymes- from fatty liver disease. 12/10/2014    Priority: Low  . Vitamin D deficiency 12/10/2014    Priority: Low  . Dysuria 02/08/2017  . Abnormal urinalysis 02/08/2017  . Cervical cancer screening 07/31/2016  . Screening for breast cancer 07/31/2016  . Low serum HDL 10/24/2015  . Menopause present 09/25/2015  . Special screening for malignant neoplasms, colon   . Benign neoplasm of ascending colon   . Benign neoplasm of transverse colon   . Benign neoplasm of sigmoid colon      Past Medical History:  Diagnosis Date  . Cancer ([place])   . Hyperlipidemia   . Hypertension   . Thyroid disease      Past Surgical History:  Procedure Laterality Date  . COLONOSCOPY  2000   normal  . COLONOSCOPY WITH PROPOFOL N/A 01/22/2015   Procedure: COLONOSCOPY WITH PROPOFOL;  Surgeon: Lucilla Lame, MD;  Location: [facility] ENDOSCOPY;  Service: Endoscopy;  Laterality: N/A;  . THYROIDECTOMY    . TONSILLECTOMY    . TUBAL LIGATION       Family History  Problem Relation Age of Onset  . Hypertension Mother   . Hypertension Maternal Grandmother   . Stroke Maternal Grandmother      Social History   Substance and Sexual Activity  Drug Use No  ,  Social History   Substance and Sexual  Activity  Alcohol Use Yes  . Alcohol/week: 0.0 oz  ,  Social History   Tobacco Use  Smoking Status Former Smoker  . Last attempt to quit: 02/24/1987  . Years since quitting: 30.0  Smokeless Tobacco Never Used  ,    Current Outpatient Medications on File Prior to Visit  Medication Sig Dispense Refill  . Co-Enzyme Q10 200 MG CAPS Take 400 mg by mouth daily.     . magnesium oxide (MAG-OX) 400 MG tablet Take 2 tablets by mouth daily.    . milk thistle 175 MG tablet Take 175 mg by mouth daily.    . Omega-3 Fatty Acids (FISH OIL) 1200 MG CAPS Take 2 capsules by mouth daily.    Marland Kitchen  TURMERIC PO Take 2 tablets by mouth daily.    Marland Kitchen zinc gluconate 50 MG tablet Take 1 tablet by mouth daily.     No current facility-administered medications on file prior to visit.      No Known Allergies   Review of Systems:   General:  Denies fever, chills Optho/Auditory:   Denies visual changes, blurred vision Respiratory:   Denies SOB, cough, wheeze, DIB  Cardiovascular:   Denies chest pain, palpitations, painful respirations Gastrointestinal:   Denies nausea, vomiting, diarrhea.  Endocrine:     Denies new hot or cold intolerance Musculoskeletal:  Denies joint swelling, gait issues, or new unexplained myalgias/ arthralgias Skin:  Denies rash, suspicious lesions  Neurological:    Denies dizziness, unexplained weakness, numbness  Psychiatric/Behavioral:   Denies mood changes  Objective:    Blood pressure 137/84, pulse 60, height 5' 8"  (1.727 m), weight 248 lb (112.5 kg).  Body mass index is 37.71 kg/m.  General: Well Developed, well nourished, and in no acute distress.  HEENT: Normocephalic, atraumatic, pupils equal round reactive to light, neck supple, No carotid bruits, no JVD Skin: Warm and dry, cap RF less 2 sec Cardiac: Regular rate and rhythm, S1, S2 WNL's, no murmurs rubs or gallops Respiratory: ECTA B/L, Not using accessory muscles, speaking in full sentences. NeuroM-Sk: Ambulates w/o  assistance, moves ext * 4 w/o difficulty, sensation grossly intact.  Ext: scant edema b/l lower ext Psych: No HI/SI, judgement and insight good, Euthymic mood. Full Affect.

## 2016-07-31 NOTE — Telephone Encounter (Signed)
Spoke with [facility] to see if additional lab work could be added.  Unfortunately not enough blood was drawn.  Patient will have to have additional lab work done in office.

## 2016-07-31 NOTE — Assessment & Plan Note (Signed)
Last Pap smear was done in 2016.   Is due every 3 years.  No history of abnormal.

## 2016-07-31 NOTE — Assessment & Plan Note (Signed)
Last colonoscopy done in November 2016.  Had 3 precancerous polyps.  Repeat 5 years.

## 2016-08-27 ENCOUNTER — Other Ambulatory Visit: Payer: Self-pay | Admitting: Adult Health

## 2016-09-01 ENCOUNTER — Other Ambulatory Visit: Payer: BLUE CROSS/BLUE SHIELD

## 2016-09-21 ENCOUNTER — Encounter: Payer: Self-pay | Admitting: Family Medicine

## 2016-09-24 ENCOUNTER — Other Ambulatory Visit: Payer: BLUE CROSS/BLUE SHIELD

## 2016-10-01 ENCOUNTER — Encounter: Payer: Self-pay | Admitting: Family Medicine

## 2016-10-01 ENCOUNTER — Ambulatory Visit (INDEPENDENT_AMBULATORY_CARE_PROVIDER_SITE_OTHER): Payer: BLUE CROSS/BLUE SHIELD | Admitting: Family Medicine

## 2016-10-01 VITALS — BP 139/89 | HR 80 | Ht 68.0 in | Wt 243.0 lb

## 2016-10-01 DIAGNOSIS — R7303 Prediabetes: Secondary | ICD-10-CM

## 2016-10-01 NOTE — Progress Notes (Signed)
Impression and Recommendations:    1. Prediabetes      No problem-specific Assessment & Plan notes found for this encounter.   The patient was counseled, risk factors were discussed, anticipatory guidance given.   New Prescriptions   No medications on file     Discontinued Medications   No medications on file     Modified Medications   No medications on file      Orders Placed This Encounter  Procedures  . POCT UA - Microalbumin     Gross side effects, risk and benefits, and alternatives of medications and treatment plan in general discussed with patient.  Patient is aware that all medications have potential side effects and we are unable to predict every side effect or drug-drug interaction that may occur.   Patient will call with any questions prior to using medication if they have concerns.  Expresses verbal understanding and consents to current therapy and treatment regimen.  No barriers to understanding were identified.  Red flag symptoms and signs discussed in detail.  Patient expressed understanding regarding what to do in case of emergency\urgent symptoms  Please see AVS handed out to patient at the end of our visit for further patient instructions/ counseling done pertaining to today's office visit.   Return for f/up with me for OV in 57mo obtain bldwrk as scheduled.     Note: This document was prepared using Dragon voice recognition software and may include unintentional dictation errors.  Barrie Wale 11:07 AM --------------------------------------------------------------------------------------------------------------------------------------------------------------------------------------------------------------------------------------------    Subjective:    CC:  Chief Complaint  Patient presents with  . Follow-up    discuss medication change    HPI: JLizeth Bencosmeis a 56y.o. female [facility].  -Took self off amlodipine to see if BP would bve affected about 3 wks ago--> actually per pt--> bp's have remained stable or come down a bit.   - She also cut her dose in half of her chlorothailidone in half---> now Bp's at home m been running in the 142-120/ 84-67.    - Pt has changed her diet- eating more salt, more veggies, little lean meats,  Exercising 359m  5 d / wk at least.   Doing wts couple days/ week.    Sticking herself several times per day and checking BS's mulitple times per day  No problems updated.   Wt Readings from Last 3 Encounters:  10/01/16 243 lb (110.2 kg)  07/31/16 248 lb (112.5 kg)  02/10/16 261 lb 11.2 oz (118.7 kg)   BP Readings from Last 3 Encounters:  10/01/16 139/89  07/31/16 137/84  02/10/16 128/81   Pulse Readings from Last 3 Encounters:  10/01/16 80  07/31/16 60  02/10/16 73   BMI Readings from Last 3 Encounters:  10/01/16 36.95 kg/m  07/31/16 37.71 kg/m  02/10/16 39.79 kg/m     Patient Care Team    Relationship Specialty Notifications Start End  OpMellody DanceDO PCP - General Family Medicine  09/24/15   Abisogun, AbDomenica ReamerMD Referring Physician Endocrinology  10/01/16    Comment: [facility] [place] [place]     Patient Active Problem List   Diagnosis Date Noted  . Migraine headache with aura 02/11/2015    Priority: High  . Hyperlipidemia 12/10/2014    Priority: High  . Obesity, Class III, BMI 40-49.9 (morbid obesity) ([place]    Priority: High  .  HTN (hypertension) 11/21/2014    Priority: High  . Hypothyroidism 11/21/2014    Priority: Medium  . Hx of papillary thyroid cancer 11/21/2014    Priority: Medium  . Counseling on health promotion and disease prevention 07/31/2016    Priority: Low  . Fatty liver disease, nonalcoholic 44/04/4740    Priority: Low  . Elevated liver enzymes- from fatty liver disease. 12/10/2014    Priority: Low  . Vitamin D  deficiency 12/10/2014    Priority: Low  . Cervical cancer screening 07/31/2016  . Screening for breast cancer 07/31/2016  . Prediabetes 10/24/2015  . Low serum HDL 10/24/2015  . Menopause present 09/25/2015  . Special screening for malignant neoplasms, colon   . Benign neoplasm of ascending colon   . Benign neoplasm of transverse colon   . Benign neoplasm of sigmoid colon     Past Medical history, Surgical history, Family history, Social history, Allergies and Medications have been entered into the medical record, reviewed and changed as needed.    Current Meds  Medication Sig  . amLODipine (NORVASC) 5 MG tablet TAKE 1 TABLET BY MOUTH ONCE DAILY  . calcium-vitamin D 250-100 MG-UNIT tablet Take 1 tablet by mouth 1 day or 1 dose.  . chlorthalidone (HYGROTON) 25 MG tablet TAKE ONE TABLET BY MOUTH ONCE DAILY  . Cholecalciferol (VITAMIN D) 2000 units CAPS Take 3 capsules (6,000 Units total) by mouth daily.  Marland Kitchen Co-Enzyme Q10 100 MG CAPS Take 200 mg by mouth daily.  Marland Kitchen CRANBERRY FRUIT PO Take 14,000 mg by mouth daily.  Marland Kitchen levothyroxine (SYNTHROID, LEVOTHROID) 175 MCG tablet Take 1 tablet (175 mcg total) by mouth daily before breakfast.  . magnesium oxide (MAG-OX) 400 MG tablet Take 2 tablets by mouth daily.  . metFORMIN (GLUCOPHAGE) 500 MG tablet Take 1 tablet (500 mg total) by mouth daily after supper.  . metoprolol succinate (TOPROL-XL) 100 MG 24 hr tablet TAKE 1 TABLET BY MOUTH IN THE MORNING  . milk thistle 175 MG tablet Take 175 mg by mouth daily.  . Omega-3 Fatty Acids (FISH OIL) 1200 MG CAPS Take 2 capsules by mouth daily.  . SUMAtriptan (IMITREX) 100 MG tablet Take 1 tablet (100 mg total) by mouth once. May repeat in 2 hours if headache persists or recurs.  . TURMERIC PO Take 2 tablets by mouth daily.  . Zinc Gluconate 100 MG TABS Take 1 tablet by mouth daily.    Allergies:  No Known Allergies   Review of Systems: General:   Denies fever, chills, unexplained weight loss.    Optho/Auditory:   Denies visual changes, blurred vision/LOV Respiratory:   Denies wheeze, DOE more than baseline levels.  Cardiovascular:   Denies chest pain, palpitations, new onset peripheral edema  Gastrointestinal:   Denies nausea, vomiting, diarrhea, abd pain.  Genitourinary: Denies dysuria, freq/ urgency, flank pain or discharge from genitals.  Endocrine:     Denies hot or cold intolerance, polyuria, polydipsia. Musculoskeletal:   Denies unexplained myalgias, joint swelling, unexplained arthralgias, gait problems.  Skin:  Denies new onset rash, suspicious lesions Neurological:     Denies dizziness, unexplained weakness, numbness  Psychiatric/Behavioral:   Denies mood changes, suicidal or homicidal ideations, hallucinations    Objective:   Blood pressure 139/89, pulse 80, height 5' 8"  (1.727 m), weight 243 lb (110.2 kg). Body mass index is 36.95 kg/m. General:  Well Developed, well nourished, appropriate for stated age.  Neuro:  Alert and oriented,  extra-ocular muscles intact  HEENT:  Normocephalic, atraumatic, neck supple,  no carotid bruits appreciated  Skin:  no gross rash, warm, pink. Cardiac:  RRR, S1 S2 Respiratory:  ECTA B/L and A/P, Not using accessory muscles, speaking in full sentences- unlabored. Vascular:  Ext warm, no cyanosis apprec.; cap RF less 2 sec. Psych:  No HI/SI, judgement and insight good, Euthymic mood. Full Affect.

## 2016-10-01 NOTE — Patient Instructions (Addendum)
- [email or web address]  - Please make sure you update your med list with the medical assistant prior to leaving office today.   Monitor blood sugars fasting which is first thing in the morning before you eat or drink anything as well as 2 hours after largest meal of the day.  Otherwise only check it at other times during the day if you feel poorly or have some new symptoms are worried about.     Blood Glucose Monitoring, Adult Monitoring your blood sugar (glucose) helps you manage your diabetes. It also helps you and your health care provider determine how well your diabetes management plan is working. Blood glucose monitoring involves checking your blood glucose as often as directed, and keeping a record (log) of your results over time. Why should I monitor my blood glucose? Checking your blood glucose regularly can:  Help you understand how food, exercise, illnesses, and medicines affect your blood glucose.  Let you know what your blood glucose is at any time. You can quickly tell if you are having low blood glucose (hypoglycemia) or high blood glucose (hyperglycemia).  Help you and your health care provider adjust your medicines as needed.  When should I check my blood glucose? Follow instructions from your health care provider about how often to check your blood glucose. This may depend on:  The type of diabetes you have.  How well-controlled your diabetes is.  Medicines you are taking.  If you have type 1 diabetes:  Check your blood glucose at least 2 times a day.  Also check your blood glucose: ? Before every insulin injection. ? Before and after exercise. ? Between meals. ? 2 hours after a meal. ? Occasionally between 2:00 a.m. and 3:00 a.m., as directed. ? Before potentially dangerous tasks, like driving or using heavy machinery. ? At bedtime.  You may need to check your blood glucose more often, up to 6-10 times a day: ? If you use an insulin pump. ? If you need  multiple daily injections (MDI). ? If your diabetes is not well-controlled. ? If you are ill. ? If you have a history of severe hypoglycemia. ? If you have a history of not knowing when your blood glucose is getting low (hypoglycemia unawareness). If you have type 2 diabetes:  If you take insulin or other diabetes medicines, check your blood glucose at least 2 times a day.  If you are on intensive insulin therapy, check your blood glucose at least 4 times a day. Occasionally, you may also need to check between 2:00 a.m. and 3:00 a.m., as directed.  Also check your blood glucose: ? Before and after exercise. ? Before potentially dangerous tasks, like driving or using heavy machinery.  You may need to check your blood glucose more often if: ? Your medicine is being adjusted. ? Your diabetes is not well-controlled. ? You are ill. What is a blood glucose log?  A blood glucose log is a record of your blood glucose readings. It helps you and your health care provider: ? Look for patterns in your blood glucose over time. ? Adjust your diabetes management plan as needed.  Every time you check your blood glucose, write down your result and notes about things that may be affecting your blood glucose, such as your diet and exercise for the day.  Most glucose meters store a record of glucose readings in the meter. Some meters allow you to download your records to a computer. How do I check my blood  glucose? Follow these steps to get accurate readings of your blood glucose: Supplies needed   Blood glucose meter.  Test strips for your meter. Each meter has its own strips. You must use the strips that come with your meter.  A needle to prick your finger (lancet). Do not use lancets more than once.  A device that holds the lancet (lancing device).  A journal or log book to write down your results. Procedure  Wash your hands with soap and water.  Prick the side of your finger (not the  tip) with the lancet. Use a different finger each time.  Gently rub the finger until a small drop of blood appears.  Follow instructions that come with your meter for inserting the test strip, applying blood to the strip, and using your blood glucose meter.  Write down your result and any notes. Alternative testing sites  Some meters allow you to use areas of your body other than your finger (alternative sites) to test your blood.  If you think you may have hypoglycemia, or if you have hypoglycemia unawareness, do not use alternative sites. Use your finger instead.  Alternative sites may not be as accurate as the fingers, because blood flow is slower in these areas. This means that the result you get may be delayed, and it may be different from the result that you would get from your finger.  The most common alternative sites are: ? Forearm. ? Thigh. ? Palm of the hand. Additional tips  Always keep your supplies with you.  If you have questions or need help, all blood glucose meters have a 24-hour "hotline" number that you can call. You may also contact your health care provider.  After you use a few boxes of test strips, adjust (calibrate) your blood glucose meter by following instructions that came with your meter. This information is not intended to replace advice given to you by your health care provider. Make sure you discuss any questions you have with your health care provider. Document Released: 02/12/2003 Document Revised: 08/30/2015 Document Reviewed: 07/22/2015 Elsevier Interactive Patient Education  2017 [place] factors for prediabetes and type 2 diabetes  Researchers don't fully understand why some people develop prediabetes and type 2 diabetes and others don't.  It's clear that certain factors increase the risk, however, including:  Weight. The more fatty tissue you have, the more resistant your cells become to insulin.  Inactivity. The less active you  are, the greater your risk. Physical activity helps you control your weight, uses up glucose as energy and makes your cells more sensitive to insulin.  Family history. Your risk increases if a parent or sibling has type 2 diabetes.  Race. Although it's unclear why, people of certain races - including blacks, Hispanics, American Indians and Asian-Americans - are at higher risk.  Age. Your risk increases as you get older. This may be because you tend to exercise less, lose muscle mass and gain weight as you age. But type 2 diabetes is also increasing dramatically among children, adolescents and younger adults.  Gestational diabetes. If you developed gestational diabetes when you were pregnant, your risk of developing prediabetes and type 2 diabetes later increases. If you gave birth to a baby weighing more than 9 pounds (4 kilograms), you're also at risk of type 2 diabetes.  Polycystic ovary syndrome. For women, having polycystic ovary syndrome - a common condition characterized by irregular menstrual periods, excess hair growth and obesity - increases  the risk of diabetes.  High blood pressure. Having blood pressure over 140/90 millimeters of mercury (mm Hg) is linked to an increased risk of type 2 diabetes.  Abnormal cholesterol and triglyceride levels. If you have low levels of high-density lipoprotein (HDL), or "good," cholesterol, your risk of type 2 diabetes is higher. Triglycerides are another type of fat carried in the blood. People with high levels of triglycerides have an increased risk of type 2 diabetes. Your doctor can let you know what your cholesterol and triglyceride levels are.  A good guide to good carbs: The glycemic index ---If you have diabetes, or at risk for diabetes, you know all too well that when you eat carbohydrates, your blood sugar goes up. The total amount of carbs you consume at a meal or in a snack mostly determines what your blood sugar will do. But the food itself also  plays a role. A serving of white rice has almost the same effect as eating pure table sugar - a quick, high spike in blood sugar. A serving of lentils has a slower, smaller effect.  ---Picking good sources of carbs can help you control your blood sugar and your weight. Even if you don't have diabetes, eating healthier carbohydrate-rich foods can help ward off a host of chronic conditions, from heart disease to various cancers to, well, diabetes.  ---One way to choose foods is with the glycemic index (GI). This tool measures how much a food boosts blood sugar.  The glycemic index rates the effect of a specific amount of a food on blood sugar compared with the same amount of pure glucose. A food with a glycemic index of 28 boosts blood sugar only 28% as much as pure glucose. One with a GI of 95 acts like pure glucose.    High glycemic foods result in a quick spike in insulin and blood sugar (also known as blood glucose).  Low glycemic foods have a slower, smaller effect- these are healthier for you.   Using the glycemic index Using the glycemic index is easy: choose foods in the low GI category instead of those in the high GI category (see below), and go easy on those in between. Low glycemic index (GI of 55 or less): Most fruits and vegetables, beans, minimally processed grains, pasta, low-fat dairy foods, and nuts.  Moderate glycemic index (GI 56 to 69): White and sweet potatoes, corn, white rice, couscous, breakfast cereals such as Cream of Wheat and Mini Wheats.  High glycemic index (GI of 70 or higher): White bread, rice cakes, most crackers, bagels, cakes, doughnuts, croissants, most packaged breakfast cereals. You can see the values for 100 commons foods and get links to more at www.health.CheapToothpicks.si.  Swaps for lowering glycemic index  Instead of this high-glycemic index food Eat this lower-glycemic index food  White rice Brown rice or converted rice  Instant oatmeal Steel-cut  oats  Cornflakes Bran flakes  Baked potato Pasta, bulgur  White bread Whole-grain bread  Corn Peas or leafy greens       Prediabetes Eating Plan  Prediabetes--also called impaired glucose tolerance or impaired fasting glucose--is a condition that causes blood sugar (blood glucose) levels to be higher than normal. Following a healthy diet can help to keep prediabetes under control. It can also help to lower the risk of type 2 diabetes and heart disease, which are increased in people who have prediabetes. Along with regular exercise, a healthy diet:  Promotes weight loss.  Helps to control blood  sugar levels.  Helps to improve the way that the body uses insulin.   WHAT DO I NEED TO KNOW ABOUT THIS EATING PLAN?   Use the glycemic index (GI) to plan your meals. The index tells you how quickly a food will raise your blood sugar. Choose low-GI foods. These foods take a longer time to raise blood sugar.  Pay close attention to the amount of carbohydrates in the food that you eat. Carbohydrates increase blood sugar levels.  Keep track of how many calories you take in. Eating the right amount of calories will help you to achieve a healthy weight. Losing about 7 percent of your starting weight can help to prevent type 2 diabetes.  You may want to follow a Mediterranean diet. This diet includes a lot of vegetables, lean meats or fish, whole grains, fruits, and healthy oils and fats.   WHAT FOODS CAN I EAT?  Grains Whole grains, such as whole-wheat or whole-grain breads, crackers, cereals, and pasta. Unsweetened oatmeal. Bulgur. Barley. Quinoa. Brown rice. Corn or whole-wheat flour tortillas or taco shells. Vegetables Lettuce. Spinach. Peas. Beets. Cauliflower. Cabbage. Broccoli. Carrots. Tomatoes. Squash. Eggplant. Herbs. Peppers. Onions. Cucumbers. Brussels sprouts. Fruits Berries. Bananas. Apples. Oranges. Grapes. Papaya. Mango. Pomegranate. Kiwi. Grapefruit. Cherries. Meats and Other  Protein Sources Seafood. Lean meats, such as chicken and [place] or lean cuts of pork and beef. Tofu. Eggs. Nuts. Beans. Dairy Low-fat or fat-free dairy products, such as yogurt, cottage cheese, and cheese. Beverages Water. Tea. Coffee. Sugar-free or diet soda. Seltzer water. Milk. Milk alternatives, such as soy or almond milk. Condiments Mustard. Relish. Low-fat, low-sugar ketchup. Low-fat, low-sugar barbecue sauce. Low-fat or fat-free mayonnaise. Sweets and Desserts Sugar-free or low-fat pudding. Sugar-free or low-fat ice cream and other frozen treats. Fats and Oils Avocado. Walnuts. Olive oil. The items listed above may not be a complete list of recommended foods or beverages. Contact your dietitian for more options.    WHAT FOODS ARE NOT RECOMMENDED?  Grains Refined white flour and flour products, such as bread, pasta, snack foods, and cereals. Beverages Sweetened drinks, such as sweet iced tea and soda. Sweets and Desserts Baked goods, such as cake, cupcakes, pastries, cookies, and cheesecake. The items listed above may not be a complete list of foods and beverages to avoid. Contact your dietitian for more information.   This information is not intended to replace advice given to you by your health care provider. Make sure you discuss any questions you have with your health care provider.   Document Released: 06/26/2014 Document Reviewed: 06/26/2014 Elsevier Interactive Patient Education [employer].

## 2016-10-02 LAB — POCT UA - MICROALBUMIN
Creatinine, POC: 200 mg/dL
Microalbumin Ur, POC: 10 mg/L

## 2016-10-09 ENCOUNTER — Encounter: Payer: Self-pay | Admitting: Family Medicine

## 2016-10-15 ENCOUNTER — Other Ambulatory Visit: Payer: BLUE CROSS/BLUE SHIELD

## 2016-10-17 ENCOUNTER — Other Ambulatory Visit: Payer: Self-pay | Admitting: Family Medicine

## 2016-10-30 ENCOUNTER — Encounter: Payer: Self-pay | Admitting: Family Medicine

## 2016-11-02 ENCOUNTER — Telehealth: Payer: Self-pay

## 2016-11-02 NOTE — Telephone Encounter (Signed)
Okay we can send her to endocrinology and nutritionist if she would like.     Please place an internal referral to endocrinology through [facility].     Please state in the referral her endocrinologist retired and she would like to follow with one for her thyroid.

## 2016-11-02 NOTE — Telephone Encounter (Signed)
Pt called back stating that she hasn't had a diagnosis of prediabetes for 5 years and that you did suggest she see endocrinologist for prediabetes.  Pt states that you were going to send her nutritional counseling for prediabetes, but then advised her to see endocrinologist since she was already seeing one.  Please advise how you would like to proceed.  Charyl Bigger, CMA

## 2016-11-03 ENCOUNTER — Telehealth: Payer: Self-pay | Admitting: Family Medicine

## 2016-11-03 ENCOUNTER — Other Ambulatory Visit: Payer: Self-pay

## 2016-11-03 DIAGNOSIS — E890 Postprocedural hypothyroidism: Secondary | ICD-10-CM

## 2016-11-03 NOTE — Telephone Encounter (Signed)
MyChart message sent to pt and referral order placed.  Charyl Bigger, CMA

## 2016-11-03 NOTE — Telephone Encounter (Signed)
LVM for pt to call to discuss.  T. Nelson, CMA  

## 2016-11-03 NOTE — Telephone Encounter (Signed)
Patient returned MA message to call office --Pt say reach her at [phone]. --glh

## 2016-11-03 NOTE — Telephone Encounter (Signed)
LVM for pt to call to discuss.  T. Mayvis Agudelo, CMA  

## 2016-11-03 NOTE — Progress Notes (Signed)
amb  

## 2016-11-23 ENCOUNTER — Other Ambulatory Visit: Payer: BLUE CROSS/BLUE SHIELD

## 2016-11-23 DIAGNOSIS — R748 Abnormal levels of other serum enzymes: Secondary | ICD-10-CM

## 2016-11-23 DIAGNOSIS — R7303 Prediabetes: Secondary | ICD-10-CM

## 2016-11-23 DIAGNOSIS — I10 Essential (primary) hypertension: Secondary | ICD-10-CM

## 2016-11-23 DIAGNOSIS — Z780 Asymptomatic menopausal state: Secondary | ICD-10-CM

## 2016-11-23 DIAGNOSIS — E559 Vitamin D deficiency, unspecified: Secondary | ICD-10-CM

## 2016-11-23 DIAGNOSIS — E782 Mixed hyperlipidemia: Secondary | ICD-10-CM

## 2016-11-23 NOTE — Addendum Note (Signed)
Addended by: Fonnie Mu on: 11/23/2016 08:45 AM   Modules accepted: Orders

## 2016-11-24 LAB — LIPID PANEL
Chol/HDL Ratio: 6.4 ratio — ABNORMAL HIGH (ref 0.0–4.4)
Cholesterol, Total: 261 mg/dL — ABNORMAL HIGH (ref 100–199)
HDL: 41 mg/dL (ref >39)
LDL CALC: 164 mg/dL — AB (ref 0–99)
Triglycerides: 282 mg/dL — ABNORMAL HIGH (ref 0–149)
VLDL CHOLESTEROL CAL: 56 mg/dL — AB (ref 5–40)

## 2016-11-24 LAB — COMPREHENSIVE METABOLIC PANEL
ALK PHOS: 65 IU/L (ref 39–117)
ALT: 31 IU/L (ref 0–32)
AST: 23 IU/L (ref 0–40)
Albumin/Globulin Ratio: 2.8 — ABNORMAL HIGH (ref 1.2–2.2)
Albumin: 4.8 g/dL (ref 3.5–5.5)
BUN/Creatinine Ratio: 15 (ref 9–23)
BUN: 11 mg/dL (ref 6–24)
Bilirubin Total: 0.7 mg/dL (ref 0.0–1.2)
CO2: 26 mmol/L (ref 20–29)
Calcium: 9.6 mg/dL (ref 8.7–10.2)
Chloride: 104 mmol/L (ref 96–106)
Creatinine, Ser: 0.72 mg/dL (ref 0.57–1.00)
GFR calc Af Amer: 110 mL/min/{1.73_m2} (ref >59)
GFR calc non Af Amer: 95 mL/min/{1.73_m2} (ref >59)
GLOBULIN, TOTAL: 1.7 g/dL (ref 1.5–4.5)
Glucose: 146 mg/dL — ABNORMAL HIGH (ref 65–99)
Potassium: 4.4 mmol/L (ref 3.5–5.2)
SODIUM: 145 mmol/L — AB (ref 134–144)
Total Protein: 6.5 g/dL (ref 6.0–8.5)

## 2016-11-24 LAB — VITAMIN D 25 HYDROXY (VIT D DEFICIENCY, FRACTURES): Vit D, 25-Hydroxy: 37.5 ng/mL (ref 30.0–100.0)

## 2016-11-24 LAB — HEMOGLOBIN A1C
ESTIMATED AVERAGE GLUCOSE: 103 mg/dL
Hgb A1c MFr Bld: 5.2 % (ref 4.8–5.6)

## 2016-12-01 ENCOUNTER — Encounter: Payer: Self-pay | Admitting: Family Medicine

## 2016-12-01 ENCOUNTER — Ambulatory Visit (INDEPENDENT_AMBULATORY_CARE_PROVIDER_SITE_OTHER): Payer: BLUE CROSS/BLUE SHIELD | Admitting: Family Medicine

## 2016-12-01 VITALS — BP 146/81 | HR 62 | Ht 68.0 in | Wt 251.0 lb

## 2016-12-01 DIAGNOSIS — I10 Essential (primary) hypertension: Secondary | ICD-10-CM

## 2016-12-01 DIAGNOSIS — R7303 Prediabetes: Secondary | ICD-10-CM

## 2016-12-01 DIAGNOSIS — E559 Vitamin D deficiency, unspecified: Secondary | ICD-10-CM

## 2016-12-01 DIAGNOSIS — Z23 Encounter for immunization: Secondary | ICD-10-CM | POA: Diagnosis not present

## 2016-12-01 DIAGNOSIS — E782 Mixed hyperlipidemia: Secondary | ICD-10-CM

## 2016-12-01 MED ORDER — PNEUMOCOCCAL VAC POLYVALENT 25 MCG/0.5ML IJ INJ: 0.5000 mL | INJECTION | INTRAMUSCULAR | Status: DC

## 2016-12-01 MED ORDER — METOPROLOL SUCCINATE ER 100 MG PO TB24: ORAL_TABLET | ORAL | 3 refills | Status: DC

## 2016-12-01 MED ORDER — VITAMIN D (ERGOCALCIFEROL) 1.25 MG (50000 UNIT) PO CAPS: 50000.0000 [IU] | ORAL_CAPSULE | ORAL | 10 refills | Status: DC

## 2016-12-01 NOTE — Assessment & Plan Note (Signed)
We will have patient take her 100 mg tablet in the mornings around 8 and she will take a half a tablet of the XL 24 hour tablet around 8 PM as well.  She will take it every 12 hours- knowing that it is a 24 hour tablet but it just seems to be wearing off for her.  She will continue to monitor her blood pressures at home.

## 2016-12-01 NOTE — Assessment & Plan Note (Signed)
>>  ASSESSMENT AND PLAN FOR HTN (HYPERTENSION) WRITTEN ON 12/01/2016  9:08 AM BY OPALSKI, DEBORAH, DO  We will have patient take her 100 mg tablet in the mornings around 8 and she will take a half a tablet of the XL 24 hour tablet around 8 PM as well.  She will take it every 12 hours- knowing that it is a 24 hour tablet but it just seems to be wearing off for her.  She will continue to monitor her blood pressures at home.

## 2016-12-01 NOTE — Assessment & Plan Note (Signed)
-   Patient will continue with diet and exercise modification to improve her cholesterol levels.  Her triglycerides have improved dramatically and her HDL has increased.  LDL still elevated at 164.    Patient declines statin and will continue to work on diet and exercise.

## 2016-12-01 NOTE — Assessment & Plan Note (Signed)
>>  ASSESSMENT AND PLAN FOR HYPERLIPIDEMIA WRITTEN ON 12/01/2016  9:24 AM BY OPALSKI, DEBORAH, DO  - Patient will continue with diet and exercise modification to improve her cholesterol levels.  Her triglycerides have improved dramatically and her HDL has increased.  LDL still elevated at 164.    Patient declines statin and will continue to work on diet and exercise.

## 2016-12-01 NOTE — Patient Instructions (Addendum)
Please try to calculate home a calories per day you are eating using the lose it app or something like that such as my fitness pal or my plate etc.  By eating less calories than you burn theoretically you should lose weight.  This will be the last good piece of the puzzle for you  Please let me know if you have a problem on the new blood pressure medication changes we made today.  Let me know sooner than later if there is problem   Managing Your Hypertension Hypertension is commonly called high blood pressure. This is when the force of your blood pressing against the walls of your arteries is too strong. Arteries are blood vessels that carry blood from your heart throughout your body. Hypertension forces the heart to work harder to pump blood, and may cause the arteries to become narrow or stiff. Having untreated or uncontrolled hypertension can cause heart attack, stroke, kidney disease, and other problems. What are blood pressure readings? A blood pressure reading consists of a higher number over a lower number. Ideally, your blood pressure should be below 120/80. The first ("top") number is called the systolic pressure. It is a measure of the pressure in your arteries as your heart beats. The second ("bottom") number is called the diastolic pressure. It is a measure of the pressure in your arteries as the heart relaxes. What does my blood pressure reading mean? Blood pressure is classified into four stages. Based on your blood pressure reading, your health care provider may use the following stages to determine what type of treatment you need, if any. Systolic pressure and diastolic pressure are measured in a unit called mm Hg. Normal  Systolic pressure: below 517.  Diastolic pressure: below 80. Elevated  Systolic pressure: 616-073.  Diastolic pressure: below 80. Hypertension stage 1  Systolic pressure: 710-626.  Diastolic pressure: 94-85. Hypertension stage 2  Systolic pressure: 462  or above.  Diastolic pressure: 90 or above. What health risks are associated with hypertension? Managing your hypertension is an important responsibility. Uncontrolled hypertension can lead to:  A heart attack.  A stroke.  A weakened blood vessel (aneurysm).  Heart failure.  Kidney damage.  Eye damage.  Metabolic syndrome.  Memory and concentration problems.  What changes can I make to manage my hypertension? Hypertension can be managed by making lifestyle changes and possibly by taking medicines. Your health care provider will help you make a plan to bring your blood pressure within a normal range. Eating and drinking  Eat a diet that is high in fiber and potassium, and low in salt (sodium), added sugar, and fat. An example eating plan is called the DASH (Dietary Approaches to Stop Hypertension) diet. To eat this way: ? Eat plenty of fresh fruits and vegetables. Try to fill half of your plate at each meal with fruits and vegetables. ? Eat whole grains, such as whole wheat pasta, brown rice, or whole grain bread. Fill about one quarter of your plate with whole grains. ? Eat low-fat diary products. ? Avoid fatty cuts of meat, processed or cured meats, and poultry with skin. Fill about one quarter of your plate with lean proteins such as fish, chicken without skin, beans, eggs, and tofu. ? Avoid premade and processed foods. These tend to be higher in sodium, added sugar, and fat.  Reduce your daily sodium intake. Most people with hypertension should eat less than 1,500 mg of sodium a day.  Limit alcohol intake to no more than  1 drink a day for nonpregnant women and 2 drinks a day for men. One drink equals 12 oz of beer, 5 oz of wine, or 1 oz of hard liquor. Lifestyle  Work with your health care provider to maintain a healthy body weight, or to lose weight. Ask what an ideal weight is for you.  Get at least 30 minutes of exercise that causes your heart to beat faster (aerobic  exercise) most days of the week. Activities may include walking, swimming, or biking.  Include exercise to strengthen your muscles (resistance exercise), such as weight lifting, as part of your weekly exercise routine. Try to do these types of exercises for 30 minutes at least 3 days a week.  Do not use any products that contain nicotine or tobacco, such as cigarettes and e-cigarettes. If you need help quitting, ask your health care provider.  Control any long-term (chronic) conditions you have, such as high cholesterol or diabetes. Monitoring  Monitor your blood pressure at home as told by your health care provider. Your personal target blood pressure may vary depending on your medical conditions, your age, and other factors.  Have your blood pressure checked regularly, as often as told by your health care provider. Working with your health care provider  Review all the medicines you take with your health care provider because there may be side effects or interactions.  Talk with your health care provider about your diet, exercise habits, and other lifestyle factors that may be contributing to hypertension.  Visit your health care provider regularly. Your health care provider can help you create and adjust your plan for managing hypertension. Will I need medicine to control my blood pressure? Your health care provider may prescribe medicine if lifestyle changes are not enough to get your blood pressure under control, and if:  Your systolic blood pressure is 130 or higher.  Your diastolic blood pressure is 80 or higher.  Take medicines only as told by your health care provider. Follow the directions carefully. Blood pressure medicines must be taken as prescribed. The medicine does not work as well when you skip doses. Skipping doses also puts you at risk for problems. Contact a health care provider if:  You think you are having a reaction to medicines you have taken.  You have repeated  (recurrent) headaches.  You feel dizzy.  You have swelling in your ankles.  You have trouble with your vision. Get help right away if:  You develop a severe headache or confusion.  You have unusual weakness or numbness, or you feel faint.  You have severe pain in your chest or abdomen.  You vomit repeatedly.  You have trouble breathing. Summary  Hypertension is when the force of blood pumping through your arteries is too strong. If this condition is not controlled, it may put you at risk for serious complications.  Your personal target blood pressure may vary depending on your medical conditions, your age, and other factors. For most people, a normal blood pressure is less than 120/80.  Hypertension is managed by lifestyle changes, medicines, or both. Lifestyle changes include weight loss, eating a healthy, low-sodium diet, exercising more, and limiting alcohol. This information is not intended to replace advice given to you by your health care provider. Make sure you discuss any questions you have with your health care provider. Document Released: 11/04/2011 Document Revised: 01/08/2016 Document Reviewed: 01/08/2016 Elsevier Interactive Patient Education  [employer].

## 2016-12-01 NOTE — Progress Notes (Signed)
Assessment and plan:  1. Essential hypertension   2. Mixed hyperlipidemia   3. Prediabetes   4. Vitamin D deficiency   5. Obesity, Class III, BMI 40-49.9 (morbid obesity) ([place])      There are no diagnoses linked to this encounter.  HTN (hypertension) We will have patient take her 100 mg tablet in the mornings around 8 and she will take a half a tablet of the XL 24 hour tablet around 8 PM as well.  She will take it every 12 hours- knowing that it is a 24 hour tablet but it just seems to be wearing off for her.  She will continue to monitor her blood pressures at home.  Hyperlipidemia - Patient will continue with diet and exercise modification to improve her cholesterol levels.  Her triglycerides have improved dramatically and her HDL has increased.  LDL still elevated at 164.    Patient declines statin and will continue to work on diet and exercise.  Pt was in the office today for 25+ minutes, with over 50% time spent in face to face counseling of patient's various medical conditions and in coordination of care  Meds ordered this encounter  Medications  . Ascorbic Acid (VITAMIN C) 1000 MG tablet    Sig: Take 1,000 mg by mouth daily.  . vitamin B-12 (CYANOCOBALAMIN) 1000 MCG tablet    Sig: Take 1,000 mcg by mouth daily.  . Garlic 2423 MG CAPS    Sig: Take 1 capsule by mouth daily.  . Cholecalciferol (VITAMIN D3) 5000 units CAPS    Sig: Take 1 capsule by mouth daily.  . Collagen 500 MG CAPS    Sig: Take 2,500 mg by mouth daily.  . Menaquinone-7 (VITAMIN K2 PO)    Sig: Take 6 mg by mouth daily.  Marland Kitchen POTASSIUM GLUCONATE PO    Sig: Take 595 mg by mouth daily.  . Chromium 1000 MCG TABS    Sig: Take 1 tablet by mouth daily.  . metoprolol succinate (TOPROL-XL) 100 MG 24 hr tablet    Sig: Take one tablet every a.m. and one half tablet every Pm ( around 12 hrs later)    Dispense:  135 tablet    Refill:  3  . Vitamin  D, Ergocalciferol, (DRISDOL) 50000 units CAPS capsule    Sig: Take 1 capsule (50,000 Units total) by mouth every 7 (seven) days.    Dispense:  12 capsule    Refill:  10    Discontinued Medications   CALCIUM-VITAMIN D 250-100 MG-UNIT TABLET    Take 1 tablet by mouth 1 day or 1 dose.   CHOLECALCIFEROL (VITAMIN D) 2000 UNITS CAPS    Take 3 capsules (6,000 Units total) by mouth daily.   CRANBERRY FRUIT PO    Take 14,000 mg by mouth daily.   METFORMIN (GLUCOPHAGE) 500 MG TABLET    Take 1 tablet (500 mg total) by mouth daily after supper.    Modified Medications   Modified Medication Previous Medication   METOPROLOL SUCCINATE (TOPROL-XL) 100 MG 24 HR TABLET metoprolol succinate (TOPROL-XL) 100 MG 24 hr tablet      Take one tablet every a.m. and one half tablet every Pm ( around 12 hrs later)    TAKE 1 TABLET BY MOUTH IN THE MORNING     No orders of the defined types were placed in this encounter.    Return in about 4 months (around 04/03/2017) for htn, A1c, Vit D, go over  lose it app/ or the like. Marland Kitchen  Anticipatory guidance and routine counseling done re: condition, txmnt options and need for follow up. All questions of patient's were answered.   Gross side effects, risk and benefits, and alternatives of medications discussed with patient.  Patient is aware that all medications have potential side effects and we are unable to predict every sideeffect or drug-drug interaction that may occur.  Expresses verbal understanding and consents to current therapy plan and treatment regiment.  Please see AVS handed out to patient at the end of our visit for additional patient instructions/ counseling done pertaining to today's office visit.  Note: This document was prepared using Dragon voice recognition software and may include unintentional dictation errors.   ----------------------------------------------------------------------------------------------------------------------  Subjective:   CC:    Miranda Delacruz is a 56 y.o. female who presents to [place] at [facility] today for review and discussion of recent bloodwork that was done.  1. All recent blood work that we ordered was reviewed with patient today.  Patient was counseled on all abnormalities and we discussed dietary and lifestyle changes that could help those values (also medications when appropriate).  Extensive health counseling performed and all patient's concerns/ questions were addressed.  2. Bp at home- runs lower in afternoon arousdn 130/70's- she is taking her metoprolol XL usually late morning and finds that her blood pressures well controlled in the afternoons and not in the mornings.  She has no headache, dizziness, chest pain etc.  - She also stopped the chlorthalidone about 2 months ago as well.  Her blood pressure has been pretty well controlled on just the above metoprolol  3. PRe-Dm- patient was given the metformin way back in February 2018 but never took it.  She's done it through diet and exercise.   FBS- at home running 120's.  Pt continues on her no-carb/ palleo type diet.  Feels great.    4. Endo- changed her thyroid dose of meds in May 2018-->  Now taking 184mg every day.    [facility]  There is another endocrinologist as that is now taking over her care.  She has never seen them for her prediabetes.    5. For her vitamin D she been taking 5000 daily and the D level is 37.5.  We will put her on a once weekly in addition to her 5000 daily    Wt Readings from Last 3 Encounters:  12/01/16 251 lb (113.9 kg)  10/01/16 243 lb (110.2 kg)  07/31/16 248 lb (112.5 kg)   BP Readings from Last 3 Encounters:  12/01/16 (!) 146/81  10/01/16 139/89  07/31/16 137/84   Pulse Readings from Last 3 Encounters:  12/01/16 62  10/01/16 80  07/31/16 60   BMI Readings from Last 3 Encounters:  12/01/16 38.16 kg/m  10/01/16 36.95 kg/m  07/31/16 37.71 kg/m     Patient Care Team      Relationship Specialty Notifications Start End  OMellody Dance DO PCP - General Family Medicine  09/24/15   Abisogun, ADomenica Reamer MD Referring Physician Endocrinology  10/01/16    Comment: [facility] [place] [place]    Full medical history updated and reviewed in the office today  Patient Active Problem List   Diagnosis Date Noted  . Prediabetes 10/24/2015    Priority: High  . Migraine headache with aura 02/11/2015    Priority: High  . Hyperlipidemia 12/10/2014    Priority: High  . Obesity, Class III,  BMI 40-49.9 (morbid obesity) ([place]) 11/21/2014    Priority: High  . HTN (hypertension) 11/21/2014    Priority: High  . Hypothyroidism 11/21/2014    Priority: Medium  . Hx of papillary thyroid cancer 11/21/2014    Priority: Medium  . Counseling on health promotion and disease prevention 07/31/2016    Priority: Low  . Fatty liver disease, nonalcoholic 18/56/3149    Priority: Low  . Elevated liver enzymes- from fatty liver disease. 12/10/2014    Priority: Low  . Vitamin D deficiency 12/10/2014    Priority: Low  . Cervical cancer screening 07/31/2016  . Screening for breast cancer 07/31/2016  . Low serum HDL 10/24/2015  . Menopause present 09/25/2015  . Special screening for malignant neoplasms, colon   . Benign neoplasm of ascending colon   . Benign neoplasm of transverse colon   . Benign neoplasm of sigmoid colon     Past Medical History:  Diagnosis Date  . Cancer ([place])   . Hyperlipidemia   . Hypertension   . Thyroid disease     Past Surgical History:  Procedure Laterality Date  . COLONOSCOPY  2000   normal  . COLONOSCOPY WITH PROPOFOL N/A 01/22/2015   Procedure: COLONOSCOPY WITH PROPOFOL;  Surgeon: Lucilla Lame, MD;  Location: [facility] ENDOSCOPY;  Service: Endoscopy;  Laterality: N/A;  . THYROIDECTOMY    . TONSILLECTOMY    . TUBAL LIGATION      Social History  Substance Use Topics  . Smoking status: Former Smoker    Quit date: 02/24/1987  . Smokeless  tobacco: Never Used  . Alcohol use 0.0 oz/week    Family Hx: Family History  Problem Relation Age of Onset  . Hypertension Mother   . Hypertension Maternal Grandmother   . Stroke Maternal Grandmother      Medications: Current Outpatient Prescriptions  Medication Sig Dispense Refill  . Ascorbic Acid (VITAMIN C) 1000 MG tablet Take 1,000 mg by mouth daily.    . Cholecalciferol (VITAMIN D3) 5000 units CAPS Take 1 capsule by mouth daily.    . Chromium 1000 MCG TABS Take 1 tablet by mouth daily.    Marland Kitchen Co-Enzyme Q10 200 MG CAPS Take 400 mg by mouth daily.     . Collagen 500 MG CAPS Take 2,500 mg by mouth daily.    . Garlic 7026 MG CAPS Take 1 capsule by mouth daily.    Marland Kitchen levothyroxine (SYNTHROID, LEVOTHROID) 175 MCG tablet Take 1 tablet (175 mcg total) by mouth daily before breakfast. 90 tablet 3  . magnesium oxide (MAG-OX) 400 MG tablet Take 2 tablets by mouth daily.    . Menaquinone-7 (VITAMIN K2 PO) Take 6 mg by mouth daily.    . metoprolol succinate (TOPROL-XL) 100 MG 24 hr tablet Take one tablet every a.m. and one half tablet every Pm ( around 12 hrs later) 135 tablet 3  . milk thistle 175 MG tablet Take 175 mg by mouth daily.    . Omega-3 Fatty Acids (FISH OIL) 1200 MG CAPS Take 2 capsules by mouth daily.    Marland Kitchen POTASSIUM GLUCONATE PO Take 595 mg by mouth daily.    . TURMERIC PO Take 2 tablets by mouth daily.    . vitamin B-12 (CYANOCOBALAMIN) 1000 MCG tablet Take 1,000 mcg by mouth daily.    Marland Kitchen zinc gluconate 50 MG tablet Take 1 tablet by mouth daily.    . chlorthalidone (HYGROTON) 25 MG tablet TAKE ONE TABLET BY MOUTH ONCE DAILY (Patient taking differently: take 1/2 tablet  daily) 90 tablet 1  . Vitamin D, Ergocalciferol, (DRISDOL) 50000 units CAPS capsule Take 1 capsule (50,000 Units total) by mouth every 7 (seven) days. 12 capsule 10   No current facility-administered medications for this visit.     Allergies:  No Known Allergies   Review of Systems: General:   No F/C, wt  loss Pulm:   No DIB, SOB, pleuritic chest pain Card:  No CP, palpitations Abd:  No n/v/d or pain Ext:  No inc edema from baseline  Objective:  Blood pressure (!) 146/81, pulse 62, height 5' 8"  (1.727 m), weight 251 lb (113.9 kg). Body mass index is 38.16 kg/m. Gen:   Well NAD, A and O *3 HEENT:    [place]/AT, EOMI,  MMM Lungs:   Normal work of breathing. CTA B/L, no Wh, rhonchi Heart:   RRR, S1, S2 WNL's, no MRG Abd:   No gross distention Exts:    warm, pink,  Brisk capillary refill, warm and well perfused.  Psych:    No HI/SI, judgement and insight good, Euthymic mood. Full Affect.   Recent Results (from the past 2160 hour(s))  POCT UA - Microalbumin     Status: None   Collection Time: 10/02/16  8:29 AM  Result Value Ref Range   Microalbumin Ur, POC 10 mg/L   Creatinine, POC 200 mg/dL   Albumin/Creatinine Ratio, Urine, POC <30   VITAMIN D 25 Hydroxy (Vit-D Deficiency, Fractures)     Status: None   Collection Time: 11/23/16  8:49 AM  Result Value Ref Range   Vit D, 25-Hydroxy 37.5 30.0 - 100.0 ng/mL    Comment: Vitamin D deficiency has been defined by the [place] and an Endocrine Society practice guideline as a level of serum 25-OH vitamin D less than 20 ng/mL (1,2). The Endocrine Society went on to further define vitamin D insufficiency as a level between 21 and 29 ng/mL (2). 1. IOM (Institute of Medicine). 2010. Dietary reference    intakes for calcium and D. [place]: The    [employer]. 2. Holick MF, Binkley , Bischoff-Ferrari HA, et al.    Evaluation, treatment, and prevention of vitamin D    deficiency: an Endocrine Society clinical practice    guideline. JCEM. 2011 Jul; 96(7):1911-30.   Hemoglobin A1c     Status: None   Collection Time: 11/23/16  8:49 AM  Result Value Ref Range   Hgb A1c MFr Bld 5.2 4.8 - 5.6 %    Comment:          Prediabetes: 5.7 - 6.4          Diabetes: >6.4          Glycemic control for adults with diabetes:  <7.0    Est. average glucose Bld gHb Est-mCnc 103 mg/dL  Lipid panel     Status: Abnormal   Collection Time: 11/23/16  8:49 AM  Result Value Ref Range   Cholesterol, Total 261 (H) 100 - 199 mg/dL   Triglycerides 282 (H) 0 - 149 mg/dL   HDL 41 >39 mg/dL   VLDL Cholesterol Cal 56 (H) 5 - 40 mg/dL   LDL Calculated 164 (H) 0 - 99 mg/dL   Chol/HDL Ratio 6.4 (H) 0.0 - 4.4 ratio    Comment:                                   T. Chol/HDL Ratio  Men  Women                               1/2 Avg.Risk  3.4    3.3                                   Avg.Risk  5.0    4.4                                2X Avg.Risk  9.6    7.1                                3X Avg.Risk 23.4   11.0   Comprehensive metabolic panel     Status: Abnormal   Collection Time: 11/23/16  8:49 AM  Result Value Ref Range   Glucose 146 (H) 65 - 99 mg/dL   BUN 11 6 - 24 mg/dL   Creatinine, Ser 0.72 0.57 - 1.00 mg/dL   GFR calc non Af Amer 95 >59 mL/min/1.73   GFR calc Af Amer 110 >59 mL/min/1.73   BUN/Creatinine Ratio 15 9 - 23   Sodium 145 (H) 134 - 144 mmol/L   Potassium 4.4 3.5 - 5.2 mmol/L   Chloride 104 96 - 106 mmol/L   CO2 26 20 - 29 mmol/L   Calcium 9.6 8.7 - 10.2 mg/dL   Total Protein 6.5 6.0 - 8.5 g/dL   Albumin 4.8 3.5 - 5.5 g/dL   Globulin, Total 1.7 1.5 - 4.5 g/dL   Albumin/Globulin Ratio 2.8 (H) 1.2 - 2.2   Bilirubin Total 0.7 0.0 - 1.2 mg/dL   Alkaline Phosphatase 65 39 - 117 IU/L   AST 23 0 - 40 IU/L   ALT 31 0 - 32 IU/L

## 2017-02-08 ENCOUNTER — Encounter: Payer: Self-pay | Admitting: Adult Health

## 2017-02-08 ENCOUNTER — Ambulatory Visit (INDEPENDENT_AMBULATORY_CARE_PROVIDER_SITE_OTHER): Payer: BLUE CROSS/BLUE SHIELD | Admitting: Adult Health

## 2017-02-08 VITALS — BP 162/86 | HR 76 | Temp 98.8°F | Ht 68.0 in | Wt 261.2 lb

## 2017-02-08 DIAGNOSIS — R300 Dysuria: Secondary | ICD-10-CM | POA: Diagnosis not present

## 2017-02-08 DIAGNOSIS — R8290 Unspecified abnormal findings in urine: Secondary | ICD-10-CM | POA: Diagnosis not present

## 2017-02-08 LAB — POCT URINALYSIS DIPSTICK
Bilirubin, UA: NEGATIVE
GLUCOSE UA: NEGATIVE
Ketones, UA: NEGATIVE
NITRITE UA: NEGATIVE
PROTEIN UA: NEGATIVE
SPEC GRAV UA: 1.02 (ref 1.010–1.025)
Urobilinogen, UA: 0.2 E.U./dL
pH, UA: 6.5 (ref 5.0–8.0)

## 2017-02-08 MED ORDER — NITROFURANTOIN MACROCRYSTAL 100 MG PO CAPS: 100.0000 mg | ORAL_CAPSULE | Freq: Two times a day (BID) | ORAL | 0 refills | Status: DC

## 2017-02-08 NOTE — Progress Notes (Signed)
Subjective:    Patient ID: Miranda Delacruz, female    DOB: Apr 30, 1960, 56 y.o.   MRN: [id number]  HPI:  Miranda Delacruz presents with dysuria, frequency, lower abdominal pressure, and small voiding amounts that all started at 0300 this am.  She denies fever/night sweats/N/V/D/, blood in urine/stool.  She has been increasing water, estimates to have consumed at least 80 oz today so far- great! She normally drinks 40-50 oz water/day.   Patient Care Team    Relationship Specialty Notifications Start End  [name], DO PCP - General Family Medicine  09/24/15   Abisogun, Domenica Reamer, MD Referring Physician Endocrinology  10/01/16    Comment: [facility] [place], [place]    Patient Active Problem List   Diagnosis Date Noted  . Dysuria 02/08/2017  . Abnormal urinalysis 02/08/2017  . Counseling on health promotion and disease prevention 07/31/2016  . Cervical cancer screening 07/31/2016  . Screening for breast cancer 07/31/2016  . Prediabetes 10/24/2015  . Low serum HDL 10/24/2015  . Menopause present 09/25/2015  . Migraine headache with aura 02/11/2015  . Special screening for malignant neoplasms, colon   . Benign neoplasm of ascending colon   . Benign neoplasm of transverse colon   . Benign neoplasm of sigmoid colon   . Fatty liver disease, nonalcoholic 67/01/4579  . Elevated liver enzymes- from fatty liver disease. 12/10/2014  . Hyperlipidemia 12/10/2014  . Vitamin D deficiency 12/10/2014  . Obesity, Class III, BMI 40-49.9 (morbid obesity) ([place]) 11/21/2014  . Hypothyroidism 11/21/2014  . HTN (hypertension) 11/21/2014  . Hx of papillary thyroid cancer 11/21/2014     Past Medical History:  Diagnosis Date  . Cancer ([place])   . Hyperlipidemia   . Hypertension   . Thyroid disease      Past Surgical History:  Procedure Laterality Date  . COLONOSCOPY  2000   normal  . COLONOSCOPY WITH PROPOFOL N/A 01/22/2015   Procedure: COLONOSCOPY WITH PROPOFOL;  Surgeon: Lucilla Lame,  MD;  Location: [facility] ENDOSCOPY;  Service: Endoscopy;  Laterality: N/A;  . THYROIDECTOMY    . TONSILLECTOMY    . TUBAL LIGATION       Family History  Problem Relation Age of Onset  . Hypertension Mother   . Hypertension Maternal Grandmother   . Stroke Maternal Grandmother      Social History   Substance and Sexual Activity  Drug Use No     Social History   Substance and Sexual Activity  Alcohol Use Yes  . Alcohol/week: 0.0 oz     Social History   Tobacco Use  Smoking Status Former Smoker  . Last attempt to quit: 02/24/1987  . Years since quitting: 29.9  Smokeless Tobacco Never Used     Outpatient Encounter Medications as of 02/08/2017  Medication Sig Note  . Ascorbic Acid (VITAMIN C) 1000 MG tablet Take 1,000 mg by mouth daily.   . Chromium 1000 MCG TABS Take 1 tablet by mouth daily.   Marland Kitchen Co-Enzyme Q10 200 MG CAPS Take 400 mg by mouth daily.    . Collagen 500 MG CAPS Take 2,500 mg by mouth daily.   Marland Kitchen CRANBERRY ULTRA STRENGTH PO Take 1 tablet by mouth daily.   . Garlic 9983 MG CAPS Take 1 capsule by mouth daily.   Marland Kitchen levothyroxine (SYNTHROID, LEVOTHROID) 150 MCG tablet Take 1 tablet by mouth daily.   . magnesium oxide (MAG-OX) 400 MG tablet Take 2 tablets by mouth daily.   . Menaquinone-7 (VITAMIN K2 PO) Take 6 mg  by mouth daily.   . metoprolol succinate (TOPROL-XL) 100 MG 24 hr tablet Take one tablet every a.m. and one half tablet every Pm ( around 12 hrs later)   . milk thistle 175 MG tablet Take 175 mg by mouth daily.   . Omega-3 Fatty Acids (FISH OIL) 1200 MG CAPS Take 2 capsules by mouth daily.   . TURMERIC PO Take 2 tablets by mouth daily.   . vitamin B-12 (CYANOCOBALAMIN) 1000 MCG tablet Take 1,000 mcg by mouth daily.   . Vitamin D, Ergocalciferol, (DRISDOL) 50000 units CAPS capsule Take 1 capsule (50,000 Units total) by mouth every 7 (seven) days.   Marland Kitchen zinc gluconate 50 MG tablet Take 1 tablet by mouth daily.   . nitrofurantoin (MACRODANTIN) 100 MG capsule  Take 1 capsule (100 mg total) by mouth 2 (two) times daily.   . [DISCONTINUED] chlorthalidone (HYGROTON) 25 MG tablet TAKE ONE TABLET BY MOUTH ONCE DAILY (Patient taking differently: take 1/2 tablet daily) 10/01/2016: Patient taking 1/2 tablet daily   . [DISCONTINUED] Cholecalciferol (VITAMIN D3) 5000 units CAPS Take 1 capsule by mouth daily.   . [DISCONTINUED] levothyroxine (SYNTHROID, LEVOTHROID) 175 MCG tablet Take 1 tablet (175 mcg total) by mouth daily before breakfast. 12/01/2016: Patient states that she is now on 165mg  . [DISCONTINUED] POTASSIUM GLUCONATE PO Take 595 mg by mouth daily.    No facility-administered encounter medications on file as of 02/08/2017.     Allergies: Patient has no known allergies.  Body mass index is 39.72 kg/m.  Blood pressure (!) 160/96, pulse 76, temperature 98.8 F (37.1 C), temperature source Oral, height 5' 8"  (1.727 m), weight 261 lb 3.2 oz (118.5 kg).    Review of Systems  Constitutional: Positive for fatigue. Negative for activity change, appetite change, chills, diaphoresis, fever and unexpected weight change.  Eyes: Negative for visual disturbance.  Respiratory: Negative for cough, chest tightness, shortness of breath, wheezing and stridor.   Cardiovascular: Negative for chest pain, palpitations and leg swelling.  Gastrointestinal: Negative for abdominal distention, abdominal pain, blood in stool, constipation, diarrhea, nausea and vomiting.  Genitourinary: Positive for decreased urine volume, dysuria, frequency and urgency. Negative for difficulty urinating, flank pain, hematuria, pelvic pain and vaginal discharge.  Neurological: Negative for dizziness and headaches.  Hematological: Does not bruise/bleed easily.       Objective:   Physical Exam  Constitutional: She is oriented to person, place, and time. She appears well-developed and well-nourished. No distress.  HENT:  Head: Normocephalic and atraumatic.  Eyes: Conjunctivae are  normal. Pupils are equal, round, and reactive to light.  Cardiovascular: Normal rate, regular rhythm, normal heart sounds and intact distal pulses.  No murmur heard. Pulmonary/Chest: Effort normal and breath sounds normal. No respiratory distress. She has no wheezes. She has no rales. She exhibits no tenderness.  Abdominal: Soft. Bowel sounds are normal. She exhibits no distension and no mass. There is no tenderness. There is no rebound, no guarding and no CVA tenderness.  Neurological: She is alert and oriented to person, place, and time.  Skin: Skin is warm and dry. No rash noted. She is not diaphoretic. No erythema. No pallor.  Psychiatric: She has a normal mood and affect. Her behavior is normal. Judgment and thought content normal.  Nursing note and vitals reviewed.         Assessment & Plan:   1. Dysuria   2. Abnormal urinalysis     Abnormal urinalysis UA + Urine C/S sent off Started on macrodantin 1061m  BID x 7 days Increase water, strive for at least 130 oz/day     FOLLOW-UP:  Return if symptoms worsen or fail to improve.

## 2017-02-08 NOTE — Assessment & Plan Note (Signed)
UA + Urine C/S sent off Started on macrodantin 159m BID x 7 days Increase water, strive for at least 130 oz/day

## 2017-02-08 NOTE — Patient Instructions (Signed)
Urinary Tract Infection, Adult A urinary tract infection (UTI) is an infection of any part of the urinary tract. The urinary tract includes the:  Kidneys.  Ureters.  Bladder.  Urethra.  These organs make, store, and get rid of pee (urine) in the body. Follow these instructions at home:  Take over-the-counter and prescription medicines only as told by your doctor.  If you were prescribed an antibiotic medicine, take it as told by your doctor. Do not stop taking the antibiotic even if you start to feel better.  Avoid the following drinks: ? Alcohol. ? Caffeine. ? Tea. ? Carbonated drinks.  Drink enough fluid to keep your pee clear or pale yellow.  Keep all follow-up visits as told by your doctor. This is important.  Make sure to: ? Empty your bladder often and completely. Do not to hold pee for long periods of time. ? Empty your bladder before and after sex. ? Wipe from front to back after a bowel movement if you are female. Use each tissue one time when you wipe. Contact a doctor if:  You have back pain.  You have a fever.  You feel sick to your stomach (nauseous).  You throw up (vomit).  Your symptoms do not get better after 3 days.  Your symptoms go away and then come back. Get help right away if:  You have very bad back pain.  You have very bad lower belly (abdominal) pain.  You are throwing up and cannot keep down any medicines or water. This information is not intended to replace advice given to you by your health care provider. Make sure you discuss any questions you have with your health care provider. Document Released: 07/29/2007 Document Revised: 07/18/2015 Document Reviewed: 12/31/2014 Elsevier Interactive Patient Education  2018 [employer].  Increase water intake, strive for at least 130 ounces/day.   Follow Heart Healthy diet Please start macrodantin- 166m twice daily for 7 days.  We will call you when the urine culture/sensitivty results are  available. FEEL BETTER!

## 2017-02-11 LAB — CULTURE, URINE COMPREHENSIVE

## 2017-03-01 ENCOUNTER — Encounter: Payer: Self-pay | Admitting: Family Medicine

## 2017-03-02 ENCOUNTER — Other Ambulatory Visit: Payer: Self-pay

## 2017-03-02 DIAGNOSIS — B379 Candidiasis, unspecified: Secondary | ICD-10-CM

## 2017-03-02 MED ORDER — FLUCONAZOLE 150 MG PO TABS: 150.0000 mg | ORAL_TABLET | Freq: Once | ORAL | 0 refills | Status: AC

## 2017-03-02 NOTE — Telephone Encounter (Signed)
Patient contacted office requesting medication for yeast infection caused by recent use of antibiotics for UTI. Spoke to Dr. Raliegh Scarlet and instruction to send in Wilder for the patient. Patient notified. MPulliam, CMA/RT(R)

## 2017-04-02 ENCOUNTER — Ambulatory Visit: Payer: BLUE CROSS/BLUE SHIELD | Admitting: Family Medicine

## 2017-04-09 ENCOUNTER — Encounter: Payer: Self-pay | Admitting: Endocrinology

## 2017-04-09 ENCOUNTER — Encounter: Payer: Self-pay | Admitting: Family Medicine

## 2017-06-02 ENCOUNTER — Ambulatory Visit: Payer: BLUE CROSS/BLUE SHIELD | Admitting: Adult Health

## 2017-06-08 IMAGING — US US ABDOMEN LIMITED
1 series · 14 of 25 positions shown · non-contrast
Comparison: None in PACs

CLINICAL DATA: Elevated liver enzymes

EXAM:
US ABDOMEN LIMITED - RIGHT UPPER QUADRANT

[Series 1: us abdomen limited · 0.29mm/px · 14 of 60 slices shown]
[im 1/60]
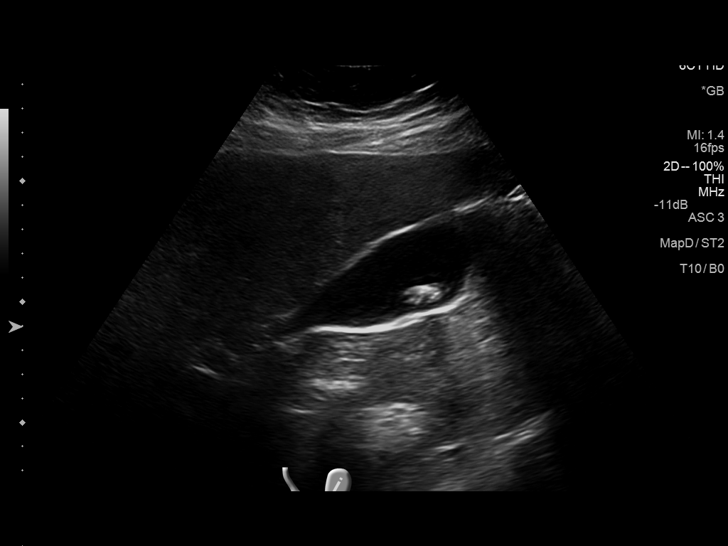
[im 5/60]
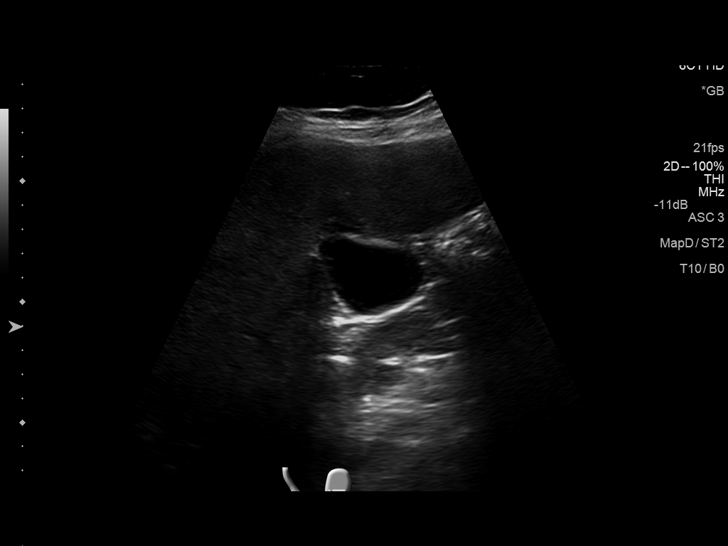
[im 10/60]
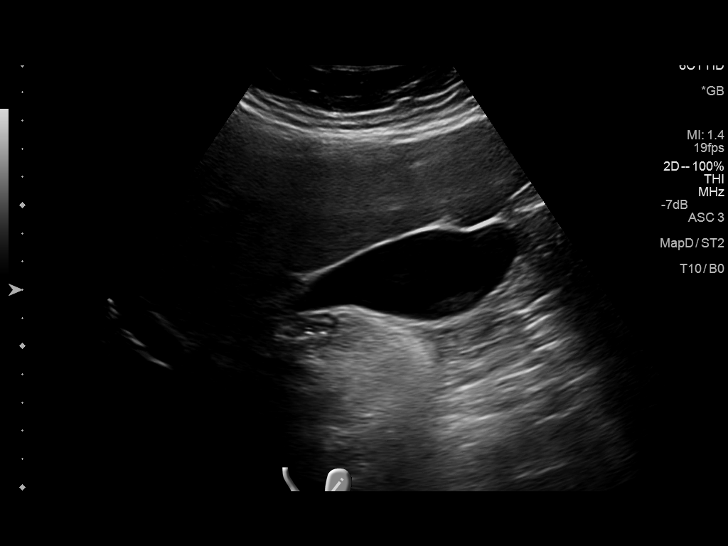
[im 15/60]
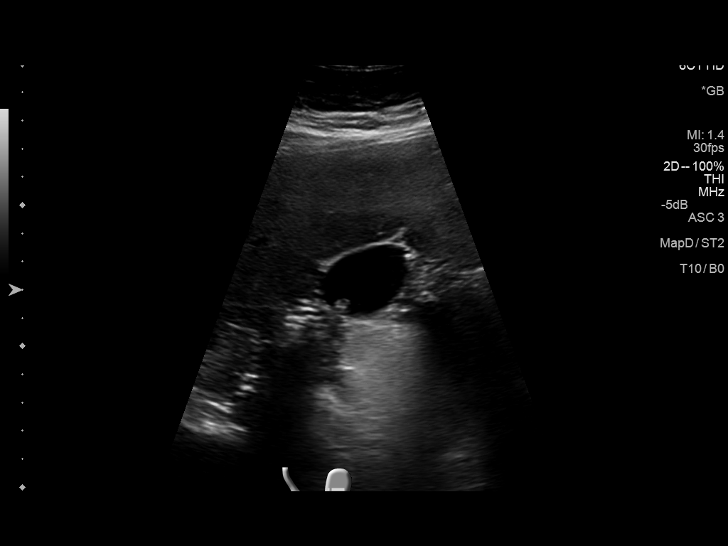
[im 20/60]
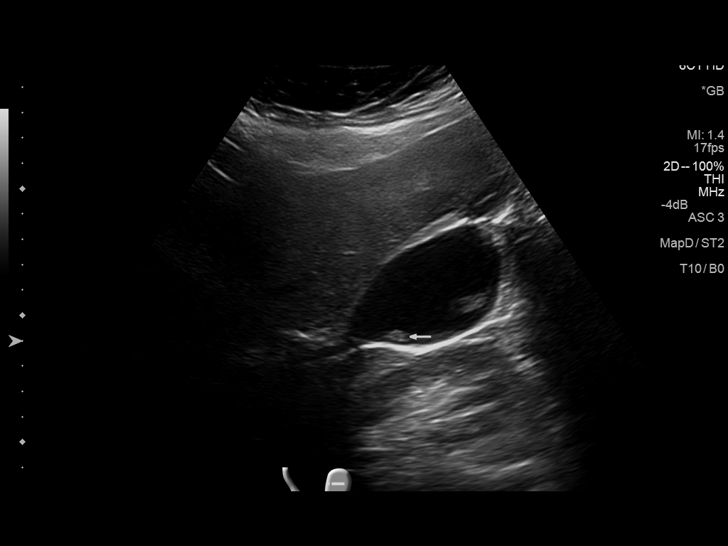
[im 23/60]
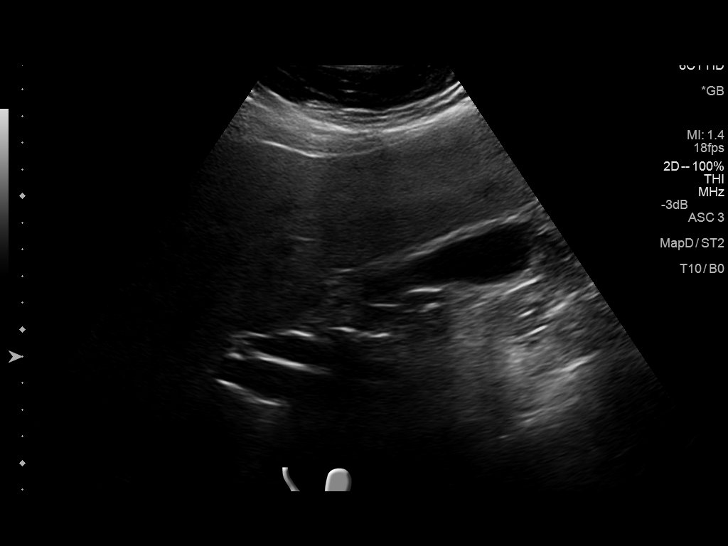
[im 28/60]
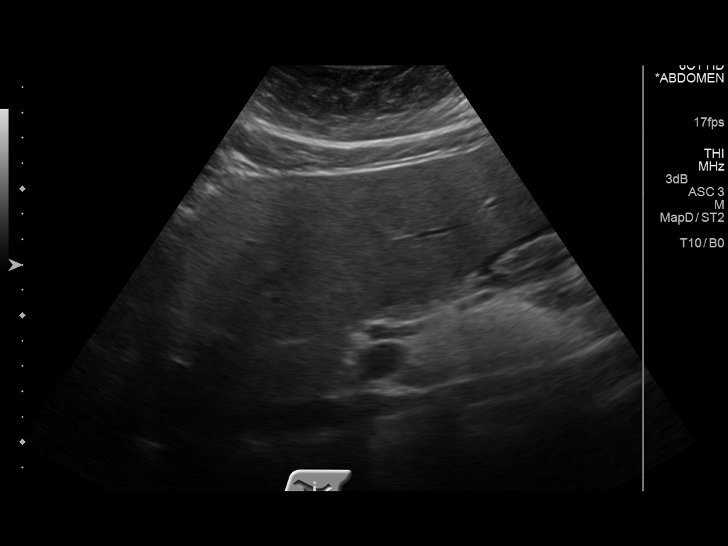
[im 32/60]
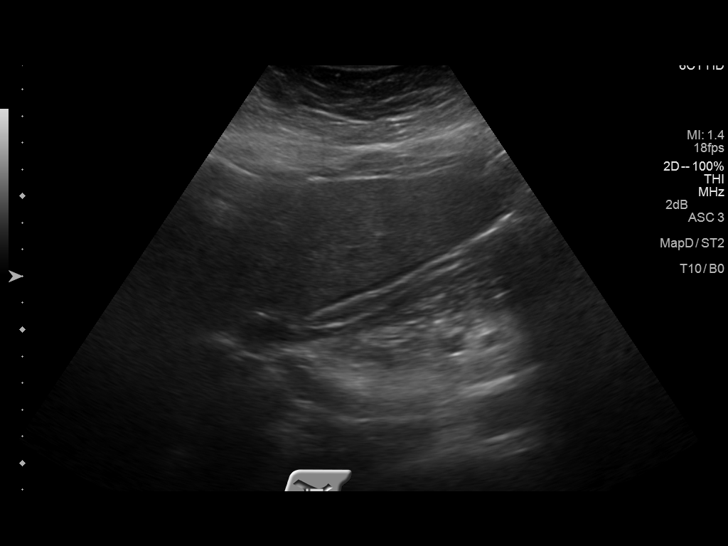
[im 37/60]
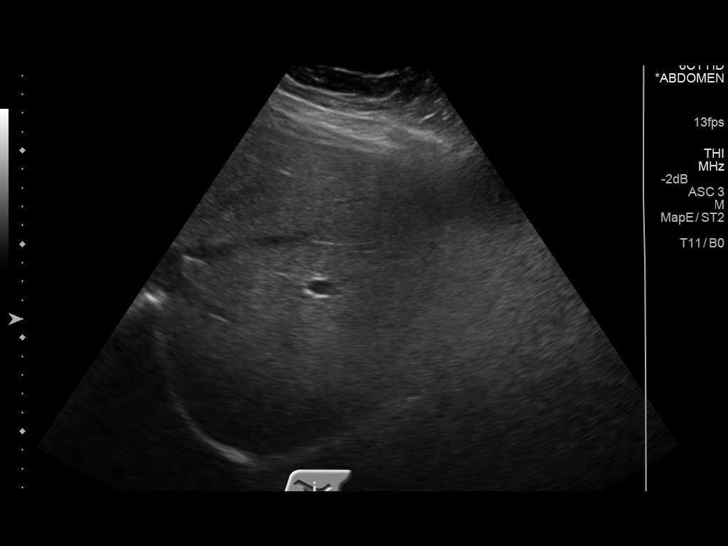
[im 40/60]
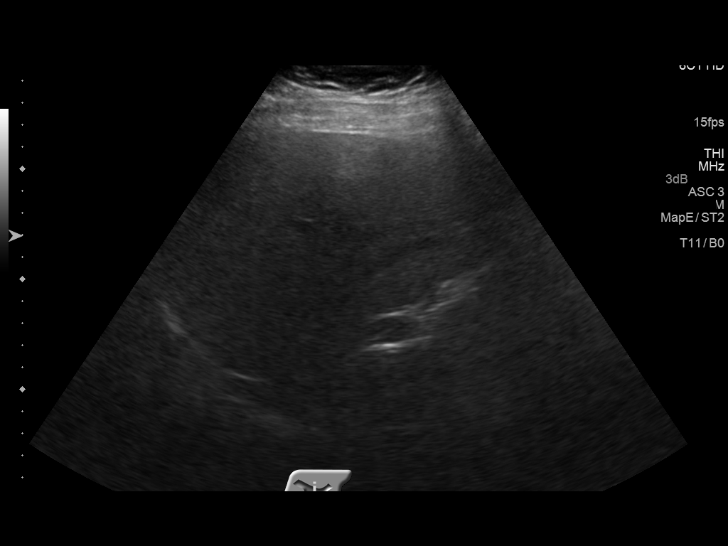
[im 45/60]
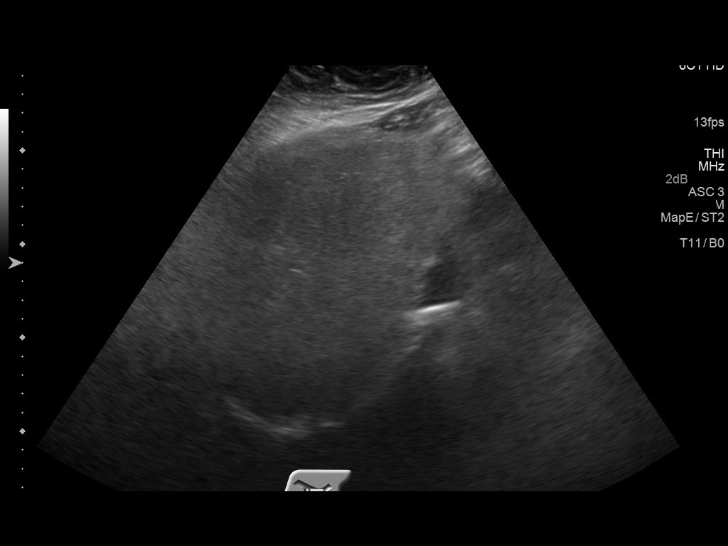
[im 50/60]
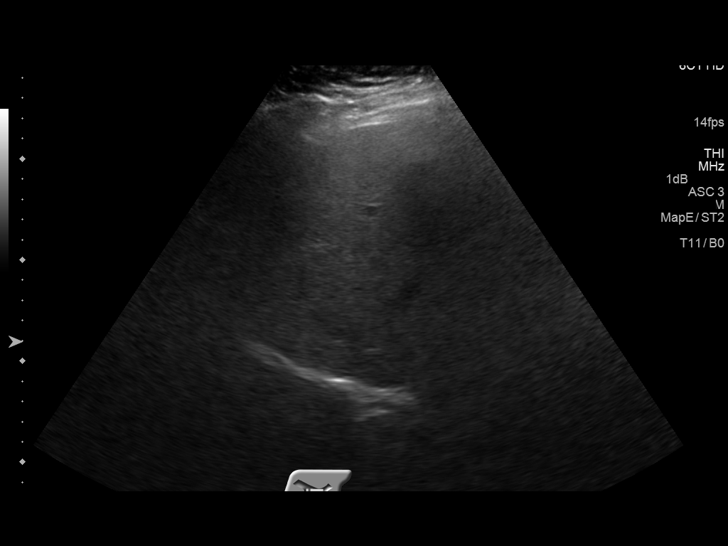
[im 55/60]
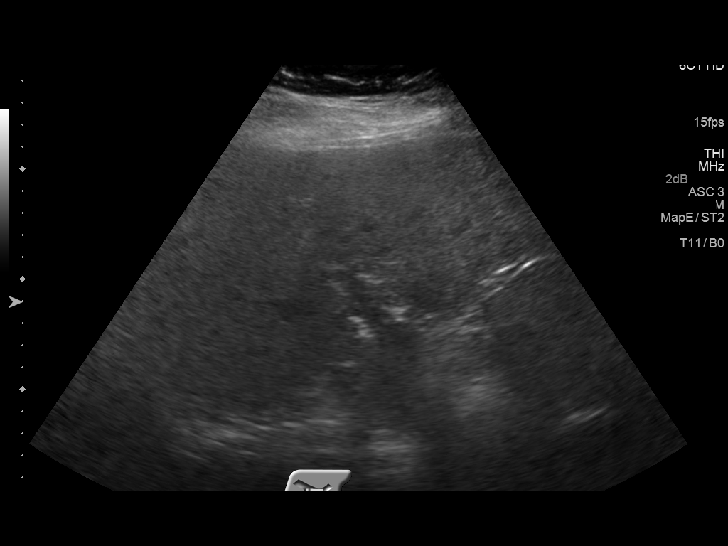
[im 60/60]
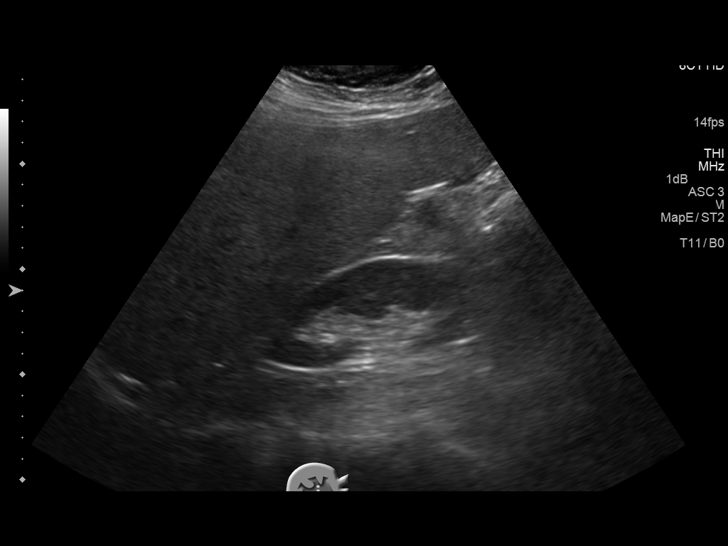

[14 of 25 positions shown; findings below may reference images not displayed]

FINDINGS: Gallbladder:

The gallbladder is adequately distended. There is an echogenic
mobile, non-shadowing stone measuring 1.5 cm. There is a 6 mm non
mobile echogenic focus that likely reflects a polyp or adherent
stone. There is no gallbladder wall thickening, pericholecystic
fluid, or positive sonographic Murphy's sign.

Common bile duct:

Diameter: 9.6 cm proximally tapering to 7 mm at the level of the
pancreatic head. No intra ductal stones are observed.

Liver:

The hepatic echotexture is heterogeneous and mildly increased. There
is no focal mass or ductal dilation. The surface contour is normal.
IMPRESSION: 1. Gallstones without sonographic evidence of acute cholecystitis.
2. Mild dilation of the common bile duct without definite evidence
of intraluminal stones. Limited visualization of the pancreatic head
revealed no mass.
3. Increased hepatic echotexture consistent with fatty infiltrative
change.

## 2017-06-16 ENCOUNTER — Ambulatory Visit: Payer: 59 | Admitting: Family Medicine

## 2017-07-05 ENCOUNTER — Ambulatory Visit: Payer: 59 | Admitting: Family Medicine

## 2017-11-13 ENCOUNTER — Other Ambulatory Visit: Payer: Self-pay | Admitting: Family Medicine

## 2017-11-13 DIAGNOSIS — I10 Essential (primary) hypertension: Secondary | ICD-10-CM

## 2017-12-06 ENCOUNTER — Other Ambulatory Visit: Payer: Self-pay

## 2017-12-06 ENCOUNTER — Other Ambulatory Visit: Payer: Self-pay | Admitting: Family Medicine

## 2017-12-06 DIAGNOSIS — I10 Essential (primary) hypertension: Secondary | ICD-10-CM

## 2017-12-06 MED ORDER — METOPROLOL SUCCINATE ER 100 MG PO TB24: ORAL_TABLET | ORAL | 0 refills | Status: DC

## 2017-12-08 ENCOUNTER — Other Ambulatory Visit: Payer: Self-pay | Admitting: Family Medicine

## 2017-12-08 DIAGNOSIS — I10 Essential (primary) hypertension: Secondary | ICD-10-CM

## 2017-12-10 ENCOUNTER — Telehealth: Payer: Self-pay | Admitting: Family Medicine

## 2017-12-10 NOTE — Telephone Encounter (Signed)
Patient was called by pharmacy that she had a prescription for her metoprolol ready. She went to pick it up and was advised that is was only for 45 pill. Patient states that insurance picks up the cost better for a 90 day supply and is wanting a new order to be sent to [facility] on [address] for this if at all possible. Please advise if this is something we can do for the patient

## 2017-12-10 NOTE — Telephone Encounter (Signed)
Called unable to reach the patient. MPulliam, CMA/RT(R)

## 2017-12-13 NOTE — Telephone Encounter (Signed)
Called, unable to reach the patient. MPulliam, CMA/RT(R)

## 2017-12-16 ENCOUNTER — Ambulatory Visit (INDEPENDENT_AMBULATORY_CARE_PROVIDER_SITE_OTHER): Payer: BLUE CROSS/BLUE SHIELD | Admitting: Family Medicine

## 2017-12-16 ENCOUNTER — Encounter: Payer: Self-pay | Admitting: Family Medicine

## 2017-12-16 VITALS — BP 140/84 | HR 68 | Ht 68.0 in | Wt 271.0 lb

## 2017-12-16 DIAGNOSIS — E1159 Type 2 diabetes mellitus with other circulatory complications: Secondary | ICD-10-CM | POA: Diagnosis not present

## 2017-12-16 DIAGNOSIS — E1129 Type 2 diabetes mellitus with other diabetic kidney complication: Secondary | ICD-10-CM

## 2017-12-16 DIAGNOSIS — R809 Proteinuria, unspecified: Secondary | ICD-10-CM | POA: Insufficient documentation

## 2017-12-16 DIAGNOSIS — E1169 Type 2 diabetes mellitus with other specified complication: Secondary | ICD-10-CM | POA: Diagnosis not present

## 2017-12-16 DIAGNOSIS — E890 Postprocedural hypothyroidism: Secondary | ICD-10-CM

## 2017-12-16 DIAGNOSIS — Z23 Encounter for immunization: Secondary | ICD-10-CM

## 2017-12-16 DIAGNOSIS — I152 Hypertension secondary to endocrine disorders: Secondary | ICD-10-CM

## 2017-12-16 DIAGNOSIS — Z1239 Encounter for other screening for malignant neoplasm of breast: Secondary | ICD-10-CM

## 2017-12-16 DIAGNOSIS — E119 Type 2 diabetes mellitus without complications: Secondary | ICD-10-CM

## 2017-12-16 DIAGNOSIS — R748 Abnormal levels of other serum enzymes: Secondary | ICD-10-CM

## 2017-12-16 DIAGNOSIS — E559 Vitamin D deficiency, unspecified: Secondary | ICD-10-CM

## 2017-12-16 DIAGNOSIS — I10 Essential (primary) hypertension: Secondary | ICD-10-CM

## 2017-12-16 DIAGNOSIS — E782 Mixed hyperlipidemia: Secondary | ICD-10-CM

## 2017-12-16 LAB — POCT UA - MICROALBUMIN
CREATININE, POC: 200 mg/dL
MICROALBUMIN (UR) POC: 80 mg/L

## 2017-12-16 LAB — POCT GLYCOSYLATED HEMOGLOBIN (HGB A1C): HEMOGLOBIN A1C: 8.1 % — AB (ref 4.0–5.6)

## 2017-12-16 MED ORDER — METFORMIN HCL 500 MG PO TABS: 500.0000 mg | ORAL_TABLET | Freq: Two times a day (BID) | ORAL | 1 refills | Status: DC

## 2017-12-16 MED ORDER — LOSARTAN POTASSIUM 50 MG PO TABS: 50.0000 mg | ORAL_TABLET | Freq: Every day | ORAL | 1 refills | Status: DC

## 2017-12-16 MED ORDER — METOPROLOL SUCCINATE ER 100 MG PO TB24: ORAL_TABLET | ORAL | 1 refills | Status: DC

## 2017-12-16 MED ORDER — ATORVASTATIN CALCIUM 20 MG PO TABS: 20.0000 mg | ORAL_TABLET | Freq: Every day | ORAL | 1 refills | Status: DC

## 2017-12-16 NOTE — Progress Notes (Signed)
Impression and Recommendations:    1. Type 2 diabetes mellitus without complication, without long-term current use of insulin ([place])   2. Hypertension associated with diabetes ([place])   3. Mixed diabetic hyperlipidemia associated with type 2 diabetes mellitus ([place])   4. Microalbuminuria due to type 2 diabetes mellitus ([place])   5. Screening for breast cancer   6. Postoperative hypothyroidism   7. Elevated liver enzymes- from fatty liver disease.   8. Vitamin D deficiency   9. Low serum HDL   10. Flu vaccine need   11. Essential hypertension    - Need for flu vaccine, updated CPE screenings in future. - Referral to mammography placed today.  1. Diabetes - History of Prediabetes - Patient's A1c is 8.1, up from last check a year ago at 5.2. - Confirms polyuria/polydipsia.  - Patient with uncontrolled diabetes. - Recommended beginning treatment on metformin today.  See med list below. - Reviewed S-E of metformin with patient today.  Discussed methods to manage and prevent S-E.  Educated patient about weaning up to 500 mg dose of metformin, starting with half tablet twice daily.  - Patient is established with Dr. Honor Junes of endocrinology for thyroid management (post-operative hypothyroidism).  Recommended that patient discuss possibility of obtaining assistance through endocrinology for management of diabetes.   --->  She indicated today she would like that physician / specialist to control both of her endocrine problems.  She will let us know in the future how she would like to proceed.  - Will continue to monitor closely.  - Counseled and educated patient extensively today about diabetes diagnosis.  Emphasized critical importance of controlling A1c.  Reviewed goal A1c with patient in office today.  - Counseled patient on pathophysiology of disease and discussed various treatment options, which often includes dietary and lifestyle modifications as first line.  Importance of low  carb/ketogenic diet discussed with patient in addition to regular exercise.   - Use your glucometer at home to begin checking FBS and 2 hours after the biggest meal of your day.  Keep log and bring in next OV for my review.   Also, if you ever feel poorly, please check your blood pressure and blood sugar, as one or the other could be the cause of your symptoms.  - Being a diabetic, you need yearly eye and foot exams. Make appt.for diabetic eye exam   2. Hypertension Associated with Diabetes  - BP remains sub-optimally controlled at this time.  - Begin Olmesartan today in addition to treatment plan.  See med list below.  - Reviewed goal as less than 130/80 on a regular basis.  - Lifestyle changes such as dash diet and engaging in a regular exercise program discussed with patient.  Educational handouts provided.  - Ambulatory BP monitoring encouraged. Keep log and bring in next OV.  Continue current medication(s).   Also, risks and benefits of medications discussed with patient, including alternative treatments.   Encouraged patient to read drug information handouts to further educate self about the medicine prior to starting it.   Contact us prior with any Q's/ concerns.   3. Hyperlipidemia Associated with Diabetes  - Start on Lipitor, half-tab of 20 mg tablet, wean up to 80 mg.  - Patient agrees to begin treatment on statins at this time.  - Last Lipid Panel drawn on 11/23/2016. - Cholesterol elevated at that time. Triglycerides = 282. HDL = 41. VLDL = 56. LDL = 164.  - Reviewed that  without adequate hydration and exercise, S-E will be exacerbated.  Discussed that S-E are minimized when patients drink plenty of water and remain physically active.  - Dietary changes such as low saturated & trans fat and low carb/ ketogenic diets discussed with patient.  Encouraged regular exercise and weight loss when appropriate.   - Educational handouts provided at patient's desire.   4.  BMI Counseling - Recommended that the patient begin a free fitness program.  Explained to patient what BMI refers to, and what it means medically.    Told patient to think about it as a "medical risk stratification measurement" and how increasing BMI is associated with increasing risk/ or worsening state of various diseases such as hypertension, hyperlipidemia, diabetes, premature OA, depression etc.  American Heart Association guidelines for healthy diet, basically Mediterranean diet, and exercise guidelines of 30 minutes 5 days per week or more discussed in detail.  Health counseling performed.  All questions answered.   5. Lifestyle & Preventative Health Maintenance - Advised patient to continue working toward exercising to improve overall mental, physical, and emotional health.   - Encouraged patient to engage in daily physical activity, especially a formal exercise routine.  Recommended that the patient eventually strive for at least 150 minutes of moderate cardiovascular activity per week according to guidelines established by the [facility].   - Healthy dietary habits encouraged, including low-carb, and high amounts of lean protein in diet.   - Patient should also consume adequate amounts of water - half of body weight in oz of water per day.   6. Follow Up - Prescriptions provided and refilled today PRN.  - Critical need for updated baseline lab work.  Labs drawn today.  - Re-check lab work (CMP) in 6 weeks to check progress on new medications.  - Otherwise, continue to return for CPE and chronic follow-up ever 3 months as scheduled.   - Patient knows to call in sooner if desired to address acute concerns.  Pt was in the office today for 32.5+ minutes, with over 50% time spent in face to face counseling of patients various medical conditions, treatment plans of those medical conditions including medicine management and lifestyle modification, strategies to improve health and well  being; and in coordination of care. SEE ABOVE TREATMENT PLAN FOR DETAILS     Education and routine counseling performed. Handouts provided.   Orders Placed This Encounter  Procedures  . MM Digital Screening  . Flu Vaccine QUAD 6+ mos PF IM (Fluarix Quad PF)  . CBC with Differential/Platelet  . Comprehensive metabolic panel  . T4, free  . Lipid panel  . TSH  . VITAMIN D 25 Hydroxy (Vit-D Deficiency, Fractures)  . POCT glycosylated hemoglobin (Hb A1C)  . POCT UA - Microalbumin  . HM Diabetes Foot Exam    Medications Discontinued During This Encounter  Medication Reason  . nitrofurantoin (MACRODANTIN) 100 MG capsule Completed Course  . metoprolol succinate (TOPROL-XL) 100 MG 24 hr tablet Reorder      Meds ordered this encounter  Medications  . metFORMIN (GLUCOPHAGE) 500 MG tablet    Sig: Take 1 tablet (500 mg total) by mouth 2 (two) times daily with a meal.    Dispense:  180 tablet    Refill:  1  . atorvastatin (LIPITOR) 20 MG tablet    Sig: Take 1 tablet (20 mg total) by mouth daily.    Dispense:  90 tablet    Refill:  1  . losartan (COZAAR) 50 MG  tablet    Sig: Take 1 tablet (50 mg total) by mouth daily.    Dispense:  90 tablet    Refill:  1  . metoprolol succinate (TOPROL-XL) 100 MG 24 hr tablet    Sig: TAKE 1 TABLET BY MOUTH IN THE MORNING AND 1/2 (ONE-HALF) IN THE EVENING (AROUND  12  HOURS  LATER)    Dispense:  135 tablet    Refill:  1    Please consider 90 day supplies to promote better adherence    The patient was counseled, risk factors were discussed, anticipatory guidance given.  Gross side effects, risk and benefits, and alternatives of medications discussed with patient.  Patient is aware that all medications have potential side effects and we are unable to predict every side effect or drug-drug interaction that may occur.  Expresses verbal understanding and consents to current therapy plan and treatment regimen.   Return for Follow-up 6 weeks for CMP  lab only, and 3 months for DM, BP, HLD.   Please see AVS handed out to patient at the end of our visit for further patient instructions/ counseling done pertaining to today's office visit.    Note:  This document was prepared using Dragon voice recognition software and may include unintentional dictation errors.  This document serves as a record of services personally performed by [name], DO. It was created on her behalf by Toni Amend, a trained medical scribe. The creation of this record is based on the scribe's personal observations and the provider's statements to them.   I have reviewed the above medical documentation for accuracy and completeness and I concur.  [name] 12/21/17 12:26 PM    Subjective:    Chief Complaint  Patient presents with  . Follow-up    Miranda Delacruz is a 57 y.o. female who [facility] today for Diabetes Management.    Brother died of "widow maker heart attack" at age 21.  Need for UTD Pap Smear and Mammogram Patient states that her last pap smear and last mammogram were both in 2016.  Patient denies history of abnormal paps, states she has no new sexual partners, and denies family history of female cancers.  Reasoning for Not Returning to Clinic Has been trying to start a new business, "it's just a lot of work."  Notes it's going well.  States that she is surprised at how easy it is to start a business in [place].  States they had a build up of medical bills after moving to [place] which was overwhelming.  Describes her insurance as "very shitty."  History of Weight Loss Patient had lost 25 lbs and got down to the 240's  DM HPI: -  She has not been working on diet and exercise.  Patient was prediabetic in the past.  States she's been eating really bad, eating "lots of carbs."  States that her husband brings home junk food, which she tries to throw out.  Pt is currently maintained  on the following medications for diabetes: Medications started today.  Patient does have a glucometer at home.  Confirms polyuria/polydipsia.  Denies hypo/ hyperglycemia symptoms - She denies new onset of: chest pain, exercise intolerance, shortness of breath, dizziness, visual changes, headache, lower extremity swelling or claudication.   Last diabetic eye exam was No results found for: HMDIABEYEEXA  Foot exam- UTD  Last A1C in the office was:  Lab Results  Component Value Date   HGBA1C  8.1 (A) 12/16/2017   HGBA1C 5.2 11/23/2016   HGBA1C 6 07/31/2016    Lab Results  Component Value Date   MICROALBUR 80 12/16/2017   [facility] Comment 12/17/2017   CREATININE 0.78 12/17/2017     Last 3 blood pressure readings in our office are as follows: BP Readings from Last 3 Encounters:  12/16/17 140/84  02/08/17 (!) 162/86  12/01/16 (!) 146/81    BMI Readings from Last 3 Encounters:  12/16/17 41.21 kg/m  02/08/17 39.72 kg/m  12/01/16 38.16 kg/m     No problems updated.    Patient Care Team    Relationship Specialty Notifications Start End  [name], DO PCP - General Family Medicine  09/24/15   Abisogun, Domenica Reamer, MD Referring Physician Endocrinology  10/01/16    Comment: [facility] [place], [place]     Patient Active Problem List   Diagnosis Date Noted  . Prediabetes 10/24/2015    Priority: High  . Migraine headache with aura 02/11/2015    Priority: High  . Hyperlipidemia 12/10/2014    Priority: High  . Obesity, Class III, BMI 40-49.9 (morbid obesity) ([place]) 11/21/2014    Priority: High  . HTN (hypertension) 11/21/2014    Priority: High  . Hypothyroidism 11/21/2014    Priority: Medium  . Hx of papillary thyroid cancer 11/21/2014    Priority: Medium  . Counseling on health promotion and disease prevention 07/31/2016    Priority: Low  . Fatty liver disease, nonalcoholic 14/78/2956    Priority: Low  . Elevated liver enzymes- from fatty liver  disease. 12/10/2014    Priority: Low  . Vitamin D deficiency 12/10/2014    Priority: Low  . Type 2 diabetes mellitus without complication, without long-term current use of insulin ([place]) 12/16/2017  . Hypertension associated with diabetes ([place]) 12/16/2017  . Mixed diabetic hyperlipidemia associated with type 2 diabetes mellitus ([place]) 12/16/2017  . Microalbuminuria due to type 2 diabetes mellitus ([place]) 12/16/2017  . Dysuria 02/08/2017  . Abnormal urinalysis 02/08/2017  . Cervical cancer screening 07/31/2016  . Screening for breast cancer 07/31/2016  . Low serum HDL 10/24/2015  . Menopause present 09/25/2015  . Special screening for malignant neoplasms, colon   . Benign neoplasm of ascending colon   . Benign neoplasm of transverse colon   . Benign neoplasm of sigmoid colon      Past Medical History:  Diagnosis Date  . Cancer ([place])   . Hyperlipidemia   . Hypertension   . Thyroid disease      Past Surgical History:  Procedure Laterality Date  . COLONOSCOPY  2000   normal  . COLONOSCOPY WITH PROPOFOL N/A 01/22/2015   Procedure: COLONOSCOPY WITH PROPOFOL;  Surgeon: Lucilla Lame, MD;  Location: [facility] ENDOSCOPY;  Service: Endoscopy;  Laterality: N/A;  . THYROIDECTOMY    . TONSILLECTOMY    . TUBAL LIGATION       Family History  Problem Relation Age of Onset  . Hypertension Mother   . Hypertension Maternal Grandmother   . Stroke Maternal Grandmother      Social History   Substance and Sexual Activity  Drug Use No  ,  Social History   Substance and Sexual Activity  Alcohol Use Yes  . Alcohol/week: 0.0 standard drinks  ,  Social History   Tobacco Use  Smoking Status Former Smoker  . Last attempt to quit: 02/24/1987  . Years since quitting: 30.8  Smokeless Tobacco Never Used  ,    Current Outpatient Medications on File Prior  to Visit  Medication Sig Dispense Refill  . Ascorbic Acid (VITAMIN C) 1000 MG tablet Take 1,000 mg by mouth daily.    . Chromium 1000 MCG  TABS Take 1 tablet by mouth daily.    Marland Kitchen Co-Enzyme Q10 200 MG CAPS Take 400 mg by mouth daily.     . Collagen 500 MG CAPS Take 2,500 mg by mouth daily.    Marland Kitchen CRANBERRY ULTRA STRENGTH PO Take 1 tablet by mouth daily.    . Garlic 7125 MG CAPS Take 1 capsule by mouth daily.    Marland Kitchen levothyroxine (SYNTHROID, LEVOTHROID) 150 MCG tablet Take 1 tablet by mouth daily.    . magnesium oxide (MAG-OX) 400 MG tablet Take 2 tablets by mouth daily.    . Menaquinone-7 (VITAMIN K2 PO) Take 6 mg by mouth daily.    . milk thistle 175 MG tablet Take 175 mg by mouth daily.    . Omega-3 Fatty Acids (FISH OIL) 1200 MG CAPS Take 2 capsules by mouth daily.    . TURMERIC PO Take 2 tablets by mouth daily.    . vitamin B-12 (CYANOCOBALAMIN) 1000 MCG tablet Take 1,000 mcg by mouth daily.    . Vitamin D, Ergocalciferol, (DRISDOL) 50000 units CAPS capsule Take 1 capsule (50,000 Units total) by mouth every 7 (seven) days. 12 capsule 10  . zinc gluconate 50 MG tablet Take 1 tablet by mouth daily.     No current facility-administered medications on file prior to visit.      No Known Allergies   Review of Systems:   General:  Denies fever, chills Optho/Auditory:   Denies visual changes, blurred vision Respiratory:   Denies SOB, cough, wheeze, DIB  Cardiovascular:   Denies chest pain, palpitations, painful respirations Gastrointestinal:   Denies nausea, vomiting, diarrhea.  Endocrine:     Denies new hot or cold intolerance Musculoskeletal:  Denies joint swelling, gait issues, or new unexplained myalgias/ arthralgias Skin:  Denies rash, suspicious lesions  Neurological:    Denies dizziness, unexplained weakness, numbness  Psychiatric/Behavioral:   Denies mood changes    Objective:     Blood pressure 140/84, pulse 68, height 5' 8"  (1.727 m), weight 271 lb (122.9 kg), SpO2 98 %.  Body mass index is 41.21 kg/m.  General: Well Developed, well nourished, and in no acute distress.  HEENT: Normocephalic, atraumatic,  pupils equal round reactive to light, neck supple, No carotid bruits, no JVD Skin: Warm and dry, cap RF less 2 sec Cardiac: Regular rate and rhythm, S1, S2 WNL's, no murmurs rubs or gallops Respiratory: ECTA B/L, Not using accessory muscles, speaking in full sentences. NeuroM-Sk: Ambulates w/o assistance, moves ext * 4 w/o difficulty, sensation grossly intact.  Ext: scant edema b/l lower ext Psych: No HI/SI, judgement and insight good, Euthymic mood. Full Affect.

## 2017-12-16 NOTE — Patient Instructions (Addendum)
-Information bolded below is specific to patient office visit today:   Miranda Delacruz please start off with a half a tablet of the metformin twice daily this will be 250 mg twice daily.  Again, very important you eat healthy, smaller amounts and stay away from sugars and carbs.  If you do not, you will feel sick and nauseous.  Eventually after 8 days please go up to 1000 mg twice daily and continue on this until I see you in the future  - please make sure you are checking her blood sugars fasting and 2 hours after your largest meal and bring in these numbers next office visit. -Also please make sure you are checking your blood pressures Monday morning, Wednesday afternoon, Friday evening and then as needed in between and bring that in as well. -After you have taken the metformin please add on the losartan blood pressure medicine.  Please start with 1/2 tablet daily every morning and monitor your blood pressure.  If it is not less than 130/80 on a regular basis please go to 1 full tablet after 6 days.  -Additionally, we are starting on a cholesterol medicine-atorvastatin-that needs to be taken before bedtime every evening.  Please start with 1/2 tablet every evening after you have taken the other medicines and tolerate them well.  We do not want you to start a whole bunch of new medicines at once because if you do not tolerate them we will not know which one it is causing the problem.   Thus, please start with half a tablet of the cholesterol medicine for 6 days then go up to 1 full tablet before bedtime.  --> Again, extremely important you drink half of your weight in ounces water per day and exercise daily to a goal of 30 to 45 minutes.  Please start off slowly and slowly over weeks ramp this up.      Diabetes Mellitus and Standards of Medical Care  Managing diabetes (diabetes mellitus) can be complicated. Your diabetes treatment may be managed by a team of health care providers, including:  A diet and  nutrition specialist (registered dietitian).  A nurse.  A certified diabetes educator (CDE).  A diabetes specialist (endocrinologist).  An eye doctor.  A primary care provider.  A dentist.  Your health care providers follow a schedule in order to help you get the best quality of care. The following schedule is a general guideline for your diabetes management plan. Your health care providers may also give you more specific instructions.  HbA1c (hemoglobin A1c) test This test provides information about blood sugar (glucose) control over the previous 2-3 months. It is used to check whether your diabetes management plan needs to be adjusted.  If you are meeting your treatment goals, this test is done at least 2 times a year.  If you are not meeting treatment goals or if your treatment goals have changed, this test is done 4 times a year.  Blood pressure test  This test is done at every routine medical visit. For most people, the goal is less than 130/80. Ask your health care provider what your goal blood pressure should be.  Dental and eye exams  Visit your dentist two times a year.  If you have type 1 diabetes, get an eye exam 3-5 years after you are diagnosed, and then once a year after your first exam. ? If you were diagnosed with type 1 diabetes as a child, get an eye exam when you are  age 42 or older and have had diabetes for 3-5 years. After the first exam, you should get an eye exam once a year.  If you have type 2 diabetes, have an eye exam as soon as you are diagnosed, and then once a year after your first exam.  Foot care exam  Visual foot exams are done at every routine medical visit. The exams check for cuts, bruises, redness, blisters, sores, or other problems with the feet.  A complete foot exam is done by your health care provider once a year. This exam includes an inspection of the structure and skin of your feet, and a check of the pulses and sensation in your  feet. ? Type 1 diabetes: Get your first exam 3-5 years after diagnosis. ? Type 2 diabetes: Get your first exam as soon as you are diagnosed.  Check your feet every day for cuts, bruises, redness, blisters, or sores. If you have any of these or other problems that are not healing, contact your health care provider.  Kidney function test (urine microalbumin)  This test is done once a year. ? Type 1 diabetes: Get your first test 5 years after diagnosis. ? Type 2 diabetes: Get your first test as soon as you are diagnosed._  If you have chronic kidney disease (CKD), get a serum creatinine and estimated glomerular filtration rate (eGFR) test once a year.  Lipid profile (cholesterol, HDL, LDL, triglycerides)  This test should be done when you are diagnosed with diabetes, and every 5 years after the first test. If you are on medicines to lower your cholesterol, you may need to get this test done every year. ? The goal for LDL is less than 100 mg/dL (5.5 mmol/L). If you are at high risk, the goal is less than 70 mg/dL (3.9 mmol/L). ? The goal for HDL is 40 mg/dL (2.2 mmol/L) for men and 50 mg/dL(2.8 mmol/L) for women. An HDL cholesterol of 60 mg/dL (3.3 mmol/L) or higher gives some protection against heart disease. ? The goal for triglycerides is less than 150 mg/dL (8.3 mmol/L).  Immunizations  The yearly flu (influenza) vaccine is recommended for everyone 6 months or older who has diabetes.  The pneumonia (pneumococcal) vaccine is recommended for everyone 2 years or older who has diabetes. If you are 65 or older, you may get the pneumonia vaccine as a series of two separate shots.  The hepatitis B vaccine is recommended for adults shortly after they have been diagnosed with diabetes.  The Tdap (tetanus, diphtheria, and pertussis) vaccine should be given: ? According to normal childhood vaccination schedules, for children. ? Every 10 years, for adults who have diabetes.  The shingles vaccine  is recommended for people who have had chicken pox and are 50 years or older.  Mental and emotional health  Screening for symptoms of eating disorders, anxiety, and depression is recommended at the time of diagnosis and afterward as needed. If your screening shows that you have symptoms (you have a positive screening result), you may need further evaluation and be referred to a mental health care provider.  Diabetes self-management education  Education about how to manage your diabetes is recommended at diagnosis and ongoing as needed.  Treatment plan  Your treatment plan will be reviewed at every medical visit.  Summary  Managing diabetes (diabetes mellitus) can be complicated. Your diabetes treatment may be managed by a team of health care providers.  Your health care providers follow a schedule in order to  help you get the best quality of care.  Standards of care including having regular physical exams, blood tests, blood pressure monitoring, immunizations, screening tests, and education about how to manage your diabetes.  Your health care providers may also give you more specific instructions based on your individual health.      Type 2 Diabetes Mellitus, Self Care, Adult Caring for yourself after you have been diagnosed with type 2 diabetes (type 2 diabetes mellitus) means keeping your blood sugar (glucose) under control with a balance of:  Nutrition.  Exercise.  Lifestyle changes.  Medicines or insulin, if necessary.  Support from your team of health care providers and others.  The following information explains what you need to know to manage your diabetes at home. What do I need to do to manage my blood glucose?  Check your blood glucose every day, as often as told by your health care provider.  Contact your health care provider if your blood glucose is above your target for 2 tests in a row.  Have your A1c (hemoglobin A1c) level checked at least two times a  year, or as often as told by your health care provider. Your health care provider will set individualized treatment goals for you. Generally, the goal of treatment is to maintain the following blood glucose levels:  Before meals (preprandial): 80-130 mg/dL (4.4-7.2 mmol/L).  After meals (postprandial): below 180 mg/dL (10 mmol/L).  A1c level: less than 7%.  What do I need to know about hyperglycemia and hypoglycemia? What is hyperglycemia? Hyperglycemia, also called high blood glucose, occurs when blood glucose is too high.Make sure you know the early signs of hyperglycemia, such as:  Increased thirst.  Hunger.  Feeling very tired.  Needing to urinate more often than usual.  Blurry vision.  What is hypoglycemia? Hypoglycemia, also called low blood glucose, occurswith a blood glucose level at or below 70 mg/dL (3.9 mmol/L). The risk for hypoglycemia increases during or after exercise, during sleep, during illness, and when skipping meals or not eating for a long time (fasting). It is important to know the symptoms of hypoglycemia and treat it right away. Always have a 15-gram rapid-acting carbohydrate snack with you to treat low blood glucose. Family members and close friends should also know the symptoms and should understand how to treat hypoglycemia, in case you are not able to treat yourself. What are the symptoms of hypoglycemia? Hypoglycemia symptoms can include:  Hunger.  Anxiety.  Sweating and feeling clammy.  Confusion.  Dizziness or feeling light-headed.  Sleepiness.  Nausea.  Increased heart rate.  Headache.  Blurry vision.  Seizure.  Nightmares.  Tingling or numbness around the mouth, lips, or tongue.  A change in speech.  Decreased ability to concentrate.  A change in coordination.  Restless sleep.  Tremors or shakes.  Fainting.  Irritability.  How do I treat hypoglycemia?  If you are alert and able to swallow safely, follow the  15:15 rule:  Take 15 grams of a rapid-acting carbohydrate. Rapid-acting options include: ? 1 tube of glucose gel. ? 3 glucose pills. ? 6-8 pieces of hard candy. ? 4 oz (120 mL) of fruit juice. ? 4 oz (120 mL) of regular (not diet) soda.  Check your blood glucose 15 minutes after you take the carbohydrate.  If the repeat blood glucose level is still at or below 70 mg/dL (3.9 mmol/L), take 15 grams of a carbohydrate again.  If your blood glucose level does not increase above 70 mg/dL (3.9  mmol/L) after 3 tries, seek emergency medical care.  After your blood glucose level returns to normal, eat a meal or a snack within 1 hour.  How do I treat severe hypoglycemia? Severe hypoglycemia is when your blood glucose level is at or below 54 mg/dL (3 mmol/L). Severe hypoglycemia is an emergency. Do not wait to see if the symptoms will go away. Get medical help right away. Call your local emergency services (911 in the [place] Do not drive yourself to the hospital. If you have severe hypoglycemia and you cannot eat or drink, you may need an injection of glucagon. A family member or close friend should learn how to check your blood glucose and how to give you a glucagon injection. Ask your health care provider if you need to have an emergency glucagon injection kit available. Severe hypoglycemia may need to be treated in a hospital. The treatment may include getting glucose through an IV tube. You may also need treatment for the cause of your hypoglycemia. Can having diabetes put me at risk for other conditions? Having diabetes can put you at risk for other long-term (chronic) conditions, such as heart disease and kidney disease. Your health care provider may prescribe medicines to help prevent complications from diabetes. These medicines may include:  Aspirin.  Medicine to lower cholesterol.  Medicine to control blood pressure.  What else can I do to manage my diabetes? Take your diabetes medicines  as told  If your health care provider prescribed insulin or diabetes medicines, take them every day.  Do not run out of insulin or other diabetes medicines that you take. Plan ahead so you always have these available.  If you use insulin, adjust your dosage based on how physically active you are and what foods you eat. Your health care provider will tell you how to adjust your dosage. Make healthy food choices  The things that you eat and drink affect your blood glucose and your insulin dosage. Making good choices helps to control your diabetes and prevent other health problems. A healthy meal plan includes eating lean proteins, complex carbohydrates, fresh fruits and vegetables, low-fat dairy products, and healthy fats. Make an appointment to see a diet and nutrition specialist (registered dietitian) to help you create an eating plan that is right for you. Make sure that you:  Follow instructions from your health care provider about eating or drinking restrictions.  Drink enough fluid to keep your urine clear or pale yellow.  Eat healthy snacks between nutritious meals.  Track the carbohydrates that you eat. Do this by reading food labels and learning the standard serving sizes of foods.  Follow your sick day plan whenever you cannot eat or drink as usual. Make this plan in advance with your health care provider.  Stay active  Exercise regularly, as told by your health care provider. This may include:  Stretching and doing strength exercises, such as yoga or weightlifting, at least 2 times a week.  Doing at least 150 minutes of moderate-intensity or vigorous-intensity exercise each week. This could be brisk walking, biking, or water aerobics. ? Spread out your activity over at least 3 days of the week. ? Do not go more than 2 days in a row without doing some kind of physical activity.  When you start a new exercise or activity, work with your health care provider to adjust your  insulin, medicines, or food intake as needed. Make healthy lifestyle choices  Do not use any tobacco products, such  as cigarettes, chewing tobacco, and e-cigarettes. If you need help quitting, ask your health care provider.  If your health care provider says that alcohol is safe for you, limit alcohol intake to no more than 1 drink per day for nonpregnant women and 2 drinks per day for men. One drink equals 12 oz of beer, 5 oz of wine, or 1 oz of hard liquor.  Learn to manage stress. If you need help with this, ask your health care provider. Care for your body   Keep your immunizations up to date. In addition to getting vaccinations as told by your health care provider, it is recommended that you get vaccinated against the following illnesses: ? The flu (influenza). Get a flu shot every year. ? Pneumonia. ? Hepatitis B.  Schedule an eye exam soon after your diagnosis, and then one time every year after that.  Check your skin and feet every day for cuts, bruises, redness, blisters, or sores. Schedule a foot exam with your health care provider once every year.  Brush your teeth and gums two times a day, and floss at least one time a day. Visit your dentist at least once every 6 months.  Maintain a healthy weight. General instructions  Take over-the-counter and prescription medicines only as told by your health care provider.  Share your diabetes management plan with people in your workplace, school, and household.  Check your urine for ketones when you are ill and as told by your health care provider.  Ask your health care provider: ? Do I need to meet with a diabetes educator? ? Where can I find a support group for people with diabetes?  Carry a medical alert card or wear medical alert jewelry.  Keep all follow-up visits as told by your health care provider. This is important. Where to find more information: For more information about diabetes, visit:  American Diabetes  Association (ADA): www.diabetes.org  American Association of Diabetes Educators (AADE): www.diabeteseducator.org/patient-resources  This information is not intended to replace advice given to you by your health care provider. Make sure you discuss any questions you have with your health care provider. Document Released: 06/03/2015 Document Revised: 07/18/2015 Document Reviewed: 03/15/2015 Elsevier Interactive Patient Education  2017 [place].      Blood Glucose Monitoring, Adult Monitoring your blood sugar (glucose) helps you manage your diabetes. It also helps you and your health care provider determine how well your diabetes management plan is working. Blood glucose monitoring involves checking your blood glucose as often as directed, and keeping a record (log) of your results over time. Why should I monitor my blood glucose? Checking your blood glucose regularly can:  Help you understand how food, exercise, illnesses, and medicines affect your blood glucose.  Let you know what your blood glucose is at any time. You can quickly tell if you are having low blood glucose (hypoglycemia) or high blood glucose (hyperglycemia).  Help you and your health care provider adjust your medicines as needed.  When should I check my blood glucose? Follow instructions from your health care provider about how often to check your blood glucose.   This may depend on:  The type of diabetes you have.  How well-controlled your diabetes is.  Medicines you are taking.  If you have type 1 diabetes:  Check your blood glucose at least 2 times a day.  Also check your blood glucose: ? Before every insulin injection. ? Before and after exercise. ? Between meals. ? 2 hours after  a meal. ? Occasionally between 2:00 a.m. and 3:00 a.m., as directed. ? Before potentially dangerous tasks, like driving or using heavy machinery. ? At bedtime.  You may need to check your blood glucose more often, up to  6-10 times a day: ? If you use an insulin pump. ? If you need multiple daily injections (MDI). ? If your diabetes is not well-controlled. ? If you are ill. ? If you have a history of severe hypoglycemia. ? If you have a history of not knowing when your blood glucose is getting low (hypoglycemia unawareness).  If you have type 2 diabetes:  If you take insulin or other diabetes medicines, check your blood glucose at least 2 times a day.  If you are on intensive insulin therapy, check your blood glucose at least 4 times a day. Occasionally, you may also need to check between 2:00 a.m. and 3:00 a.m., as directed.  Also check your blood glucose: ? Before and after exercise. ? Before potentially dangerous tasks, like driving or using heavy machinery.  You may need to check your blood glucose more often if: ? Your medicine is being adjusted. ? Your diabetes is not well-controlled. ? You are ill.  What is a blood glucose log?  A blood glucose log is a record of your blood glucose readings. It helps you and your health care provider: ? Look for patterns in your blood glucose over time. ? Adjust your diabetes management plan as needed.  Every time you check your blood glucose, write down your result and notes about things that may be affecting your blood glucose, such as your diet and exercise for the day.  Most glucose meters store a record of glucose readings in the meter. Some meters allow you to download your records to a computer. How do I check my blood glucose? Follow these steps to get accurate readings of your blood glucose: Supplies needed   Blood glucose meter.  Test strips for your meter. Each meter has its own strips. You must use the strips that come with your meter.  A needle to prick your finger (lancet). Do not use lancets more than once.  A device that holds the lancet (lancing device).  A journal or log book to write down your results.  Procedure  Wash your  hands with soap and water.  Prick the side of your finger (not the tip) with the lancet. Use a different finger each time.  Gently rub the finger until a small drop of blood appears.  Follow instructions that come with your meter for inserting the test strip, applying blood to the strip, and using your blood glucose meter.  Write down your result and any notes.  Alternative testing sites  Some meters allow you to use areas of your body other than your finger (alternative sites) to test your blood.  If you think you may have hypoglycemia, or if you have hypoglycemia unawareness, do not use alternative sites. Use your finger instead.  Alternative sites may not be as accurate as the fingers, because blood flow is slower in these areas. This means that the result you get may be delayed, and it may be different from the result that you would get from your finger.  The most common alternative sites are: ? Forearm. ? Thigh. ? Palm of the hand.  Additional tips  Always keep your supplies with you.  If you have questions or need help, all blood glucose meters have a 24-hour "hotline" number  that you can call. You may also contact your health care provider.  After you use a few boxes of test strips, adjust (calibrate) your blood glucose meter by following instructions that came with your meter.    The American Diabetes Association suggests the following targets for most nonpregnant adults with diabetes.  More or less stringent glycemic goals may be appropriate for each individual.  A1C: Less than 7% A1C may also be reported as eAG: Less than 154 mg/dl Before a meal (preprandial plasma glucose): 80-130 mg/dl 1-2 hours after beginning of the meal (Postprandial plasma glucose)*: Less than 180 mg/dl  *Postprandial glucose may be targeted if A1C goals are not met despite reaching preprandial glucose goals.   GOALS in short:  The goals are for the Hgb A1C to be less than 7.0 & blood  pressure to be less than 130/80.    It is recommended that all diabetics are educated on and follow a healthy diabetic diet, exercise for 30 minutes 3-4 times per week (walking, biking, swimming, or machine), monitor blood glucose readings and bring that record with you to be reviewed at your next office visit.     You should be checking fasting blood sugars- especially after you eat poorly or eat really healthy, and also check 2 hour postprandial blood sugars after largest meal of the day.    Write these down and bring in your log at each office visit.    You will need to be seen every 3 months by the provider managing your Diabetes unless told otherwise by that provider.   You will need yearly eye exams from an eye specialist and foot exams to check the nerves of your feet.  Also, your urine should be checked yearly as well to make sure excess protein is not present.   If you are checking your blood pressure at home, please record it and bring it to your next office visit.    Follow the Dietary Approaches to Stop Hypertension (DASH) diet (3 servings of fruit and vegetables daily, whole grains, low sodium, low-fat proteins).  See below.    Lastly, when it comes to your cholesterol, the goal is to have the HDL (good cholesterol) >40, and the LDL (bad cholesterol) <100.   It is recommended that you follow a heart healthy, low saturated and trans-fat diet and exercise for 30 minutes at least 5 times a week.     (( Check out the DASH diet = 1.5 Gram Low Sodium Diet   A 1.5 gram sodium diet restricts the amount of sodium in the diet to no more than 1.5 g or 1500 mg daily.  The American Heart Association recommends Americans over the age of 72 to consume no more than 1500 mg of sodium each day to reduce the risk of developing high blood pressure.  Research also shows that limiting sodium may reduce heart attack and stroke risk.  Many foods contain sodium for flavor and sometimes as a preservative.  When  the amount of sodium in a diet needs to be low, it is important to know what to look for when choosing foods and drinks.  The following includes some information and guidelines to help make it easier for you to adapt to a low sodium diet.    QUICK TIPS  Do not add salt to food.  Avoid convenience items and fast food.  Choose unsalted snack foods.  Buy lower sodium products, often labeled as "lower sodium" or "no salt added."  Check food labels to learn how much sodium is in 1 serving.  When eating at a restaurant, ask that your food be prepared with less salt or none, if possible.    READING FOOD LABELS FOR SODIUM INFORMATION  The nutrition facts label is a good place to find how much sodium is in foods. Look for products with no more than 400 mg of sodium per serving.  Remember that 1.5 g = 1500 mg.  The food label may also list foods as:  Sodium-free: Less than 5 mg in a serving.  Very low sodium: 35 mg or less in a serving.  Low-sodium: 140 mg or less in a serving.  Light in sodium: 50% less sodium in a serving. For example, if a food that usually has 300 mg of sodium is changed to become light in sodium, it will have 150 mg of sodium.  Reduced sodium: 25% less sodium in a serving. For example, if a food that usually has 400 mg of sodium is changed to reduced sodium, it will have 300 mg of sodium.    CHOOSING FOODS  Grains  Avoid: Salted crackers and snack items. Some cereals, including instant hot cereals. Bread stuffing and biscuit mixes. Seasoned rice or pasta mixes.  Choose: Unsalted snack items. Low-sodium cereals, oats, puffed wheat and rice, shredded wheat. English muffins and bread. Pasta.  Meats  Avoid: Salted, canned, smoked, spiced, pickled meats, including fish and poultry. Bacon, ham, sausage, cold cuts, hot dogs, anchovies.  Choose: Low-sodium canned tuna and salmon. Fresh or frozen meat, poultry, and fish.  Dairy  Avoid: Processed cheese and spreads. Cottage cheese.  Buttermilk and condensed milk. Regular cheese.  Choose: Milk. Low-sodium cottage cheese. Yogurt. Sour cream. Low-sodium cheese.  Fruits and Vegetables  Avoid: Regular canned vegetables. Regular canned tomato sauce and paste. Frozen vegetables in sauces. Olives. Angie Fava. Relishes. Sauerkraut.  Choose: Low-sodium canned vegetables. Low-sodium tomato sauce and paste. Frozen or fresh vegetables. Fresh and frozen fruit.  Condiments  Avoid: Canned and packaged gravies. Worcestershire sauce. Tartar sauce. Barbecue sauce. Soy sauce. Steak sauce. Ketchup. Onion, garlic, and table salt. Meat flavorings and tenderizers.  Choose: Fresh and dried herbs and spices. Low-sodium varieties of mustard and ketchup. Lemon juice. Tabasco sauce. Horseradish.    SAMPLE 1.5 GRAM SODIUM MEAL PLAN:   Breakfast / Sodium (mg)  1 cup low-fat milk / 143 mg  1 whole-wheat English muffin / 240 mg  1 tbs heart-healthy margarine / 153 mg  1 hard-boiled egg / 139 mg  1 small orange / 0 mg  Lunch / Sodium (mg)  1 cup raw carrots / 76 mg  2 tbs no salt added peanut butter / 5 mg  2 slices whole-wheat bread / 270 mg  1 tbs jelly / 6 mg   cup red grapes / 2 mg  Dinner / Sodium (mg)  1 cup whole-wheat pasta / 2 mg  1 cup low-sodium tomato sauce / 73 mg  3 oz lean ground beef / 57 mg  1 small side salad (1 cup raw spinach leaves,  cup cucumber,  cup yellow bell pepper) with 1 tsp olive oil and 1 tsp red wine vinegar / 25 mg  Snack / Sodium (mg)  1 container low-fat vanilla yogurt / 107 mg  3 graham cracker squares / 127 mg  Nutrient Analysis  Calories: 1745  Protein: 75 g  Carbohydrate: 237 g  Fat: 57 g  Sodium: 1425 mg  Document Released: 02/09/2005 Document Revised: 10/22/2010  Document Reviewed: 05/13/2009  [facility] Patient Information 2012 [place].))    This information is not intended to replace advice given to you by your health care provider. Make sure you discuss any questions you have with your  health care provider. Document Released: 02/12/2003 Document Revised: 08/30/2015 Document Reviewed: 07/22/2015 Elsevier Interactive Patient Education  2017 [employer].

## 2017-12-17 ENCOUNTER — Other Ambulatory Visit: Payer: BLUE CROSS/BLUE SHIELD

## 2017-12-17 NOTE — Progress Notes (Signed)
Pt here for redraw of labs.  Unable to collect on 12/16/17.  Charyl Bigger, CMA

## 2017-12-18 LAB — COMPREHENSIVE METABOLIC PANEL
A/G RATIO: 2.5 — AB (ref 1.2–2.2)
ALT: 168 IU/L — AB (ref 0–32)
AST: 91 IU/L — ABNORMAL HIGH (ref 0–40)
Albumin: 4.5 g/dL (ref 3.5–5.5)
Alkaline Phosphatase: 68 IU/L (ref 39–117)
BILIRUBIN TOTAL: 1.2 mg/dL (ref 0.0–1.2)
BUN / CREAT RATIO: 12 (ref 9–23)
BUN: 9 mg/dL (ref 6–24)
CHLORIDE: 97 mmol/L (ref 96–106)
CO2: 22 mmol/L (ref 20–29)
Calcium: 9.5 mg/dL (ref 8.7–10.2)
Creatinine, Ser: 0.78 mg/dL (ref 0.57–1.00)
GFR calc non Af Amer: 86 mL/min/{1.73_m2} (ref >59)
GFR, EST AFRICAN AMERICAN: 99 mL/min/{1.73_m2} (ref >59)
GLUCOSE: 298 mg/dL — AB (ref 65–99)
Globulin, Total: 1.8 g/dL (ref 1.5–4.5)
Potassium: 4.7 mmol/L (ref 3.5–5.2)
Sodium: 137 mmol/L (ref 134–144)
TOTAL PROTEIN: 6.3 g/dL (ref 6.0–8.5)

## 2017-12-18 LAB — TSH: TSH: 7.11 u[IU]/mL — AB (ref 0.450–4.500)

## 2017-12-18 LAB — CBC WITH DIFFERENTIAL/PLATELET
BASOS ABS: 0 10*3/uL (ref 0.0–0.2)
Basos: 1 %
EOS (ABSOLUTE): 0 10*3/uL (ref 0.0–0.4)
Eos: 1 %
HEMOGLOBIN: 14.9 g/dL (ref 11.1–15.9)
Hematocrit: 42.9 % (ref 34.0–46.6)
IMMATURE GRANS (ABS): 0 10*3/uL (ref 0.0–0.1)
Immature Granulocytes: 1 %
LYMPHS: 33 %
Lymphocytes Absolute: 1.4 10*3/uL (ref 0.7–3.1)
MCH: 33.1 pg — AB (ref 26.6–33.0)
MCHC: 34.7 g/dL (ref 31.5–35.7)
MCV: 95 fL (ref 79–97)
MONOCYTES: 9 %
Monocytes Absolute: 0.4 10*3/uL (ref 0.1–0.9)
NEUTROS ABS: 2.3 10*3/uL (ref 1.4–7.0)
Neutrophils: 55 %
PLATELETS: 201 10*3/uL (ref 150–450)
RBC: 4.5 x10E6/uL (ref 3.77–5.28)
RDW: 13.6 % (ref 12.3–15.4)
WBC: 4.1 10*3/uL (ref 3.4–10.8)

## 2017-12-18 LAB — LIPID PANEL
CHOL/HDL RATIO: 7.7 ratio — AB (ref 0.0–4.4)
Cholesterol, Total: 224 mg/dL — ABNORMAL HIGH (ref 100–199)
HDL: 29 mg/dL — ABNORMAL LOW (ref >39)
Triglycerides: 498 mg/dL — ABNORMAL HIGH (ref 0–149)

## 2017-12-18 LAB — VITAMIN D 25 HYDROXY (VIT D DEFICIENCY, FRACTURES): VIT D 25 HYDROXY: 28.6 ng/mL — AB (ref 30.0–100.0)

## 2017-12-18 LAB — T4, FREE: Free T4: 1.46 ng/dL (ref 0.82–1.77)

## 2017-12-21 ENCOUNTER — Encounter: Payer: Self-pay | Admitting: Family Medicine

## 2017-12-26 ENCOUNTER — Other Ambulatory Visit: Payer: Self-pay | Admitting: Family Medicine

## 2017-12-26 DIAGNOSIS — E559 Vitamin D deficiency, unspecified: Secondary | ICD-10-CM

## 2018-01-24 ENCOUNTER — Encounter: Payer: Self-pay | Admitting: Family Medicine

## 2018-01-27 ENCOUNTER — Other Ambulatory Visit: Payer: Self-pay | Admitting: Family Medicine

## 2018-01-27 DIAGNOSIS — Z1239 Encounter for other screening for malignant neoplasm of breast: Secondary | ICD-10-CM

## 2018-01-28 ENCOUNTER — Other Ambulatory Visit: Payer: Self-pay

## 2018-01-28 ENCOUNTER — Other Ambulatory Visit (INDEPENDENT_AMBULATORY_CARE_PROVIDER_SITE_OTHER): Payer: BLUE CROSS/BLUE SHIELD

## 2018-01-28 DIAGNOSIS — R748 Abnormal levels of other serum enzymes: Secondary | ICD-10-CM

## 2018-01-29 LAB — COMPREHENSIVE METABOLIC PANEL
A/G RATIO: 2.3 — AB (ref 1.2–2.2)
ALT: 115 IU/L — AB (ref 0–32)
AST: 67 IU/L — AB (ref 0–40)
Albumin: 4.6 g/dL (ref 3.5–5.5)
Alkaline Phosphatase: 67 IU/L (ref 39–117)
BILIRUBIN TOTAL: 0.7 mg/dL (ref 0.0–1.2)
BUN/Creatinine Ratio: 11 (ref 9–23)
BUN: 8 mg/dL (ref 6–24)
CHLORIDE: 98 mmol/L (ref 96–106)
CO2: 26 mmol/L (ref 20–29)
Calcium: 9.4 mg/dL (ref 8.7–10.2)
Creatinine, Ser: 0.75 mg/dL (ref 0.57–1.00)
GFR calc Af Amer: 104 mL/min/{1.73_m2} (ref >59)
GFR calc non Af Amer: 90 mL/min/{1.73_m2} (ref >59)
GLOBULIN, TOTAL: 2 g/dL (ref 1.5–4.5)
Glucose: 151 mg/dL — ABNORMAL HIGH (ref 65–99)
Potassium: 4.3 mmol/L (ref 3.5–5.2)
SODIUM: 139 mmol/L (ref 134–144)
Total Protein: 6.6 g/dL (ref 6.0–8.5)

## 2018-02-02 ENCOUNTER — Encounter: Payer: Self-pay | Admitting: Family Medicine

## 2018-02-04 ENCOUNTER — Other Ambulatory Visit: Payer: Self-pay

## 2018-02-04 DIAGNOSIS — E119 Type 2 diabetes mellitus without complications: Secondary | ICD-10-CM

## 2018-02-04 MED ORDER — METFORMIN HCL 500 MG PO TABS: ORAL_TABLET | ORAL | 1 refills | Status: DC

## 2018-03-07 ENCOUNTER — Ambulatory Visit
Admission: RE | Admit: 2018-03-07 | Discharge: 2018-03-07 | Disposition: A | Discharge status: Home / Self Care | Payer: BLUE CROSS/BLUE SHIELD | Source: Ambulatory Visit | Attending: Family Medicine | Admitting: Family Medicine

## 2018-03-07 DIAGNOSIS — Z1239 Encounter for other screening for malignant neoplasm of breast: Secondary | ICD-10-CM

## 2018-03-23 ENCOUNTER — Ambulatory Visit: Payer: Self-pay | Admitting: Family Medicine

## 2018-04-21 ENCOUNTER — Other Ambulatory Visit: Payer: Self-pay

## 2018-04-21 ENCOUNTER — Telehealth: Payer: Self-pay | Admitting: Family Medicine

## 2018-04-21 DIAGNOSIS — E119 Type 2 diabetes mellitus without complications: Secondary | ICD-10-CM

## 2018-04-21 NOTE — Telephone Encounter (Signed)
Sent to Dr. Raliegh Scarlet to review and advise. MPulliam, CMA/RT(R)

## 2018-04-21 NOTE — Telephone Encounter (Signed)
Patient ran out of her metformin early and is requesting a refill of this. She states that Dr. Jenetta Downer prescribes the med but that she sees an endocrinologist in [place] who had her up her dose slightly so she has ran out sooner than expected. Patient is coming into our office for a chronic f/u OV tomorrow morning but wanted me to send this message as she is out completely. If approved please send to [facility] on [address].

## 2018-04-21 NOTE — Telephone Encounter (Signed)
Spoke to the patient she states that she is not seeing endo for her DM, that is being managed by Dr. Raliegh Scarlet.  However when she saw him in December he advised that she should be taking 2 tabs bid instead of what she was taking and patient started taking what 2 tabs bid.  Patient states that she only see him once a year and does not want to see him more then that.  She would like our office to cont to manage her DM.  Please review and advise if refill approved. MPulliam, CMA/RT(R)

## 2018-04-21 NOTE — Telephone Encounter (Signed)
Patient called back to follow up with Melissa with med refill request, she will be out of the house the rest of the day so when you do call her back please use patient's mobile number

## 2018-04-21 NOTE — Telephone Encounter (Signed)
Called and spoke to patient please see previous note. MPulliam, CMA/RT(R)

## 2018-04-21 NOTE — Telephone Encounter (Signed)
She needs to get from Endo- the specilist that is treating her condition and changing doses etc.

## 2018-04-21 NOTE — Telephone Encounter (Signed)
Patient is request a refill on metformin. Last filled 12/16/2017 by our office. Patient states that her endocrinologist recently changed dose to 2 tablets bid. Patient is currently out of medication due to this change. LOV 12/16/2017.  Patient has a follow up with our office tomorrow morning.  Please review and advise if we can refill or if she needs to contact endo office since they made change to medication. MPulliam, CMA/RT(R)

## 2018-04-22 ENCOUNTER — Encounter: Payer: Self-pay | Admitting: Family Medicine

## 2018-04-22 ENCOUNTER — Ambulatory Visit: Payer: BLUE CROSS/BLUE SHIELD | Admitting: Family Medicine

## 2018-04-22 VITALS — BP 161/84 | HR 65 | Temp 98.3°F | Ht 68.0 in | Wt 264.2 lb

## 2018-04-22 DIAGNOSIS — E1169 Type 2 diabetes mellitus with other specified complication: Secondary | ICD-10-CM

## 2018-04-22 DIAGNOSIS — I10 Essential (primary) hypertension: Secondary | ICD-10-CM

## 2018-04-22 DIAGNOSIS — E1159 Type 2 diabetes mellitus with other circulatory complications: Secondary | ICD-10-CM | POA: Diagnosis not present

## 2018-04-22 DIAGNOSIS — R809 Proteinuria, unspecified: Secondary | ICD-10-CM

## 2018-04-22 DIAGNOSIS — E782 Mixed hyperlipidemia: Secondary | ICD-10-CM

## 2018-04-22 DIAGNOSIS — E119 Type 2 diabetes mellitus without complications: Secondary | ICD-10-CM | POA: Diagnosis not present

## 2018-04-22 DIAGNOSIS — E1129 Type 2 diabetes mellitus with other diabetic kidney complication: Secondary | ICD-10-CM

## 2018-04-22 DIAGNOSIS — E781 Pure hyperglyceridemia: Secondary | ICD-10-CM

## 2018-04-22 LAB — POCT GLYCOSYLATED HEMOGLOBIN (HGB A1C): HEMOGLOBIN A1C: 5.2 % (ref 4.0–5.6)

## 2018-04-22 MED ORDER — LIRAGLUTIDE 18 MG/3ML ~~LOC~~ SOPN: PEN_INJECTOR | SUBCUTANEOUS | 1 refills | Status: DC

## 2018-04-22 MED ORDER — OMEGA-3-ACID ETHYL ESTERS 1 G PO CAPS: 2.0000 g | ORAL_CAPSULE | Freq: Two times a day (BID) | ORAL | 1 refills | Status: DC

## 2018-04-22 MED ORDER — METOPROLOL SUCCINATE ER 100 MG PO TB24: ORAL_TABLET | ORAL | 1 refills | Status: DC

## 2018-04-22 MED ORDER — AMLODIPINE-VALSARTAN-HCTZ 5-160-12.5 MG PO TABS: 1.0000 | ORAL_TABLET | Freq: Every day | ORAL | 1 refills | Status: DC

## 2018-04-22 MED ORDER — METFORMIN HCL 500 MG PO TABS: ORAL_TABLET | ORAL | 1 refills | Status: DC

## 2018-04-22 NOTE — Progress Notes (Signed)
Impression and Recommendations:    1. Type 2 diabetes mellitus without complication, without long-term current use of insulin ([place])   2. Hypertension associated with diabetes ([place])   3. Mixed diabetic hyperlipidemia associated with type 2 diabetes mellitus ([place])   4. Microalbuminuria due to type 2 diabetes mellitus ([place])   5. Type 2 diabetes mellitus with hypertriglyceridemia (HCC)      1. Type 2 diabetes mellitus without complication, without long-term current use of insulin ([place])  - Last visit, started metformin.   Per patient, she has increased her dose of metformin to 1000 BID per her Endo doc.   - Since BS's still too high at home, so we discussed additional DM med today   - after R/B meds d/c pt- we decided on one for Wt Loss - Begin Liraglutide today.  - cont metformin   - Pt knows that the victoza sig states to escalate dose BUT she understands to NOT inc dose if BS under good control when checking daily.  She will call with any Q's prior to next visit if needed  - Counseled patient on pathophysiology of disease and discussed various treatment options, which often includes dietary and lifestyle modifications as first line.  Importance of low carb/prudent diet discussed with patient in addition to regular exercise.   - Check FBS and 2 hours after the biggest meal of your day.   - Stressed importance to pt of close home monitoring while we are adding/ changing meds for pt's BS and BP.   -  Keep log and bring in next OV for my review.  Also, if you ever feel poorly, please check your blood pressure and blood sugar, as one or the other could be the cause of your symptoms.  - Being a diabetic, you need yearly eye and foot exams.  PT said she will Make appt. for a diabetic eye exam.  - POCT glycosylated hemoglobin (Hb A1C) - metFORMIN (GLUCOPHAGE) 500 MG tablet; 2 tabs q am, 3 tabs q hs  Dispense: 450 tablet; Refill: 1 - liraglutide (VICTOZA) 18 MG/3ML SOPN; 0.13m [place] qd *  1 wk, then 1.273mSC qd * 1 wk, then 1.94m43mC qd as tolerated  Dispense: 10 pen; Refill: 1 - Comprehensive metabolic panel; Future - Hemoglobin A1c; Future - Magnesium; Future - B12; Future   2. Hypertension associated with diabetes ([place]- Started blood pressure medication last visit- losartan 43m83mth little effect on BP.  - Blood pressure suboptimally managed at this time. - Recommended change in treatment plan today.  - Discontinue losartan.  - Begin combination pill today-->   Valsartan-amlodipine-hctz  Lifestyle changes such as dash diet and engaging in a regular exercise program discussed with patient at length today.  Educational handouts provided  Ambulatory BP monitoring encouraged. Keep log and bring in next OV  Also, risks and benefits of medications discussed with patient, including alternative treatments.   Encouraged patient to read drug information handouts to further educate self about the medicine prior to starting it.   Contact us [place] with any Q's/ concerns.  - ADD:  amLODIPine-Valsartan-HCTZ 5-160-12.5 MG TABS; Take 1 tablet by mouth daily.  Dispense: 90 tablet; Refill: 1 - Cont.  metoprolol succinate (TOPROL-XL) 100 MG 24 hr tablet; 1 tab BID  Dispense: 180 tablet; Refill: 1 - Comprehensive metabolic panel; Future - Hemoglobin A1c; Future - Magnesium; Future - B12; Future   3. Mixed diabetic hyperlipidemia associated with type 2 diabetes mellitus (HCC)[place]  D/Ced lipitor couple mo ago due to elevated liver enzymes/ fatty liver dx - we will cont to monitor labs/ lft's - d/c OTC fish oil  -  ADD Prescription:  omega-3 acid ethyl esters (LOVAZA) 1 g capsule; Take 2 capsules (2 g total) by mouth 2 (two) times daily.  Dispense: 360 capsule; Refill: 1 - Comprehensive metabolic panel; Future - low saturated & trans fat diets discussed with patient.   - Encouraged regular exercise and weight loss    4. Microalbuminuria due to type 2 diabetes mellitus (HCC)  -   Valsartan combo pill prescribed today;  Will check yrly  - importance of BP and BS control to prevent worsening of proteinuria d/c pt  - Comprehensive metabolic panel; Future - Hemoglobin A1c; Future - Magnesium; Future - B12; Future   5. 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  Dispense: 360 capsule; Refill: 1 - Comprehensive metabolic panel; Future - Hemoglobin A1c; Future - Magnesium; Future - B12; Future  - Dietary changes such as low carb, high protein and high fiber d/c pt    4. BMI Counseling - BMI of 40.17  Explained to patient what BMI refers to, and what it means medically.  Told patient to think about it as a "medical risk stratification measurement" and how increasing BMI is associated with increasing risk/ or worsening state of various diseases such as hypertension, hyperlipidemia, diabetes, premature OA, depression etc.  American Heart Association guidelines for healthy diet, basically Mediterranean diet, and exercise guidelines of 30 minutes 5 days per week or more discussed in detail.   - STRONGLY Encouraged low carb, high protein, high fiber diet of appropriate calories.   Health counseling performed.  All questions answered.   5. Lifestyle & Preventative Health Maintenance - Advised patient to continue working toward exercising to improve overall mental, physical, and emotional health.    - Reviewed the "spokes of the wheel" of mood and health management.  Stressed the importance of ongoing prudent habits, including regular exercise, appropriate sleep hygiene, healthful dietary habits, and prayer/meditation to relax.  - Encouraged patient to engage in daily physical activity, especially a formal exercise routine.  Recommended that the patient eventually strive for at least 150 minutes (up to  450 minutes) of moderate cardiovascular activity per week according to guidelines established by the AHA.   - Healthy dietary habits encouraged, including low-carb, and high amounts of lean protein in diet.   - Patient should also consume adequate amounts of water.    Education and routine counseling performed. Handouts provided.   Pt was interviewed and evaluated by me in the clinic today for 32.5+ minutes, with over 50% time spent in face to face counseling of patients various medical conditions, treatment plans of those medical conditions including medicine management and lifestyle modification, strategies to improve health and well being; and in coordination of care. SEE ABOVE TREATMENT PLAN FOR DETAILS   Orders Placed This Encounter  Procedures  . Comprehensive metabolic panel  . Hemoglobin A1c  . Magnesium  . B12  . POCT glycosylated hemoglobin (Hb A1C)    Medications Discontinued During This Encounter  Medication Reason  . Ascorbic Acid (VITAMIN C) 1000 MG tablet Patient Preference  . Garlic 3267 MG CAPS Patient Preference  . atorvastatin (LIPITOR) 20 MG tablet Side effect (s)  .  losartan (COZAAR) 50 MG tablet Change in therapy  . metoprolol succinate (TOPROL-XL) 100 MG 24 hr tablet   . metFORMIN (GLUCOPHAGE) 500 MG tablet       Meds ordered this encounter  Medications  . amLODIPine-Valsartan-HCTZ 5-160-12.5 MG TABS    Sig: Take 1 tablet by mouth daily.    Dispense:  90 tablet    Refill:  1  . metoprolol succinate (TOPROL-XL) 100 MG 24 hr tablet    Sig: 1 tab BID    Dispense:  180 tablet    Refill:  1    Please consider 90 day supplies to promote better adherence  . metFORMIN (GLUCOPHAGE) 500 MG tablet    Sig: 2 tabs q am, 3 tabs q hs    Dispense:  450 tablet    Refill:  1  . liraglutide (VICTOZA) 18 MG/3ML SOPN    Sig: 0.59m [place] qd * 1 wk, then 1.210mSC qd * 1 wk, then 1.65m4mC qd as tolerated    Dispense:  10 pen    Refill:  1  . omega-3 acid ethyl esters  (LOVAZA) 1 g capsule    Sig: Take 2 capsules (2 g total) by mouth 2 (two) times daily.    Dispense:  360 capsule    Refill:  1    The patient was counseled, risk factors were discussed, anticipatory guidance given.  Gross side effects, risk and benefits, and alternatives of medications discussed with patient.  Patient is aware that all medications have potential side effects and we are unable to predict every side effect or drug-drug interaction that may occur.  Expresses verbal understanding and consents to current therapy plan and treatment regimen.   Return for 2) 5 wks f/up OV- several med changes; also bldwrk 3 d prior.   Please see AVS handed out to patient at the end of our visit for further patient instructions/ counseling done pertaining to today's office visit.    Note:  This document was prepared using Dragon voice recognition software and may include unintentional dictation errors.  This document serves as a record of services personally performed by DebMellody DanceO. It was created on her behalf by KatToni Amend trained medical scribe. The creation of this record is based on the scribe's personal observations and the provider's statements to them.   I have reviewed the above medical documentation for accuracy and completeness and I concur.  DebMellody DanceO 04/22/2018 8:12 PM        Subjective:    Chief Complaint  Patient presents with  . Diabetes    Miranda Delacruz a 57 15o. female [facility]     Last OV we Diagnosed pt with DM and started pt on a DM med (metformin), also an additional BP med- an ARB and also lipitor.    DM HPI:  Started pt on metformin last OV.  This was for new onset DM.  -  She has been working a little on diet and exercise for diabetes.  Notes she has been doing intermittent fasting and trying to even out her blood sugar.  Says she's been  experimenting with food to "keep her blood sugar level."  Pt is currently maintained on the following medications for diabetes: see med list today Medication compliance - continues on medications as advised.  Home glucose readings range - states her blood sugars have really changed since increasing her metformin  to 2000 per day, but still running 150's-170's fasting.  Lowest one time was 116, highest was 228.     Patient was told by her endocrinologist, whom she saw recently, to slowly go up to 1000 BID metformin.  She is currently taking metformin 1000 mg BID.   --However, he told her to have her DM managed by her PCP / [place]   Denies polyuria/polydipsia.  Denies hypo/ hyperglycemia symptoms  - She denies new onset of: chest pain, exercise intolerance, shortness of breath, dizziness, visual changes, headache, lower extremity swelling or claudication.   Last diabetic eye exam was No results found for: HMDIABEYEEXA  Foot exam- UTD per CMA  Last A1C in the office was:  Lab Results  Component Value Date   HGBA1C 5.2 04/22/2018   HGBA1C 8.1 (A) 12/16/2017   HGBA1C 5.2 11/23/2016    Lab Results  Component Value Date   MICROALBUR 80 12/16/2017   [place] Comment 12/17/2017   CREATININE 0.75 01/28/2018      CHOL with DM HPI:   -  She was started on low dose lipitor last OV in addition to the three other meds.   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   Patient states she never took the atorvastatin due to [place] calling her and telling her to not take it due to ALT/ AST too high once labs came back.    - Pt had an ultrasound in the past, 2017 or 2016, to evaluate her elevated liver enzymes.   Findings sign. for steatosis and pt was told to lose wt.   Her enzymes went down into normal ranges when she lost weight as instructed.  Now, with wt gain, they are  back up.  Patient reports very little compliance with low chol/ saturated and trans fat diet until very recently.  No exercise  No other s-e  Last lipid panel as follows:  Lab Results  Component Value Date   CHOL 224 (H) 12/17/2017   HDL 29 (L) 12/17/2017   LDLCALC Comment 12/17/2017   TRIG 498 (H) 12/17/2017   CHOLHDL 7.7 (H) 12/17/2017    Hepatic Function Latest Ref Rng & Units 01/28/2018 12/17/2017 11/23/2016  Total Protein 6.0 - 8.5 g/dL 6.6 6.3 6.5  Albumin 3.5 - 5.5 g/dL 4.6 4.5 4.8  AST 0 - 40 IU/L 67(H) 91(H) 23  ALT 0 - 32 IU/L 115(H) 168(H) 31  Alk Phosphatase 39 - 117 IU/L 67 68 65  Total Bilirubin 0.0 - 1.2 mg/dL 0.7 1.2 0.7      HTN with DM HPI:  -  Last OV, we started pt on losartan for better BP control and also due to her having proteinuria.   She is tolerating this new med well, w/o S-E but just not at goal.   -  Her blood pressure has not been controlled at home.  Pt is checking it at home.    Notes her blood pressure med "is not doing much."  States her blood pressure "hasn't really changed much since starting it."    At home, her blood pressure is running above or around 140/80-ish.     States she is taking metoprolol one tab BID-  NOT as written in chart.  We will make this correction today.    - Patient reports good compliance with blood pressure medications  -  Denies medication S-E   - Smoking Status noted   - She denies new onset of: chest pain, exercise intolerance, shortness of breath, dizziness, visual changes, headache, lower extremity swelling or claudication.   Last 3 blood pressure readings in our office are as follows: BP Readings from Last 3 Encounters:  04/22/18 (!) 161/84  12/16/17 140/84  02/08/17 (!) 162/86    Filed Weights   04/22/18 1052  Weight: 264 lb 3.2 oz (119.8 kg)     BMI Readings from Last 3 Encounters:  04/22/18 40.17 kg/m  12/16/17 41.21 kg/m  02/08/17 39.72 kg/m      Patient Care Team     Relationship Specialty Notifications Start End  [name], DO PCP - General Family Medicine  09/24/15   Sheran Fava, MD Referring Physician Endocrinology  10/01/16    Comment: [facility] [place], [place]-  Txs her thyroid as well her DM now     Patient Active Problem List   Diagnosis Date Noted  . Migraine headache with aura 02/11/2015    Priority: High  . Hyperlipidemia 12/10/2014    Priority: High  . Obesity, Class III, BMI 40-49.9 (morbid obesity) ([place]) 11/21/2014    Priority: High  . HTN (hypertension) 11/21/2014    Priority: High  . Hypothyroidism 11/21/2014    Priority: Medium  . Hx of papillary thyroid cancer 11/21/2014    Priority: Medium  . Counseling on health promotion and disease prevention 07/31/2016    Priority: Low  . Fatty liver disease, nonalcoholic 04/54/0981    Priority: Low  . Elevated liver enzymes- from fatty liver disease. 12/10/2014    Priority: Low  . Vitamin D deficiency 12/10/2014    Priority: Low  . Type 2 diabetes mellitus with hypertriglyceridemia ([place]) 04/22/2018  . Type 2 diabetes mellitus without complication, without long-term current use of insulin ([place]) 12/16/2017  . Hypertension associated with diabetes ([place]) 12/16/2017  . Mixed diabetic hyperlipidemia associated with type 2 diabetes mellitus ([place]) 12/16/2017  . Microalbuminuria due to type 2 diabetes mellitus ([place]) 12/16/2017  . Dysuria 02/08/2017  . Abnormal urinalysis 02/08/2017  . Cervical cancer screening 07/31/2016  . Screening for breast cancer 07/31/2016  . Low serum HDL 10/24/2015  . Menopause present 09/25/2015  . Special screening for malignant neoplasms, colon   . Benign neoplasm of ascending colon   . Benign neoplasm of transverse colon   . Benign neoplasm of sigmoid colon      Past Medical History:  Diagnosis Date  . Cancer ([place])   . Hyperlipidemia   . Hypertension   . Thyroid disease      Past Surgical History:  Procedure Laterality Date  .  COLONOSCOPY  2000   normal  . COLONOSCOPY WITH PROPOFOL N/A 01/22/2015   Procedure: COLONOSCOPY WITH PROPOFOL;  Surgeon: Lucilla Lame, MD;  Location: [facility] ENDOSCOPY;  Service: Endoscopy;  Laterality: N/A;  . THYROIDECTOMY    . TONSILLECTOMY    . TUBAL LIGATION       Family History  Problem Relation Age of Onset  . Hypertension Mother   . Hypertension Maternal Grandmother   . Stroke Maternal Grandmother      Social History   Substance and Sexual Activity  Drug Use No  ,  Social History   Substance and Sexual Activity  Alcohol Use Yes  . Alcohol/week: 0.0 standard drinks  ,  Social History   Tobacco Use  Smoking Status Former Smoker  . Last attempt to quit: 02/24/1987  . Years since  quitting: 31.1  Smokeless Tobacco Never Used  ,    Current Outpatient Medications on File Prior to Visit  Medication Sig Dispense Refill  . Chromium 1000 MCG TABS Take 1 tablet by mouth daily.    Marland Kitchen Co-Enzyme Q10 200 MG CAPS Take 400 mg by mouth daily.     . Collagen 500 MG CAPS Take 2,500 mg by mouth daily.    Marland Kitchen CRANBERRY ULTRA STRENGTH PO Take 1 tablet by mouth daily.    Marland Kitchen levothyroxine (SYNTHROID, LEVOTHROID) 150 MCG tablet Take 1 tablet by mouth daily.    . magnesium oxide (MAG-OX) 400 MG tablet Take 2 tablets by mouth daily.    . Menaquinone-7 (VITAMIN K2 PO) Take 6 mg by mouth daily.    . milk thistle 175 MG tablet Take 175 mg by mouth daily.    . Omega-3 Fatty Acids (FISH OIL) 1200 MG CAPS Take 2 capsules by mouth daily.    . TURMERIC PO Take 2 tablets by mouth daily.    . vitamin B-12 (CYANOCOBALAMIN) 1000 MCG tablet Take 1,000 mcg by mouth daily.    . Vitamin D, Ergocalciferol, (DRISDOL) 50000 units CAPS capsule TAKE 1 CAPSULE BY MOUTH ONCE A WEEK 12 capsule 10  . zinc gluconate 50 MG tablet Take 1 tablet by mouth daily.     No current facility-administered medications on file prior to visit.      Allergies  Allergen Reactions  . Statins Other (See Comments)    Elevated  LFTs     Review of Systems:   General:  Denies fever, chills Optho/Auditory:   Denies visual changes, blurred vision Respiratory:   Denies SOB, cough, wheeze, DIB  Cardiovascular:   Denies chest pain, palpitations, painful respirations Gastrointestinal:   Denies nausea, vomiting, diarrhea.  Endocrine:     Denies new hot or cold intolerance Musculoskeletal:  Denies joint swelling, gait issues, or new unexplained myalgias/ arthralgias Skin:  Denies rash, suspicious lesions  Neurological:    Denies dizziness, unexplained weakness, numbness  Psychiatric/Behavioral:   Denies mood changes    Objective:     Blood pressure (!) 161/84, pulse 65, temperature 98.3 F (36.8 C), temperature source Oral, height 5' 8"  (1.727 m), weight 264 lb 3.2 oz (119.8 kg), SpO2 97 %.  Body mass index is 40.17 kg/m.  General: Well Developed, well nourished, and in no acute distress.  HEENT: Normocephalic, atraumatic, pupils equal round reactive to light, neck supple, No carotid bruits, no JVD Skin: Warm and dry, cap RF less 2 sec Cardiac: Regular rate and rhythm, S1, S2 WNL's, no murmurs rubs or gallops Respiratory: ECTA B/L, Not using accessory muscles, speaking in full sentences. NeuroM-Sk: Ambulates w/o assistance, moves ext * 4 w/o difficulty, sensation grossly intact.  Ext: scant edema b/l lower ext Psych: No HI/SI, judgement and insight good, Euthymic mood. Full Affect.

## 2018-04-22 NOTE — Patient Instructions (Addendum)
Hey I also sent you in a prescription fish oil into your pharmacy.  Please stop the over-the-counter ones and just take the prescription once as written.  This would be better for you than the others.   After all these medication changes, we will see you in 4 to 6 weeks and will need blood work at that time-so please also come in 3 days prior for a CMP, A1c, magnesium and B12.  That way we will have the results when you come in     Risk factors for prediabetes and type 2 diabetes  Researchers don't fully understand why some people develop prediabetes and type 2 diabetes and others don't.  It's clear that certain factors increase the risk, however, including:  Weight. The more fatty tissue you have, the more resistant your cells become to insulin.  Inactivity. The less active you are, the greater your risk. Physical activity helps you control your weight, uses up glucose as energy and makes your cells more sensitive to insulin.  Family history. Your risk increases if a parent or sibling has type 2 diabetes.  Race. Although it's unclear why, people of certain races -- including blacks, Hispanics, American Indians and Asian-Americans -- are at higher risk.  Age. Your risk increases as you get older. This may be because you tend to exercise less, lose muscle mass and gain weight as you age. But type 2 diabetes is also increasing dramatically among children, adolescents and younger adults.  Gestational diabetes. If you developed gestational diabetes when you were pregnant, your risk of developing prediabetes and type 2 diabetes later increases. If you gave birth to a baby weighing more than 9 pounds (4 kilograms), you're also at risk of type 2 diabetes.  Polycystic ovary syndrome. For women, having polycystic ovary syndrome -- a common condition characterized by irregular menstrual periods, excess hair growth and obesity -- increases the risk of diabetes.  High blood pressure. Having blood pressure  over 140/90 millimeters of mercury (mm Hg) is linked to an increased risk of type 2 diabetes.  Abnormal cholesterol and triglyceride levels. If you have low levels of high-density lipoprotein (HDL), or "good," cholesterol, your risk of type 2 diabetes is higher. Triglycerides are another type of fat carried in the blood. People with high levels of triglycerides have an increased risk of type 2 diabetes. Your doctor can let you know what your cholesterol and triglyceride levels are.  A good guide to good carbs: The glycemic index ---If you have diabetes, or at risk for diabetes, you know all too well that when you eat carbohydrates, your blood sugar goes up. The total amount of carbs you consume at a meal or in a snack mostly determines what your blood sugar will do. But the food itself also plays a role. A serving of white rice has almost the same effect as eating pure table sugar -- a quick, high spike in blood sugar. A serving of lentils has a slower, smaller effect.  ---Picking good sources of carbs can help you control your blood sugar and your weight. Even if you don't have diabetes, eating healthier carbohydrate-rich foods can help ward off a host of chronic conditions, from heart disease to various cancers to, well, diabetes.  ---One way to choose foods is with the glycemic index (GI). This tool measures how much a food boosts blood sugar.  The glycemic index rates the effect of a specific amount of a food on blood sugar compared with the same amount  of pure glucose. A food with a glycemic index of 28 boosts blood sugar only 28% as much as pure glucose. One with a GI of 95 acts like pure glucose.    High glycemic foods result in a quick spike in insulin and blood sugar (also known as blood glucose).  Low glycemic foods have a slower, smaller effect- these are healthier for you.   Using the glycemic index Using the glycemic index is easy: choose foods in the low GI category instead of those in  the high GI category (see below), and go easy on those in between. Low glycemic index (GI of 55 or less): Most fruits and vegetables, beans, minimally processed grains, pasta, low-fat dairy foods, and nuts.  Moderate glycemic index (GI 56 to 69): White and sweet potatoes, corn, white rice, couscous, breakfast cereals such as Cream of Wheat and Mini Wheats.  High glycemic index (GI of 70 or higher): White bread, rice cakes, most crackers, bagels, cakes, doughnuts, croissants, most packaged breakfast cereals. You can see the values for 100 commons foods and get links to more at www.health.CheapToothpicks.si.  Swaps for lowering glycemic index  Instead of this high-glycemic index food Eat this lower-glycemic index food  White rice Brown rice or converted rice  Instant oatmeal Steel-cut oats  Cornflakes Bran flakes  Baked potato Pasta, bulgur  White bread Whole-grain bread  Corn Peas or leafy greens       Prediabetes Eating Plan  Prediabetes--also called impaired glucose tolerance or impaired fasting glucose--is a condition that causes blood sugar (blood glucose) levels to be higher than normal. Following a healthy diet can help to keep prediabetes under control. It can also help to lower the risk of type 2 diabetes and heart disease, which are increased in people who have prediabetes. Along with regular exercise, a healthy diet:  Promotes weight loss.  Helps to control blood sugar levels.  Helps to improve the way that the body uses insulin.   WHAT DO I NEED TO KNOW ABOUT THIS EATING PLAN?   Use the glycemic index (GI) to plan your meals. The index tells you how quickly a food will raise your blood sugar. Choose low-GI foods. These foods take a longer time to raise blood sugar.  Pay close attention to the amount of carbohydrates in the food that you eat. Carbohydrates increase blood sugar levels.  Keep track of how many calories you take in. Eating the right amount of calories  will help you to achieve a healthy weight. Losing about 7 percent of your starting weight can help to prevent type 2 diabetes.  You may want to follow a Mediterranean diet. This diet includes a lot of vegetables, lean meats or fish, whole grains, fruits, and healthy oils and fats.   WHAT FOODS CAN I EAT?  Grains Whole grains, such as whole-wheat or whole-grain breads, crackers, cereals, and pasta. Unsweetened oatmeal. Bulgur. Barley. Quinoa. Brown rice. Corn or whole-wheat flour tortillas or taco shells. Vegetables Lettuce. Spinach. Peas. Beets. Cauliflower. Cabbage. Broccoli. Carrots. Tomatoes. Squash. Eggplant. Herbs. Peppers. Onions. Cucumbers. Brussels sprouts. Fruits Berries. Bananas. Apples. Oranges. Grapes. Papaya. Mango. Pomegranate. Kiwi. Grapefruit. Cherries. Meats and Other Protein Sources Seafood. Lean meats, such as chicken and [place] or lean cuts of pork and beef. Tofu. Eggs. Nuts. Beans. Dairy Low-fat or fat-free dairy products, such as yogurt, cottage cheese, and cheese. Beverages Water. Tea. Coffee. Sugar-free or diet soda. Seltzer water. Milk. Milk alternatives, such as soy or almond milk. Condiments Mustard. Relish. Low-fat,  low-sugar ketchup. Low-fat, low-sugar barbecue sauce. Low-fat or fat-free mayonnaise. Sweets and Desserts Sugar-free or low-fat pudding. Sugar-free or low-fat ice cream and other frozen treats. Fats and Oils Avocado. Walnuts. Olive oil. The items listed above may not be a complete list of recommended foods or beverages. Contact your dietitian for more options.    WHAT FOODS ARE NOT RECOMMENDED?  Grains Refined white flour and flour products, such as bread, pasta, snack foods, and cereals. Beverages Sweetened drinks, such as sweet iced tea and soda. Sweets and Desserts Baked goods, such as cake, cupcakes, pastries, cookies, and cheesecake. The items listed above may not be a complete list of foods and beverages to avoid. Contact your  dietitian for more information.   This information is not intended to replace advice given to you by your health care provider. Make sure you discuss any questions you have with your health care provider.   Document Released: 06/26/2014 Document Reviewed: 06/26/2014 Elsevier Interactive Patient Education [employer].

## 2018-04-25 ENCOUNTER — Encounter: Payer: Self-pay | Admitting: Family Medicine

## 2018-04-25 ENCOUNTER — Other Ambulatory Visit: Payer: Self-pay

## 2018-04-25 MED ORDER — PEN NEEDLES 31G X 5 MM MISC: 1.0000 | Freq: Every day | 1 refills | Status: DC

## 2018-04-27 ENCOUNTER — Telehealth: Payer: Self-pay

## 2018-04-27 NOTE — Telephone Encounter (Signed)
Spoke to patient she states that the Victoza was to expensive. Patient would like to know if there is an alternative medication or if patient should should continue on the 2500 mg of Metformin daily and exercising.  Please review and advise. MPulliam, CMA/RT(R)

## 2018-04-27 NOTE — Telephone Encounter (Signed)
If she could please call her insurance and see if they cover ozempic/ semaglutide that would be great.   If that is not cost- prohibitive, we will go that route.   thnx

## 2018-04-28 NOTE — Telephone Encounter (Signed)
Sent patient a Therapist, music. MPulliam, CMA/RT(R)

## 2018-05-03 ENCOUNTER — Encounter: Payer: Self-pay | Admitting: Family Medicine

## 2018-05-04 ENCOUNTER — Other Ambulatory Visit: Payer: Self-pay

## 2018-05-04 DIAGNOSIS — E119 Type 2 diabetes mellitus without complications: Secondary | ICD-10-CM

## 2018-05-04 MED ORDER — SEMAGLUTIDE(0.25 OR 0.5MG/DOS) 2 MG/1.5ML ~~LOC~~ SOPN: PEN_INJECTOR | SUBCUTANEOUS | 0 refills | Status: DC

## 2018-05-04 NOTE — Progress Notes (Signed)
Called insurance and spoke to Bennett, Miranda Delacruz is covered by insurance.  Per verbal instructions from Dr. Raliegh Scarlet sent in RX and patient notified. MPulliam, CMA/RT(R)

## 2018-05-18 ENCOUNTER — Encounter: Payer: Self-pay | Admitting: Obstetrics and Gynecology

## 2018-05-20 ENCOUNTER — Encounter: Payer: Self-pay | Admitting: Family Medicine

## 2018-05-24 ENCOUNTER — Other Ambulatory Visit (INDEPENDENT_AMBULATORY_CARE_PROVIDER_SITE_OTHER): Payer: BLUE CROSS/BLUE SHIELD

## 2018-05-24 ENCOUNTER — Other Ambulatory Visit: Payer: Self-pay

## 2018-05-24 ENCOUNTER — Telehealth: Payer: Self-pay

## 2018-05-24 DIAGNOSIS — I10 Essential (primary) hypertension: Secondary | ICD-10-CM

## 2018-05-24 DIAGNOSIS — R809 Proteinuria, unspecified: Secondary | ICD-10-CM

## 2018-05-24 DIAGNOSIS — E1169 Type 2 diabetes mellitus with other specified complication: Secondary | ICD-10-CM

## 2018-05-24 DIAGNOSIS — E781 Pure hyperglyceridemia: Secondary | ICD-10-CM

## 2018-05-24 DIAGNOSIS — E1159 Type 2 diabetes mellitus with other circulatory complications: Secondary | ICD-10-CM

## 2018-05-24 DIAGNOSIS — E782 Mixed hyperlipidemia: Secondary | ICD-10-CM

## 2018-05-24 DIAGNOSIS — E119 Type 2 diabetes mellitus without complications: Secondary | ICD-10-CM

## 2018-05-24 DIAGNOSIS — E1129 Type 2 diabetes mellitus with other diabetic kidney complication: Secondary | ICD-10-CM

## 2018-05-24 NOTE — Telephone Encounter (Signed)
Patient notified. MPulliam, CMA/RT(R)  

## 2018-05-24 NOTE — Telephone Encounter (Signed)
Thank you for letting me know.      Please make sure med list is updated since patient is not on this, we will address in 2 days at her follow-up.  -Please make sure she has a log of her current home fasting blood sugars, 2-hour postprandial blood sugars as well as what her current blood pressure and pulse have been running.  Please make sure you remind her she needs a full set of vitals prior to next OV 1 week call.

## 2018-05-24 NOTE — Telephone Encounter (Signed)
Spoke to the patient she states that she did trying the savings card for the [place] but that she would have to pay $25 upfront and the insurance would cover $150 and then she would be responsible for the remaining which was around $700 dollars and she can not afford that.  Patient patient came in today 05/24/2018 for labs and has a follow up on 05/26/2018.  Please review and advise if changes need to be made now or at her follow up. MPulliam, CMA/RT(R)

## 2018-05-25 LAB — COMPREHENSIVE METABOLIC PANEL
ALT: 98 IU/L — ABNORMAL HIGH (ref 0–32)
AST: 50 IU/L — ABNORMAL HIGH (ref 0–40)
Albumin/Globulin Ratio: 2.3 — ABNORMAL HIGH (ref 1.2–2.2)
Albumin: 4.4 g/dL (ref 3.8–4.9)
Alkaline Phosphatase: 52 IU/L (ref 39–117)
BUN/Creatinine Ratio: 13 (ref 9–23)
BUN: 9 mg/dL (ref 6–24)
Bilirubin Total: 0.6 mg/dL (ref 0.0–1.2)
CO2: 25 mmol/L (ref 20–29)
CREATININE: 0.67 mg/dL (ref 0.57–1.00)
Calcium: 10 mg/dL (ref 8.7–10.2)
Chloride: 100 mmol/L (ref 96–106)
GFR calc Af Amer: 114 mL/min/{1.73_m2} (ref >59)
GFR calc non Af Amer: 99 mL/min/{1.73_m2} (ref >59)
Globulin, Total: 1.9 g/dL (ref 1.5–4.5)
Glucose: 179 mg/dL — ABNORMAL HIGH (ref 65–99)
Potassium: 4.6 mmol/L (ref 3.5–5.2)
Sodium: 143 mmol/L (ref 134–144)
Total Protein: 6.3 g/dL (ref 6.0–8.5)

## 2018-05-25 LAB — VITAMIN B12: Vitamin B-12: 446 pg/mL (ref 232–1245)

## 2018-05-25 LAB — MAGNESIUM: Magnesium: 1.6 mg/dL (ref 1.6–2.3)

## 2018-05-25 LAB — HEMOGLOBIN A1C
Est. average glucose Bld gHb Est-mCnc: 111 mg/dL
HEMOGLOBIN A1C: 5.5 % (ref 4.8–5.6)

## 2018-05-26 ENCOUNTER — Other Ambulatory Visit: Payer: Self-pay

## 2018-05-26 ENCOUNTER — Ambulatory Visit (INDEPENDENT_AMBULATORY_CARE_PROVIDER_SITE_OTHER): Payer: BLUE CROSS/BLUE SHIELD | Admitting: Family Medicine

## 2018-05-26 ENCOUNTER — Encounter: Payer: Self-pay | Admitting: Family Medicine

## 2018-05-26 VITALS — Ht 68.0 in | Wt 264.2 lb

## 2018-05-26 DIAGNOSIS — I152 Hypertension secondary to endocrine disorders: Secondary | ICD-10-CM

## 2018-05-26 DIAGNOSIS — Z5320 Procedure and treatment not carried out because of patient's decision for unspecified reasons: Secondary | ICD-10-CM | POA: Insufficient documentation

## 2018-05-26 DIAGNOSIS — E1159 Type 2 diabetes mellitus with other circulatory complications: Secondary | ICD-10-CM

## 2018-05-26 DIAGNOSIS — E119 Type 2 diabetes mellitus without complications: Secondary | ICD-10-CM

## 2018-05-26 DIAGNOSIS — R748 Abnormal levels of other serum enzymes: Secondary | ICD-10-CM

## 2018-05-26 DIAGNOSIS — Z7189 Other specified counseling: Secondary | ICD-10-CM

## 2018-05-26 DIAGNOSIS — E782 Mixed hyperlipidemia: Secondary | ICD-10-CM

## 2018-05-26 DIAGNOSIS — E559 Vitamin D deficiency, unspecified: Secondary | ICD-10-CM

## 2018-05-26 DIAGNOSIS — I10 Essential (primary) hypertension: Secondary | ICD-10-CM

## 2018-05-26 DIAGNOSIS — E1169 Type 2 diabetes mellitus with other specified complication: Secondary | ICD-10-CM

## 2018-05-26 NOTE — Progress Notes (Signed)
Virtual Visit via Telephone Note for [employer], D.O- Primary Care Physician at [facility]   I connected with current patient today by telephone and verified that I am speaking with the correct person using two identifiers.   Because of federal recommendations of social distancing due to the current novel COVID-19 outbreak, an audio/video telehealth visit is felt to be most appropriate for this patient at this time.  My staff members also discussed with the patient that there may be a patient charge related to this service.   The patient expressed understanding, and agreed to proceed.    History of Present Illness:    DM-  Txmnt per Endo as well as [place].  We tried to start patient on Ozempic recently to help with weight loss as well but this was not approved through insurance-and the medicine was cost prohibitive.  Patient remains on her metformin.  Blood sugars are improving most recently per patient-versus prior however she does not have any specific blood sugars for me.  She has no complaints today.   Chol - done end of Oct. '19--> patient had such got high triglycerides at almost 500 that LDL could not be determined.   HDL was too high.    Patient has declined statins in the past and could not take them recently due to elevated LFTs.   Elevated LFT's/  NASH -   D/c pt that her liver enzymes were significantly up again.  she will need to hold the Lipitor as she is not able to take this medicine with such high ALT and AST levels. Her last abdominal ultrasound was 2016 to evaluate her liver with history of elevated liver enzymes which showed fatty liver infiltrate and I suspect this is the reason again.    Obesity: Patient has not been checking her weight and hence, the CMA put the same exact weight from last office visit into patient's chart today.   Per patient she does not think she is up or down in particular.  Has not been working out.       Wt Readings from Last 3  Encounters:  05/26/18 264 lb 3.2 oz (119.8 kg)  04/22/18 264 lb 3.2 oz (119.8 kg)  12/16/17 271 lb (122.9 kg)    BP Readings from Last 3 Encounters:  04/22/18 (!) 161/84  12/16/17 140/84  02/08/17 (!) 162/86    Pulse Readings from Last 3 Encounters:  04/22/18 65  12/16/17 68  02/08/17 76    BMI Readings from Last 3 Encounters:  05/26/18 40.17 kg/m  04/22/18 40.17 kg/m  12/16/17 41.21 kg/m     -Vitals obtained; Medications, allergies reconciled;  personal medical, social, Sx etc. etc. histories were updated by Lanier Prude the medical assistant today and are reflected in below chart    Patient Care Team    Relationship Specialty Notifications Start End  [name], DO PCP - General Family Medicine  09/24/15   Sheran Fava, MD Referring Physician Endocrinology  10/01/16    Comment: [facility]- [place], [place]-  Txs her thyroid as well her DM now     Patient Active Problem List   Diagnosis Date Noted  . Migraine headache with aura 02/11/2015    Priority: High  . Hyperlipidemia 12/10/2014    Priority: High  . Obesity, Class III, BMI 40-49.9 (morbid obesity) ([place]) 11/21/2014    Priority: High  . HTN (hypertension) 11/21/2014    Priority: High  . Hypothyroidism 11/21/2014  Priority: Medium  . Hx of papillary thyroid cancer 11/21/2014    Priority: Medium  . Counseling on health promotion and disease prevention 07/31/2016    Priority: Low  . Fatty liver disease, nonalcoholic 19/14/7829    Priority: Low  . Elevated liver enzymes- from fatty liver disease. 12/10/2014    Priority: Low  . Vitamin D deficiency 12/10/2014    Priority: Low  . Statin declined 05/26/2018  . Type 2 diabetes mellitus with hypertriglyceridemia ([place]) 04/22/2018  . Type 2 diabetes mellitus without complication, without long-term current use of insulin ([place]) 12/16/2017  . Hypertension associated with diabetes ([place]) 12/16/2017  . Mixed diabetic hyperlipidemia associated  with type 2 diabetes mellitus ([place]) 12/16/2017  . Microalbuminuria due to type 2 diabetes mellitus ([place]) 12/16/2017  . Dysuria 02/08/2017  . Abnormal urinalysis 02/08/2017  . Cervical cancer screening 07/31/2016  . Screening for breast cancer 07/31/2016  . Low serum HDL 10/24/2015  . Menopause present 09/25/2015  . Special screening for malignant neoplasms, colon   . Benign neoplasm of ascending colon   . Benign neoplasm of transverse colon   . Benign neoplasm of sigmoid colon      Current Meds  Medication Sig  . amLODIPine-Valsartan-HCTZ 5-160-12.5 MG TABS Take 1 tablet by mouth daily.  . Chromium 1000 MCG TABS Take 2 tablets by mouth 2 (two) times a week.   Marland Kitchen Co-Enzyme Q10 200 MG CAPS Take 400 mg by mouth daily.   . Insulin Pen Needle (PEN NEEDLES) 31G X 5 MM MISC 1 each by Does not apply route daily.  Marland Kitchen levothyroxine (SYNTHROID, LEVOTHROID) 150 MCG tablet Take 1 tablet by mouth daily.  . magnesium oxide (MAG-OX) 400 MG tablet Take 2 tablets by mouth daily.  . metFORMIN (GLUCOPHAGE) 500 MG tablet 2 tabs q am, 3 tabs q hs  . metoprolol succinate (TOPROL-XL) 100 MG 24 hr tablet 1 tab BID  . milk thistle 175 MG tablet Take 175 mg by mouth daily.  Marland Kitchen omega-3 acid ethyl esters (LOVAZA) 1 g capsule Take 2 capsules (2 g total) by mouth 2 (two) times daily.  . TURMERIC PO Take 2 tablets by mouth daily.  . vitamin B-12 (CYANOCOBALAMIN) 1000 MCG tablet Take 1,000 mcg by mouth daily.  . Vitamin D, Ergocalciferol, (DRISDOL) 50000 units CAPS capsule TAKE 1 CAPSULE BY MOUTH ONCE A WEEK  . zinc gluconate 50 MG tablet Take 1 tablet by mouth daily.     Allergies:  Allergies  Allergen Reactions  . Statins Other (See Comments)    Elevated LFTs     ROS:  See above HPI for pertinent positives and negatives   Objective:   Height 5' 8"  (1.727 m), weight 264 lb 3.2 oz (119.8 kg). General: sounds in no acute distress.  Skin: Pt confirms warm and dry  extremities and pink fingertips  Respiratory: speaking in full sentences, no conversational dyspnea Psych: A and O *3, appears insight good, mood- full      Impression and Recommendations:    - told pt to call back to update med list and ALSO TO LET us KNOW VITALS so this can be updated in the chart   1. Type 2 diabetes mellitus without complication, without long-term current use of insulin (HCC) -A1c 5 months ago was 8.1 and 2 days ago 5.5.  This is excellent control. -   2. Hypertension associated with diabetes ([place]) Uncertain as to what her BP's are running- she is not checking at home despite being told mult  times. - no complaints - cont meds---> on ARB  3. Mixed diabetic hyperlipidemia associated with type 2 diabetes mellitus (HCC) Dietary changes such as low saturated & trans fat and low carb/ ketogenic diets discussed with patient.  Encouraged regular exercise and weight loss when appropriate.  Continue current medication(s).   Also, risks and benefits of medications discussed with patient, including alternative treatments.   Encouraged patient to read drug information handouts to further educate self about the medicine prior to starting it.    4. Low serum HDL exercise  5. Elevated liver enzymes- from fatty liver disease. -  it is extremely important patient engage in intensive dietary and lifestyle changes to control her fatty liver disease. Reminded patient that when she was eating healthy and exercising etc., back on 11/23/2016 her liver enzymes were completely normal. -I recommend we recheck these in 3 to 4 months and if they are not significantly improved, we will need to send her to gastroenterology for further management and work-up   7. Counseling on health promotion and disease prevention - Novel Covid -19 counseling done; all questions were answered.   - Current CDC and federal guidelines reviewed with patient  - Reminded pt of extreme importance of social distancing; minimizing contacts with  others, avoiding ALL but emergency appts etc. - Told patient to be prepared, not scared; and be smart for the sake of others - told to call with any concerns     I discussed the assessment and treatment plan with the patient. The patient was provided an opportunity to ask questions and all were answered.  - The patient agreed with the plan and demonstrated an understanding of the instructions.   No barriers to understanding were identified.  Red flag symptoms and signs discussed in detail.  Patient expressed understanding regarding what to do in case of emergency\urgent symptoms   The patient was advised to call back or seek an in-person evaluation if the symptoms worsen or if the condition fails to improve as anticipated.   Return DM, HLD, TG, vit D Def, elev LFT's etc, for f/up 3-4 mo for reck FLP, A1c, vit D,AST, ALT and OV with me 1 wk later .     Medications Discontinued During This Encounter  Medication Reason  . Collagen 500 MG CAPS Patient Preference  . CRANBERRY ULTRA STRENGTH PO Patient Preference  . Menaquinone-7 (VITAMIN K2 PO) Patient Preference  . Omega-3 Fatty Acids (FISH OIL) 1200 MG CAPS Change in therapy     I provided 22 minutes of non-face-to-face time during this encounter.   [name], DO

## 2018-05-27 ENCOUNTER — Ambulatory Visit: Payer: BLUE CROSS/BLUE SHIELD | Admitting: Family Medicine

## 2018-05-29 ENCOUNTER — Telehealth: Payer: BLUE CROSS/BLUE SHIELD | Admitting: Family

## 2018-05-29 DIAGNOSIS — J019 Acute sinusitis, unspecified: Secondary | ICD-10-CM

## 2018-05-29 MED ORDER — AMOXICILLIN-POT CLAVULANATE 875-125 MG PO TABS: 1.0000 | ORAL_TABLET | Freq: Two times a day (BID) | ORAL | 0 refills | Status: DC

## 2018-05-29 NOTE — Progress Notes (Signed)
We are sorry that you are not feeling well.  Here is how we plan to help!  Based on what you have shared with me it looks like you have sinusitis.  Sinusitis is inflammation and infection in the sinus cavities of the head.  Based on your presentation I believe you most likely have Acute Bacterial Sinusitis.  This is an infection caused by bacteria and is treated with antibiotics. I have prescribed Augmentin 84m/125mg one tablet twice daily with food, for 7 days. You may use an oral decongestant such as Mucinex D or if you have glaucoma or high blood pressure use plain Mucinex. Saline nasal spray help and can safely be used as often as needed for congestion.  If you develop worsening sinus pain, fever or notice severe headache and vision changes, or if symptoms are not better after completion of antibiotic, please schedule an appointment with a health care provider.    Approximately 5 minutes was spent documenting and reviewing patient's chart.   Sinus infections are not as easily transmitted as other respiratory infection, however we still recommend that you avoid close contact with loved ones, especially the very young and elderly.  Remember to wash your hands thoroughly throughout the day as this is the number one way to prevent the spread of infection!  Home Care:  Only take medications as instructed by your medical team.  Complete the entire course of an antibiotic.  Do not take these medications with alcohol.  A steam or ultrasonic humidifier can help congestion.  You can place a towel over your head and breathe in the steam from hot water coming from a faucet.  Avoid close contacts especially the very young and the elderly.  Cover your mouth when you cough or sneeze.  Always remember to wash your hands.  Get Help Right Away If:  You develop worsening fever or sinus pain.  You develop a severe head ache or visual changes.  Your symptoms persist after you have completed your  treatment plan.  Make sure you  Understand these instructions.  Will watch your condition.  Will get help right away if you are not doing well or get worse.  Your e-visit answers were reviewed by a board certified advanced clinical practitioner to complete your personal care plan.  Depending on the condition, your plan could have included both over the counter or prescription medications.  If there is a problem please reply  once you have received a response from your provider.  Your safety is important to [place]  If you have drug allergies check your prescription carefully.    You can use MyChart to ask questions about today's visit, request a non-urgent call back, or ask for a work or school excuse for 24 hours related to this e-Visit. If it has been greater than 24 hours you will need to follow up with your provider, or enter a new e-Visit to address those concerns.  You will get an e-mail in the next two days asking about your experience.  I hope that your e-visit has been valuable and will speed your recovery. Thank you for using e-visits.

## 2018-06-21 ENCOUNTER — Encounter: Payer: Self-pay | Admitting: Obstetrics and Gynecology

## 2018-06-27 ENCOUNTER — Other Ambulatory Visit: Payer: Self-pay | Admitting: Family

## 2018-06-28 ENCOUNTER — Other Ambulatory Visit: Payer: Self-pay | Admitting: Family Medicine

## 2018-06-28 DIAGNOSIS — I152 Hypertension secondary to endocrine disorders: Secondary | ICD-10-CM

## 2018-06-28 DIAGNOSIS — E1159 Type 2 diabetes mellitus with other circulatory complications: Secondary | ICD-10-CM

## 2018-06-28 DIAGNOSIS — I10 Essential (primary) hypertension: Principal | ICD-10-CM

## 2018-07-25 ENCOUNTER — Telehealth: Payer: Self-pay | Admitting: Family Medicine

## 2018-07-25 ENCOUNTER — Other Ambulatory Visit: Payer: Self-pay

## 2018-07-25 DIAGNOSIS — E1159 Type 2 diabetes mellitus with other circulatory complications: Secondary | ICD-10-CM

## 2018-07-25 MED ORDER — METOPROLOL SUCCINATE ER 100 MG PO TB24: ORAL_TABLET | ORAL | 0 refills | Status: DC

## 2018-07-25 NOTE — Telephone Encounter (Signed)
Patient called to say has run out of medication due to a change in dosage taking but not in count of pills prescribed for :  ---- ( Per patient she now take 1 tab in AM & 1 whole tab in PM) metoprolol succinate (TOPROL-XL) 100 MG 24 hr tablet [[id number]]   Order Details  Dose, Route, Frequency: As Directed   Dispense Quantity: 45 tablet Refills: 0 Fills remaining: --        Sig: TAKE 1 TABLET BY MOUTH IN THE MORNING AND 1/2 TAB IN THE EVENING(AROUND 12 HOURS LATER)          --Forwarding message to medical assistant for review w/ provider & a refill order to pharmacy :   [facility] ([address]  - [place] [phone] (Phone) [phone] (Fax)   ---Please call pt at [phone] if any questions.  --glh

## 2018-08-08 ENCOUNTER — Encounter: Payer: Self-pay | Admitting: Obstetrics and Gynecology

## 2018-08-08 ENCOUNTER — Other Ambulatory Visit: Payer: Self-pay

## 2018-08-08 ENCOUNTER — Ambulatory Visit (INDEPENDENT_AMBULATORY_CARE_PROVIDER_SITE_OTHER): Payer: BC Managed Care – PPO | Admitting: Obstetrics and Gynecology

## 2018-08-08 ENCOUNTER — Other Ambulatory Visit (HOSPITAL_COMMUNITY)
Admission: RE | Admit: 2018-08-08 | Discharge: 2018-08-08 | Disposition: A | Discharge status: Home / Self Care | Payer: BC Managed Care – PPO | Source: Ambulatory Visit | Attending: Obstetrics and Gynecology | Admitting: Obstetrics and Gynecology

## 2018-08-08 VITALS — BP 120/80 | Ht 68.5 in | Wt 258.6 lb

## 2018-08-08 DIAGNOSIS — Z124 Encounter for screening for malignant neoplasm of cervix: Secondary | ICD-10-CM

## 2018-08-08 DIAGNOSIS — Z1151 Encounter for screening for human papillomavirus (HPV): Secondary | ICD-10-CM | POA: Diagnosis present

## 2018-08-08 DIAGNOSIS — Z01419 Encounter for gynecological examination (general) (routine) without abnormal findings: Secondary | ICD-10-CM

## 2018-08-08 NOTE — Patient Instructions (Signed)
I value your feedback and entrusting us with your care. If you get a [facility] patient survey, I would appreciate you taking the time to let us know about your experience today. Thank you! 

## 2018-08-08 NOTE — Progress Notes (Signed)
[name], DO   Chief Complaint  Patient presents with  . Repeat pap    HPI:      Miranda Delacruz is a 58 y.o. [id number] who LMP was No LMP recorded. Patient is postmenopausal., presents today for NP pap smear.  Last pap 06/12/14 Neg pap, no HPV done, endometrial cells present in woman > 8 year old. No EMB done. No other hx of abn paps. Pt gets annuals with PCP. Pt is postmenopausal since 2014. No PMB, no vasomotor sx.  No FH breast/ovar/colon cancer. Pt current on colonoscopy.  Pt is not sex active, no vag dryness.  Current on mammo.  Past Medical History:  Diagnosis Date  . Cancer ([place])   . Hyperlipidemia   . Hypertension   . Thyroid disease     Past Surgical History:  Procedure Laterality Date  . COLONOSCOPY  2000   normal  . COLONOSCOPY WITH PROPOFOL N/A 01/22/2015   Procedure: COLONOSCOPY WITH PROPOFOL;  Surgeon: Lucilla Lame, MD;  Location: [facility] ENDOSCOPY;  Service: Endoscopy;  Laterality: N/A;  . THYROIDECTOMY    . TONSILLECTOMY    . TUBAL LIGATION      Family History  Problem Relation Age of Onset  . Hypertension Mother   . Hypertension Maternal Grandmother   . Stroke Maternal Grandmother     Social History   Socioeconomic History  . Marital status: Married    Spouse name: Not on file  . Number of children: Not on file  . Years of education: Not on file  . Highest education level: Not on file  Occupational History  . Not on file  Social Needs  . Financial resource strain: Not on file  . Food insecurity    Worry: Not on file    Inability: Not on file  . Transportation needs    Medical: Not on file    Non-medical: Not on file  Tobacco Use  . Smoking status: Former Smoker    Quit date: 02/24/1987    Years since quitting: 31.4  . Smokeless tobacco: Never Used  Substance and Sexual Activity  . Alcohol use: Yes    Alcohol/week: 0.0 standard drinks  . Drug use: No  . Sexual activity: Yes    Birth control/protection: Post-menopausal   Lifestyle  . Physical activity    Days per week: Not on file    Minutes per session: Not on file  . Stress: Not on file  Relationships  . Social Herbalist on phone: Not on file    Gets together: Not on file    Attends religious service: Not on file    Active member of club or organization: Not on file    Attends meetings of clubs or organizations: Not on file    Relationship status: Not on file  . Intimate partner violence    Fear of current or ex partner: Not on file    Emotionally abused: Not on file    Physically abused: Not on file    Forced sexual activity: Not on file  Other Topics Concern  . Not on file  Social History Narrative  . Not on file    Outpatient Medications Prior to Visit  Medication Sig Dispense Refill  . amLODIPine-Valsartan-HCTZ 5-160-12.5 MG TABS Take 1 tablet by mouth daily. 90 tablet 1  . Chromium 1000 MCG TABS Take 2 tablets by mouth 2 (two) times a week.     Marland Kitchen Co-Enzyme Q10 200 MG CAPS Take  400 mg by mouth daily.     Marland Kitchen levothyroxine (SYNTHROID, LEVOTHROID) 150 MCG tablet Take 1 tablet by mouth daily.    . magnesium oxide (MAG-OX) 400 MG tablet Take 2 tablets by mouth daily.    . metFORMIN (GLUCOPHAGE) 500 MG tablet 2 tabs q am, 3 tabs q hs 450 tablet 1  . metoprolol succinate (TOPROL-XL) 100 MG 24 hr tablet 1 tab BID 180 tablet 0  . milk thistle 175 MG tablet Take 175 mg by mouth daily.    Marland Kitchen omega-3 acid ethyl esters (LOVAZA) 1 g capsule Take 2 capsules (2 g total) by mouth 2 (two) times daily. 360 capsule 1  . TURMERIC PO Take 2 tablets by mouth daily.    . vitamin B-12 (CYANOCOBALAMIN) 1000 MCG tablet Take 1,000 mcg by mouth daily.    . Vitamin D, Ergocalciferol, (DRISDOL) 50000 units CAPS capsule TAKE 1 CAPSULE BY MOUTH ONCE A WEEK 12 capsule 10  . zinc gluconate 50 MG tablet Take 1 tablet by mouth daily.    Marland Kitchen amoxicillin-clavulanate (AUGMENTIN) 875-125 MG tablet Take 1 tablet by mouth 2 (two) times daily. 14 tablet 0  . Insulin Pen  Needle (PEN NEEDLES) 31G X 5 MM MISC 1 each by Does not apply route daily. 100 each 1   No facility-administered medications prior to visit.       ROS:  Review of Systems  Constitutional: Negative for fatigue, fever and unexpected weight change.  Respiratory: Negative for cough, shortness of breath and wheezing.   Cardiovascular: Negative for chest pain, palpitations and leg swelling.  Gastrointestinal: Negative for blood in stool, constipation, diarrhea, nausea and vomiting.  Endocrine: Negative for cold intolerance, heat intolerance and polyuria.  Genitourinary: Negative for dyspareunia, dysuria, flank pain, frequency, genital sores, hematuria, menstrual problem, pelvic pain, urgency, vaginal bleeding, vaginal discharge and vaginal pain.  Musculoskeletal: Negative for back pain, joint swelling and myalgias.  Skin: Negative for rash.  Neurological: Negative for dizziness, syncope, light-headedness, numbness and headaches.  Hematological: Negative for adenopathy.  Psychiatric/Behavioral: Negative for agitation, confusion, sleep disturbance and suicidal ideas. The patient is not nervous/anxious.    BREAST: No symptoms   OBJECTIVE:   Vitals:  BP 120/80   Ht 5' 8.5" (1.74 m)   Wt 258 lb 9.6 oz (117.3 kg)   BMI 38.75 kg/m   Physical Exam Vitals signs reviewed.  Constitutional:      Appearance: She is well-developed.  Neck:     Musculoskeletal: Normal range of motion.  Pulmonary:     Effort: Pulmonary effort is normal.  Genitourinary:    General: Normal vulva.     Pubic Area: No rash.      Labia:        Right: No rash, tenderness or lesion.        Left: No rash, tenderness or lesion.      Vagina: Normal. No vaginal discharge, erythema or tenderness.     Cervix: Normal.     Uterus: Normal. Not enlarged and not tender.      Adnexa: Right adnexa normal and left adnexa normal.       Right: No mass or tenderness.         Left: No mass or tenderness.    Musculoskeletal:  Normal range of motion.  Skin:    General: Skin is warm and dry.  Neurological:     General: No focal deficit present.     Mental Status: She is alert and oriented to person, place, and  time.  Psychiatric:        Mood and Affect: Mood normal.        Behavior: Behavior normal.        Thought Content: Thought content normal.        Judgment: Judgment normal.     Assessment/Plan: Cervical cancer screening - Plan: Cytology - PAP,   Screening for HPV (human papillomavirus) - Plan: Cytology - PAP,   Normal gynecologic examination - Plan:   Hx of endometrial cells on pap smear 2016--pt needs EMB if still present    F/u prn   B. , PA-C 08/08/2018 10:48 AM

## 2018-08-10 LAB — CYTOLOGY - PAP
Diagnosis: NEGATIVE
HPV: NOT DETECTED

## 2018-09-26 ENCOUNTER — Other Ambulatory Visit (INDEPENDENT_AMBULATORY_CARE_PROVIDER_SITE_OTHER): Payer: BC Managed Care – PPO

## 2018-09-26 ENCOUNTER — Other Ambulatory Visit: Payer: Self-pay

## 2018-09-26 DIAGNOSIS — E1159 Type 2 diabetes mellitus with other circulatory complications: Secondary | ICD-10-CM

## 2018-09-26 DIAGNOSIS — E781 Pure hyperglyceridemia: Secondary | ICD-10-CM

## 2018-09-26 DIAGNOSIS — E1169 Type 2 diabetes mellitus with other specified complication: Secondary | ICD-10-CM

## 2018-09-26 DIAGNOSIS — K760 Fatty (change of) liver, not elsewhere classified: Secondary | ICD-10-CM

## 2018-09-26 DIAGNOSIS — E782 Mixed hyperlipidemia: Secondary | ICD-10-CM

## 2018-09-26 DIAGNOSIS — E559 Vitamin D deficiency, unspecified: Secondary | ICD-10-CM

## 2018-09-26 DIAGNOSIS — E890 Postprocedural hypothyroidism: Secondary | ICD-10-CM

## 2018-09-26 DIAGNOSIS — I10 Essential (primary) hypertension: Secondary | ICD-10-CM

## 2018-09-26 DIAGNOSIS — E119 Type 2 diabetes mellitus without complications: Secondary | ICD-10-CM

## 2018-09-26 DIAGNOSIS — I152 Hypertension secondary to endocrine disorders: Secondary | ICD-10-CM

## 2018-09-27 LAB — LIPID PANEL
Chol/HDL Ratio: 6.4 ratio — ABNORMAL HIGH (ref 0.0–4.4)
Cholesterol, Total: 224 mg/dL — ABNORMAL HIGH (ref 100–199)
HDL: 35 mg/dL — ABNORMAL LOW (ref >39)
LDL Calculated: 132 mg/dL — ABNORMAL HIGH (ref 0–99)
Triglycerides: 285 mg/dL — ABNORMAL HIGH (ref 0–149)
VLDL Cholesterol Cal: 57 mg/dL — ABNORMAL HIGH (ref 5–40)

## 2018-09-27 LAB — ALT: ALT: 94 IU/L — ABNORMAL HIGH (ref 0–32)

## 2018-09-27 LAB — HEMOGLOBIN A1C
Est. average glucose Bld gHb Est-mCnc: 111 mg/dL
Hgb A1c MFr Bld: 5.5 % (ref 4.8–5.6)

## 2018-09-27 LAB — VITAMIN D 25 HYDROXY (VIT D DEFICIENCY, FRACTURES): Vit D, 25-Hydroxy: 44.3 ng/mL (ref 30.0–100.0)

## 2018-09-27 LAB — AST: AST: 67 IU/L — ABNORMAL HIGH (ref 0–40)

## 2018-09-29 ENCOUNTER — Ambulatory Visit (INDEPENDENT_AMBULATORY_CARE_PROVIDER_SITE_OTHER): Payer: BC Managed Care – PPO | Admitting: Family Medicine

## 2018-09-29 ENCOUNTER — Other Ambulatory Visit: Payer: Self-pay

## 2018-09-29 ENCOUNTER — Encounter: Payer: Self-pay | Admitting: Family Medicine

## 2018-09-29 VITALS — BP 135/74 | HR 63 | Temp 97.7°F | Ht 68.5 in | Wt 251.6 lb

## 2018-09-29 DIAGNOSIS — Z85850 Personal history of malignant neoplasm of thyroid: Secondary | ICD-10-CM

## 2018-09-29 DIAGNOSIS — R748 Abnormal levels of other serum enzymes: Secondary | ICD-10-CM

## 2018-09-29 DIAGNOSIS — E782 Mixed hyperlipidemia: Secondary | ICD-10-CM

## 2018-09-29 DIAGNOSIS — Z5320 Procedure and treatment not carried out because of patient's decision for unspecified reasons: Secondary | ICD-10-CM | POA: Diagnosis not present

## 2018-09-29 DIAGNOSIS — E119 Type 2 diabetes mellitus without complications: Secondary | ICD-10-CM

## 2018-09-29 DIAGNOSIS — E66813 Obesity, class 3: Secondary | ICD-10-CM

## 2018-09-29 DIAGNOSIS — K760 Fatty (change of) liver, not elsewhere classified: Secondary | ICD-10-CM

## 2018-09-29 DIAGNOSIS — E1169 Type 2 diabetes mellitus with other specified complication: Secondary | ICD-10-CM

## 2018-09-29 DIAGNOSIS — E559 Vitamin D deficiency, unspecified: Secondary | ICD-10-CM

## 2018-09-29 DIAGNOSIS — I10 Essential (primary) hypertension: Secondary | ICD-10-CM

## 2018-09-29 DIAGNOSIS — E781 Pure hyperglyceridemia: Secondary | ICD-10-CM

## 2018-09-29 DIAGNOSIS — I152 Hypertension secondary to endocrine disorders: Secondary | ICD-10-CM

## 2018-09-29 DIAGNOSIS — E1159 Type 2 diabetes mellitus with other circulatory complications: Secondary | ICD-10-CM | POA: Diagnosis not present

## 2018-09-29 DIAGNOSIS — Z789 Other specified health status: Secondary | ICD-10-CM

## 2018-09-29 MED ORDER — METFORMIN HCL 500 MG PO TABS: ORAL_TABLET | ORAL | 0 refills | Status: DC

## 2018-09-29 NOTE — Progress Notes (Signed)
Telehealth office visit note for Miranda Delacruz, D.O- at Primary Care at [facility]   I connected with current patient today and verified that I am speaking with the correct person using two identifiers.   . Location of the patient: Home . Location of the provider: Office Only the patient (+/- their family members at pt's discretion) and myself were participating in the encounter    - This visit type was conducted due to national recommendations for restrictions regarding the COVID-19 Pandemic (e.g. social distancing) in an effort to limit this patient's exposure and mitigate transmission in our community.  This format is felt to be most appropriate for this patient at this time.   - The patient did not have access to video technology or had technical difficulties with video requiring transitioning to audio format only. - No physical exam could be performed with this format, beyond that communicated to [place] by the patient/ family members as noted.   - Additionally my office staff/ schedulers discussed with the patient that there may be a monetary charge related to this service, depending on their medical insurance.   The patient expressed understanding, and agreed to proceed.       History of Present Illness:  States feeling awesome.  Female Health Went to [facility], Dr. Lorelei Pont, and had pap smear done.  Vitamin D Deficiency Has not been taking her weekly Vitamin D; says she's just been "getting out into the sun."  She is taking OTC D3 instead of D2.  Says "the sun really affects your vitamin D production and people don't realize that."   History of Thyroid Cancer Pt confirms she Has not been obtaining diabetes management through endocrinology. Only sees endocrinology for follow-up with her thyroid.   DM HPI: -  She has been working on diet and exercise for diabetes.  Mostly working on her diet and watching what she eats.  "The last two weeks, I changed completely  back to veganism, and it's been a strange roller coaster."  Says she's had a lot of intestinal issues due to all the fiber and is adjusting to this.  She thinks this has helped so far in general, though.  "Toward the middle of the last time I saw you, I went low fat instead of the high-fat keto crap."  Says keto was grossing her out and she just couldn't do it anymore.  "Back to normal instead of wacky things everybody else thinks will help."  Medication compliance - continues on metformin.  Says "I'm surprised I'm still at 5.5 because my sugars run all over the place."  She's been monitoring more than just once per day because she wants to know how food affects her body, especially going back to vegan and eating starchy foods.  Says that her readings were high for a while but now she's surprised how low her triglycerides have gone, down to "almost half of what they were before."  Home fasting glucose readings not reported;  2 hr PP: not checking   Denies polyuria/polydipsia.  Denies hypo/ hyperglycemia symptoms  Last diabetic eye exam was No results found for: [id number]  Last A1C in the office was:  Lab Results  Component Value Date   HGBA1C 5.5 09/26/2018   HGBA1C 5.5 05/24/2018   HGBA1C 5.2 04/22/2018    Lab Results  Component Value Date   MICROALBUR 80 12/16/2017   LDLCALC 132 (H) 09/26/2018   CREATININE 0.67 05/24/2018    Wt  Readings from Last 3 Encounters:  09/29/18 251 lb 9.6 oz (114.1 kg)  08/08/18 258 lb 9.6 oz (117.3 kg)  05/26/18 264 lb 3.2 oz (119.8 kg)    BP Readings from Last 3 Encounters:  09/29/18 135/74  08/08/18 120/80  04/22/18 (!) 161/84    1. 58 y.o. female here for cholesterol follow-up.   - Patient reports good compliance with medications or treatment plan  Patient stopped taking Lovaza twice daily; notes "I don't know how that's going to change the next time we do blood work."   - Smoking Status noted   - She denies new onset of: chest  pain, exercise intolerance, shortness of breath, dizziness, visual changes, headache, lower extremity swelling or claudication.   The cholesterol last visit was:  Lab Results  Component Value Date   CHOL 224 (H) 09/26/2018   HDL 35 (L) 09/26/2018   LDLCALC 132 (H) 09/26/2018   TRIG 285 (H) 09/26/2018   CHOLHDL 6.4 (H) 09/26/2018    Hepatic Function Latest Ref Rng & Units 09/26/2018 05/24/2018 01/28/2018  Total Protein 6.0 - 8.5 g/dL - 6.3 6.6  Albumin 3.8 - 4.9 g/dL - 4.4 4.6  AST 0 - 40 IU/L 67(H) 50(H) 67(H)  ALT 0 - 32 IU/L 94(H) 98(H) 115(H)  Alk Phosphatase 39 - 117 IU/L - 52 67  Total Bilirubin 0.0 - 1.2 mg/dL - 0.6 0.7    2. HTN HPI:  -  Her blood pressure has been controlled at home.  Pt is checking it at home.   Says her blood pressure has been "awesome" at home, 122/74.  - Patient reports good compliance with blood pressure medications  - Denies medication S-E   - Smoking Status noted   - She denies new onset of: chest pain, exercise intolerance, shortness of breath, dizziness, visual changes, headache, lower extremity swelling or claudication.    Last 3 blood pressure readings in our office are as follows: BP Readings from Last 3 Encounters:  09/29/18 135/74  08/08/18 120/80  04/22/18 (!) 161/84    Filed Weights   09/29/18 1004  Weight: 251 lb 9.6 oz (114.1 kg)        Impression and Recommendations:    1. Type 2 diabetes mellitus without complication, without long-term current use of insulin ([place])   2. Hypertension associated with diabetes ([place])   3. Mixed diabetic hyperlipidemia associated with type 2 diabetes mellitus ([facility])   4. Statin declined   5. Type 2 diabetes mellitus with hypertriglyceridemia ([facility])   6. Fatty liver disease, nonalcoholic   7. Elevated liver enzymes- from fatty liver disease.   8. Obesity, Class III, BMI 40-49.9 (morbid obesity) ([place])   9. Consumes a vegan diet- currently, for now   10. Vitamin D deficiency   11. Hx of  papillary thyroid cancer      Endocrinology follow-up - History of Papillary Thyroid Cancer - Patient knows to let us know which endocrinologist she is following up with. - We will provide a referral to patient PRN if she desires. - Patient declines referral at this time.  - Patient only currently sees endo for thyroid cancer follow-up. - Patient has not been obtaining diabetes management through endocrinology.  Diabetes Mellitus - A1c stable at 5.5. - Discussed that last check, patient's kidney function was WNL on metformin.  - Reduce dose of metformin to 1 in the morning and 1 at night.  See med list. - Patient tolerating meds well without complication.  Denies S-E.  -  Counseled patient on pathophysiology of disease and discussed various treatment options, which often includes dietary and lifestyle modifications as first line.  Importance of low carb diet discussed with patient in addition to regular exercise.   - Check FBS and 2 hours after the biggest meal of your day.  Keep log and bring in next OV for my review.   Also, if you ever feel poorly, please check your blood pressure and blood sugar, as one or the other could be the cause of your symptoms.  - Being a diabetic, you need yearly eye and foot exams. Make appt.for diabetic eye exam.   Mixed Diabetic Hyperlipidemia, Hypertriglyceridemia - Triglycerides down to 285 from 298 prior. - LDL = 132, elevated - HDL = 35, low  - Elevated Liver Enzymes; Fatty Liver Infiltrate - AST up to 67 from 50 last measurement. - ALT at 94 from 98 last check. - Discussed that given patient's continued elevated liver enzymes, use of statins would be unwise at this moment in time. - Patient continues to decline statins.  - Discussed other options for treatment with patient today.  - Patient states she would like to take a little while and see how her vegan diet "evens out those numbers."  Patient started a vegan diet recently and wishes to  continue, re-check cholesterol in 4 months.  - Additionally we will re-check patient's liver enzymes along with A1c.  Hypertension Associated with DM - BP stable at this time. - Continue treatment plan as recommended.  See med list below. - Patient tolerating meds well without complication.  Denies S-E  - Ambulatory BP monitoring encouraged. Keep log and bring in next OV  Vitamin D Deficiency - Up to 44.3 from 28.6 prior. - Patient not currently taking supplementation, preferring to go outside. - Encouraged supplementation in winter months. - Will continue to monitor.  Health Counseling & Education - Advised patient to continue working toward exercising to improve overall mental, physical, and emotional health.    American Heart Association guidelines for healthy diet, basically Mediterranean diet, and exercise guidelines of 30 minutes 5 days per week or more discussed in detail.  - Healthy dietary habits encouraged, including low-carb, and high amounts of lean protein in diet.   - Patient should also consume adequate amounts of water.  Health counseling performed.  All questions answered.   - As part of my medical decision making, I reviewed the following data within the [place] History obtained from pt /family, CMA notes reviewed and incorporated if applicable, Labs reviewed, Radiograph/ tests reviewed if applicable and OV notes from prior OV's with me, as well as other specialists she/he has seen since seeing me last, were all reviewed and used in my medical decision making process today.   - Additionally, discussion had with patient regarding txmnt plan, and their biases/concerns about that plan were used in my medical decision making today.   - The patient agreed with the plan and demonstrated an understanding of the instructions.   No barriers to understanding were identified.   - Red flag symptoms and signs discussed in detail.  Patient expressed  understanding regarding what to do in case of emergency\ urgent symptoms.  The patient was advised to call back or seek an in-person evaluation if the symptoms worsen or if the condition fails to improve as anticipated.   Return for A1c, alt, ast, FLP, urine microalb/crt ratio 3d prior to appt in 83mo    Meds ordered this encounter  Medications  . metFORMIN (GLUCOPHAGE) 500 MG tablet    Sig: 1 tab q 12 hrs    Dispense:  180 tablet    Refill:  0    Medications Discontinued During This Encounter  Medication Reason  . Vitamin D, Ergocalciferol, (DRISDOL) 50000 units CAPS capsule Patient Preference  . metFORMIN (GLUCOPHAGE) 500 MG tablet       I provided 26+ minutes of non-face-to-face time during this encounter,with over 50% of the time in direct counseling on patients medical conditions/ medical concerns.  Additional time was spent with charting and coordination of care after the actual visit commenced.   Note:  This note was prepared with assistance of Dragon voice recognition software. Occasional wrong-word or sound-a-like substitutions may have occurred due to the inherent limitations of voice recognition software.  This document serves as a record of services personally performed by Miranda Dance, DO. It was created on her behalf by Toni Amend, a trained medical scribe. The creation of this record is based on the scribe's personal observations and the provider's statements to them.   I have reviewed the above medical documentation for accuracy and completeness and I concur.  Miranda Dance, DO 09/30/2018 3:55 PM        Patient Care Team    Relationship Specialty Notifications Start End  Miranda Dance, DO PCP - General Family Medicine  [birth year]   Amalia Greenhouse Referring Physician Obstetrics and Gynecology  09/29/18   Lonia Farber, MD Consulting Physician Internal Medicine  09/29/18    Comment: Sees for history of thyroid cancer only-not diabetes      -Vitals obtained; medications/ allergies reconciled;  personal medical, social, Sx etc.histories were updated by CMA, reviewed by me and are reflected in chart   Patient Active Problem List   Diagnosis Date Noted  . Migraine headache with aura 02/11/2015    Priority: High  . Hyperlipidemia 12/10/2014    Priority: High  . Obesity, Class III, BMI 40-49.9 (morbid obesity) ([place]) 11/21/2014    Priority: High  . HTN (hypertension) 11/21/2014    Priority: High  . Hypothyroidism 11/21/2014    Priority: Medium  . Hx of papillary thyroid cancer 11/21/2014    Priority: Medium  . Counseling on health promotion and disease prevention 07/31/2016    Priority: Low  . Fatty liver disease, nonalcoholic 50/35/4656    Priority: Low  . Elevated liver enzymes- from fatty liver disease. 12/10/2014    Priority: Low  . Vitamin D deficiency 12/10/2014    Priority: Low  . Consumes a vegan diet- currently, for now 09/29/2018  . Statin declined 05/26/2018  . Type 2 diabetes mellitus with hypertriglyceridemia ([place]) 04/22/2018  . Type 2 diabetes mellitus without complication, without long-term current use of insulin ([place]) 12/16/2017  . Hypertension associated with diabetes ([place]) 12/16/2017  . Mixed diabetic hyperlipidemia associated with type 2 diabetes mellitus ([place]) 12/16/2017  . Microalbuminuria due to type 2 diabetes mellitus ([place]) 12/16/2017  . Dysuria 02/08/2017  . Abnormal urinalysis 02/08/2017  . Cervical cancer screening 07/31/2016  . Screening for breast cancer 07/31/2016  . Low serum HDL 10/24/2015  . Menopause present 09/25/2015  . Special screening for malignant neoplasms, colon   . Benign neoplasm of ascending colon   . Benign neoplasm of transverse colon   . Benign neoplasm of sigmoid colon      Current Meds  Medication Sig  . Chromium 1000 MCG TABS Take 2 tablets by mouth 2 (two) times a week.   Marland Kitchen  Co-Enzyme Q10 200 MG CAPS Take 400 mg by mouth daily.   Marland Kitchen levothyroxine (SYNTHROID,  LEVOTHROID) 150 MCG tablet Take 1 tablet by mouth daily.  . magnesium oxide (MAG-OX) 400 MG tablet Take 2 tablets by mouth daily.  . metFORMIN (GLUCOPHAGE) 500 MG tablet 1 tab q 12 hrs  . metoprolol succinate (TOPROL-XL) 100 MG 24 hr tablet 1 tab BID  . milk thistle 175 MG tablet Take 175 mg by mouth daily.  . TURMERIC PO Take 2 tablets by mouth daily.  . vitamin B-12 (CYANOCOBALAMIN) 1000 MCG tablet Take 1,000 mcg by mouth daily.  Marland Kitchen zinc gluconate 50 MG tablet Take 1 tablet by mouth daily.  . [DISCONTINUED] amLODIPine-Valsartan-HCTZ 5-160-12.5 MG TABS Take 1 tablet by mouth daily.  . [DISCONTINUED] metFORMIN (GLUCOPHAGE) 500 MG tablet 2 tabs q am, 3 tabs q hs     Allergies:  No Known Allergies   ROS:  See above HPI for pertinent positives and negatives   Objective:   Blood pressure 135/74, pulse 63, temperature 97.7 F (36.5 C), height 5' 8.5" (1.74 m), weight 251 lb 9.6 oz (114.1 kg).  (if some vitals are omitted, this means that patient was UNABLE to obtain them even though they were asked to get them prior to OV today.  They were asked to call us at their earliest convenience with these once obtained. )  General: A & O * 3; sounds in no acute distress; in usual state of health.  Skin: Pt confirms warm and dry extremities and pink fingertips HEENT: Pt confirms lips non-cyanotic Chest: Patient confirms normal chest excursion and movement Respiratory: speaking in full sentences, no conversational dyspnea; patient confirms no use of accessory muscles Psych: insight appears good, mood- appears full

## 2018-10-11 ENCOUNTER — Encounter: Payer: Self-pay | Admitting: Family Medicine

## 2018-10-21 ENCOUNTER — Other Ambulatory Visit: Payer: Self-pay | Admitting: Family Medicine

## 2018-10-21 DIAGNOSIS — E1159 Type 2 diabetes mellitus with other circulatory complications: Secondary | ICD-10-CM

## 2018-11-30 ENCOUNTER — Other Ambulatory Visit: Payer: Self-pay | Admitting: Family Medicine

## 2018-11-30 DIAGNOSIS — E119 Type 2 diabetes mellitus without complications: Secondary | ICD-10-CM

## 2019-01-11 ENCOUNTER — Other Ambulatory Visit: Payer: Self-pay | Admitting: Family Medicine

## 2019-01-11 DIAGNOSIS — E1169 Type 2 diabetes mellitus with other specified complication: Secondary | ICD-10-CM

## 2019-01-16 ENCOUNTER — Other Ambulatory Visit: Payer: Self-pay | Admitting: Family Medicine

## 2019-01-16 DIAGNOSIS — I152 Hypertension secondary to endocrine disorders: Secondary | ICD-10-CM

## 2019-01-16 DIAGNOSIS — E1159 Type 2 diabetes mellitus with other circulatory complications: Secondary | ICD-10-CM

## 2019-01-20 ENCOUNTER — Other Ambulatory Visit: Payer: Self-pay | Admitting: Family Medicine

## 2019-01-20 DIAGNOSIS — E119 Type 2 diabetes mellitus without complications: Secondary | ICD-10-CM

## 2019-01-23 ENCOUNTER — Other Ambulatory Visit: Payer: BC Managed Care – PPO

## 2019-01-23 ENCOUNTER — Other Ambulatory Visit: Payer: Self-pay

## 2019-01-23 DIAGNOSIS — E039 Hypothyroidism, unspecified: Secondary | ICD-10-CM

## 2019-01-23 DIAGNOSIS — E785 Hyperlipidemia, unspecified: Secondary | ICD-10-CM

## 2019-01-23 DIAGNOSIS — I10 Essential (primary) hypertension: Secondary | ICD-10-CM

## 2019-01-23 DIAGNOSIS — R748 Abnormal levels of other serum enzymes: Secondary | ICD-10-CM

## 2019-01-23 DIAGNOSIS — E119 Type 2 diabetes mellitus without complications: Secondary | ICD-10-CM

## 2019-01-24 LAB — LIPID PANEL
Chol/HDL Ratio: 6.7 ratio — ABNORMAL HIGH (ref 0.0–4.4)
Cholesterol, Total: 274 mg/dL — ABNORMAL HIGH (ref 100–199)
HDL: 41 mg/dL (ref >39)
LDL Chol Calc (NIH): 190 mg/dL — ABNORMAL HIGH (ref 0–99)
Triglycerides: 226 mg/dL — ABNORMAL HIGH (ref 0–149)
VLDL Cholesterol Cal: 43 mg/dL — ABNORMAL HIGH (ref 5–40)

## 2019-01-24 LAB — MICROALBUMIN / CREATININE URINE RATIO
Creatinine, Urine: 32 mg/dL
Microalb/Creat Ratio: <9 mg/g creat (ref 0–29)
Microalbumin, Urine: <3 ug/mL

## 2019-01-24 LAB — HM DIABETES EYE EXAM

## 2019-01-24 LAB — ALT: ALT: 91 IU/L — ABNORMAL HIGH (ref 0–32)

## 2019-01-24 LAB — HEMOGLOBIN A1C
Est. average glucose Bld gHb Est-mCnc: 111 mg/dL
Hgb A1c MFr Bld: 5.5 % (ref 4.8–5.6)

## 2019-01-24 LAB — AST: AST: 43 IU/L — ABNORMAL HIGH (ref 0–40)

## 2019-01-27 ENCOUNTER — Encounter: Payer: Self-pay | Admitting: Family Medicine

## 2019-01-27 ENCOUNTER — Other Ambulatory Visit: Payer: Self-pay

## 2019-01-27 ENCOUNTER — Ambulatory Visit (INDEPENDENT_AMBULATORY_CARE_PROVIDER_SITE_OTHER): Payer: BC Managed Care – PPO | Admitting: Family Medicine

## 2019-01-27 VITALS — BP 125/75 | Ht 68.5 in | Wt 250.0 lb

## 2019-01-27 DIAGNOSIS — E1159 Type 2 diabetes mellitus with other circulatory complications: Secondary | ICD-10-CM

## 2019-01-27 DIAGNOSIS — Z5320 Procedure and treatment not carried out because of patient's decision for unspecified reasons: Secondary | ICD-10-CM

## 2019-01-27 DIAGNOSIS — R748 Abnormal levels of other serum enzymes: Secondary | ICD-10-CM | POA: Diagnosis not present

## 2019-01-27 DIAGNOSIS — E559 Vitamin D deficiency, unspecified: Secondary | ICD-10-CM

## 2019-01-27 DIAGNOSIS — E1169 Type 2 diabetes mellitus with other specified complication: Secondary | ICD-10-CM | POA: Diagnosis not present

## 2019-01-27 DIAGNOSIS — E66813 Obesity, class 3: Secondary | ICD-10-CM

## 2019-01-27 DIAGNOSIS — K760 Fatty (change of) liver, not elsewhere classified: Secondary | ICD-10-CM

## 2019-01-27 DIAGNOSIS — I152 Hypertension secondary to endocrine disorders: Secondary | ICD-10-CM

## 2019-01-27 DIAGNOSIS — E039 Hypothyroidism, unspecified: Secondary | ICD-10-CM

## 2019-01-27 DIAGNOSIS — I10 Essential (primary) hypertension: Secondary | ICD-10-CM

## 2019-01-27 DIAGNOSIS — E782 Mixed hyperlipidemia: Secondary | ICD-10-CM

## 2019-01-27 NOTE — Progress Notes (Signed)
Telehealth office visit note for Miranda Delacruz, D.O- at Primary Care at [facility]   I connected with current patient today and verified that I am speaking with the correct person using two identifiers.   . Location of the patient: Home . Location of the provider: Office Only the patient (+/- their family members at pt's discretion) and myself were participating in the encounter - This visit type was conducted due to national recommendations for restrictions regarding the COVID-19 Pandemic (e.g. social distancing) in an effort to limit this patient's exposure and mitigate transmission in our community.  This format is felt to be most appropriate for this patient at this time.   - The patient did not have access to video technology or had technical difficulties with video requiring transitioning to audio format only. - No physical exam could be performed with this format, beyond that communicated to [place] by the patient/ family members as noted.   - Additionally my office staff/ schedulers discussed with the patient that there may be a monetary charge related to this service, depending on their medical insurance.   The patient expressed understanding, and agreed to proceed.      History of Present Illness: I, Toni Amend, am serving as scribe for Dr. Mellody Delacruz.  Says she's fantastic today.  - Mood/Lifestyle During COVID Says she's "just over it now."  Says "I'm done with the closings, the shut downs, the lies the fake media is telling everyone."  Notes she is wearing a mask; "I'm behaving, I'm being a sheeple."  HPI:   Diabetes Mellitus:  Home glucose readings:  Notes her meter is "so off sometimes, it's ridiculous."  Says "it's 20 points off like, seconds later, when I just took it before."  Notes she thinks the continuous glucose monitors wouldn't work for her.   - Patient reports good compliance with therapy plan: medication and/or lifestyle modification.  Says she  hates exercising, but has been watching her food intake.  Says she is surprised by her A1c today because she was coming off of vegan/vegetarian diet and "everything is carb-related."  Says the protein she has to intake is "so much and it's all carbs," so she thought her numbers were going to be bad.  She's eating meat once in a while.  Says she's been cooking keto, her own biscuits and bread, but doesn't like all the fat that most keto diets include.  "I changed how I look at things as far as cooking; it's a totally different lifestyle, you can't depend on everyone else in the world to cook for you when you've got diabetic problems."  Says she has cravings for carbs, and "once in a while I slip off, but I get back on."  She says she tries to stay in the swing of "behaving myself and doing things the right way."  - Her denies acute concerns or problems related to treatment plan  - She denies new concerns.  Denies polyuria/polydipsia, hypo/ hyperglycemia symptoms.  Denies new onset of: chest pain, exercise intolerance, shortness of breath, dizziness, visual changes, headache, lower extremity swelling or claudication.   Last A1C in the office was:  Lab Results  Component Value Date   HGBA1C 5.5 01/23/2019   HGBA1C 5.5 09/26/2018   HGBA1C 5.5 05/24/2018   Lab Results  Component Value Date   MICROALBUR 80 12/16/2017   LDLCALC 190 (H) 01/23/2019   CREATININE 0.67 05/24/2018   BP Readings from Last 3  Encounters:  01/27/19 125/75  09/29/18 135/74  08/08/18 120/80   Wt Readings from Last 3 Encounters:  01/27/19 250 lb (113.4 kg)  09/29/18 251 lb 9.6 oz (114.1 kg)  08/08/18 258 lb 9.6 oz (117.3 kg)   HPI:  Hypertension:  -  Her blood pressure at home has been running: under 130/80, in the 120's/70's; today was 125/75.  - Patient reports good compliance with medication and/or lifestyle modification  - Her denies acute concerns or problems related to treatment plan  - She denies new  onset of: chest pain, exercise intolerance, shortness of breath, dizziness, visual changes, headache, lower extremity swelling or claudication.   Last 3 blood pressure readings in our office are as follows: BP Readings from Last 3 Encounters:  01/27/19 125/75  09/29/18 135/74  08/08/18 120/80   Filed Weights   01/27/19 0813  Weight: 250 lb (113.4 kg)   HPI:  Hyperlipidemia:  58 y.o. female here for cholesterol follow-up.   - Patient reports good compliance with treatment plan of: lifestyle management.    Says she's been watching her cholesterol.  Notes "there's a lot of doctors out there running research saying that high cholesterol isn't actually that bad."  - Patient denies any acute concerns or problems with management plan   - She denies new onset of: myalgias, arthralgias, increased fatigue more than normal, chest pains, exercise intolerance, shortness of breath, dizziness, visual changes, headache, lower extremity swelling or claudication.   Most recent cholesterol panel was:  Lab Results  Component Value Date   CHOL 274 (H) 01/23/2019   HDL 41 01/23/2019   LDLCALC 190 (H) 01/23/2019   TRIG 226 (H) 01/23/2019   CHOLHDL 6.7 (H) 01/23/2019   Hepatic Function Latest Ref Rng & Units 01/23/2019 09/26/2018 05/24/2018  Total Protein 6.0 - 8.5 g/dL - - 6.3  Albumin 3.8 - 4.9 g/dL - - 4.4  AST 0 - 40 IU/L 43(H) 67(H) 50(H)  ALT 0 - 32 IU/L 91(H) 94(H) 98(H)  Alk Phosphatase 39 - 117 IU/L - - 52  Total Bilirubin 0.0 - 1.2 mg/dL - - 0.6        GAD 7 : Generalized Anxiety Score 09/29/2018  Nervous, Anxious, on Edge 0  Control/stop worrying 0  Worry too much - different things 0  Trouble relaxing 0  Restless 0  Easily annoyed or irritable 0  Afraid - awful might happen 0  Total GAD 7 Score 0  Anxiety Difficulty Not difficult at all    Depression screen [facility] 2/9 01/27/2019 09/29/2018 05/26/2018 04/22/2018 12/16/2017  Decreased Interest 0 0 0 0 0  Down, Depressed, Hopeless 0 0  0 0 0  PHQ - 2 Score 0 0 0 0 0  Altered sleeping 1 1 - 0 0  Tired, decreased energy 0 3 - 1 0  Change in appetite 0 0 - 0 0  Feeling bad or failure about yourself  0 0 - 0 0  Trouble concentrating 0 0 - 0 0  Moving slowly or fidgety/restless 0 0 - 0 0  Suicidal thoughts 0 0 - 0 0  PHQ-9 Score 1 4 - 1 0  Difficult doing work/chores Not difficult at all Somewhat difficult - Not difficult at all -      Impression and Recommendations:    1. Type 2 diabetes mellitus with other specified complication, without long-term current use of insulin ([place])   2. Mixed diabetic hyperlipidemia associated with type 2 diabetes mellitus (HCC)   3. Statin  declined   4. Hypertension associated with diabetes (HCC)   5. Elevated liver enzymes- from fatty liver disease.   6. Hepatic steatosis   7. Hypothyroidism, unspecified type   8. Obesity, Class III, BMI 40-49.9 (morbid obesity) (HCC)   9. Vitamin D deficiency      Diabetes Mellitus - A1c 5.5 last check, at goal and stable from prior. - Discussed that patient's A1c is under good control.  - Patient declines referral to diabetic nutritionist today. - Pt will continue current treatment regimen.  - Counseled patient on pathophysiology of disease and discussed various treatment options, which always includes dietary and lifestyle modification as first line.    - Importance of low carb, heart-healthy diet discussed with patient in addition to regular aerobic exercise of 30min 5d/week or more.   - Encouraged patient to continue with healthier habits as established.  - Check FBS and 2 hours after the biggest meal of your day.  Keep log and bring in next OV for my review.     - Also told patient if you ever feel poorly, please check your blood pressure and blood sugar, as one or the other could be the cause of your symptoms.  - Pt reminded about need for yearly eye and foot exams.  Told patient to make appt.for diabetic eye exam, CMAs here will do  foot exams  - Handouts provided at patient's desire and or told to go online at the American Diabetes Association website for further information  - We will continue to monitor  Hypertension associated with DM - Blood pressure currently is at goal. - Patient will continue current treatment regimen.  - Counseled patient on pathophysiology of disease and discussed various treatment options, which always includes dietary and lifestyle modification as first line.   - Lifestyle changes such as dash and heart healthy diets and engaging in a regular exercise program discussed extensively with patient.   - Ambulatory blood pressure monitoring encouraged at least 3 times weekly.  Keep log and bring in every office visit.  Reminded patient that if they ever feel poorly in any way, to check their blood pressure and pulse.  - Handouts provided at patient's desire and/or told to go online at the American Heart Association website for further information  - We will continue to monitor.  Mixed Diabetic Hyperlipidemia associated with DM - Statin Declined - Last FLP obtained 01/23/2019  - Triglycerides = 226, elevated, down from 285 prior. - LDL = 190, elevated, up from 164 two years ago - HDL = 41, up from 35 prior.  The 10-year ASCVD risk score (Goff DC Jr., et al., 2013) is: 9.4%   Values used to calculate the score:     Age: 58 years     Sex: Female     Is Non-Hispanic African American: No     Diabetic: Yes     Tobacco smoker: No     Systolic Blood Pressure: 125 mmHg     Is BP treated: Yes     HDL Cholesterol: 41 mg/dL     Total Cholesterol: 274 mg/dL  - Reviewed that patient's risk is significantly elevated with current cholesterol levels. - Discussed that patient should begin statin management. - Education provided and all questions answered.  - Patient understands elevated risks of cardiovascular event and declines statin today.  - Prudent dietary changes such as low saturated &  trans fat diets for hyperlipidemia and low carb diets for hypertriglyceridemia discussed with patient.    -   Encouraged patient to follow AHA guidelines for regular exercise and also engage in weight loss if BMI above 25.   - Educational handouts provided at patient's desire and/ or told to look online at the American Heart Association website for further information.  - We will continue to monitor  Elevated ALT, AST - Hepatic Steatosis - ALT = 91, elevated, slightly down from 94 prior. - AST = 43, down from 67 prior.  - Last abdominal ultrasound obtained in 2016, showing fatty liver infiltrate.  However, with weight loss and healthy eating, enzymes remain elevated.  - Discussed that as liver enzymes remain elevated, patient will be referred to gastroenterology for further evaluation.  Ambulatory referral placed to gastroenterology today.  - Last colonoscopy obtained in 11 of 2016 with 3 polyps found, 5 year follow up (due in 11 of 2021).  - Will continue to monitor.  Hypothyroidism - Stable at this time. - Continue management as established. - No changes made to treatment plan today. - Will continue to monitor.  Vitamin D Deficiency - Stable at this time. - Continue management as established. - No changes made to treatment plan today. - Will continue to monitor.  BMI Counseling - Body mass index is 37.46 kg/m Explained to patient what BMI refers to, and what it means medically.    Told patient to think about it as a "medical risk stratification measurement" and how increasing BMI is associated with increasing risk/ or worsening state of various diseases such as hypertension, hyperlipidemia, diabetes, premature OA, depression etc.  American Heart Association guidelines for healthy diet, basically Mediterranean diet, and exercise guidelines of 30 minutes 5 days per week or more discussed in detail.  Health counseling performed.  All questions answered.  Lifestyle & Preventative  Health Maintenance - Advised patient to continue working toward exercising to improve overall mental, physical, and emotional health.    - Reviewed the "spokes of the wheel" of mood and health management.  Stressed the importance of ongoing prudent habits, including regular exercise, appropriate sleep hygiene, healthful dietary habits, and prayer/meditation to relax.  - Encouraged patient to engage in daily physical activity, especially a formal exercise routine.  Recommended that the patient eventually strive for at least 150 minutes of moderate cardiovascular activity per week according to guidelines established by the AHA.   - Healthy dietary habits encouraged, including low-carb, and high amounts of lean protein in diet.   - Patient should also consume adequate amounts of water.  Recommendations - Re-check A1c and f/up OV in 4 months.   - As part of my medical decision making, I reviewed the following data within the electronic medical record:  History obtained from pt /family, CMA notes reviewed and incorporated if applicable, Labs reviewed, Radiograph/ tests reviewed if applicable and OV notes from prior OV's with me, as well as other specialists she/he has seen since seeing me last, were all reviewed and used in my medical decision making process today.    - Additionally, discussion had with patient regarding our treatment plan, and their biases/concerns about that plan were used in my medical decision making today.    - The patient agreed with the plan and demonstrated an understanding of the instructions.   No barriers to understanding were identified.    - Red flag symptoms and signs discussed in detail.  Patient expressed understanding regarding what to do in case of emergency\ urgent symptoms.   - The patient was advised to call back or seek an in-person   evaluation if the symptoms worsen or if the condition fails to improve as anticipated.   Return for f/up in 4 months,  in-person, with A1c re-check.    Orders Placed This Encounter  Procedures  . Ambulatory referral to Gastroenterology    No orders of the defined types were placed in this encounter.   There are no discontinued medications.    I provided 22+ minutes of non face-to-face time during this encounter.  Additional time was spent with charting and coordination of care after the actual visit commenced.   Note:  This note was prepared with assistance of Dragon voice recognition software. Occasional wrong-word or sound-a-like substitutions may have occurred due to the inherent limitations of voice recognition software.  This document serves as a record of services personally performed by Jariana Shumard, DO. It was created on her behalf by Katherine Galloway, a trained medical scribe. The creation of this record is based on the scribe's personal observations and the provider's statements to them.   This case required medical decision making of at least moderate complexity. The above documentation has been reviewed to be accurate and was completed by Jermaine Neuharth J. Clemens Lachman, D.O.    Patient Care Team    Relationship Specialty Notifications Start End  Elynore Dolinski, DO PCP - General Family Medicine  09/24/15   Copland, Alicia B, PA-C Referring Physician Obstetrics and Gynecology  09/29/18   O'Connell, Thomas L, MD Consulting Physician Internal Medicine  09/29/18    Comment: Sees for history of thyroid cancer only-not diabetes     -Vitals obtained; medications/ allergies reconciled;  personal medical, social, Sx etc.histories were updated by CMA, reviewed by me and are reflected in chart   Patient Active Problem List   Diagnosis Date Noted  . Mixed diabetic hyperlipidemia associated with type 2 diabetes mellitus (HCC) 12/16/2017    Priority: High  . Microalbuminuria due to type 2 diabetes mellitus (HCC) 12/16/2017    Priority: High  . Migraine headache with aura 02/11/2015    Priority: High  .  Hyperlipidemia 12/10/2014    Priority: High  . Obesity, Class III, BMI 40-49.9 (morbid obesity) (HCC) 11/21/2014    Priority: High  . HTN (hypertension) 11/21/2014    Priority: High  . Hypothyroidism 11/21/2014    Priority: Medium  . Hx of papillary thyroid cancer 11/21/2014    Priority: Medium  . Counseling on health promotion and disease prevention 07/31/2016    Priority: Low  . Fatty liver disease, nonalcoholic 12/18/2014    Priority: Low  . Elevated liver enzymes- from fatty liver disease. 12/10/2014    Priority: Low  . Vitamin D deficiency 12/10/2014    Priority: Low  . Consumes a vegan diet- currently, for now 09/29/2018  . Statin declined 05/26/2018  . 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 Type 2 diabetes mellitus without complication, without long-term current use of insulin (HCC) 12/16/2017  . Hypertension associated with diabetes (HCC) 12/16/2017  . Dysuria 02/08/2017  . Abnormal urinalysis 02/08/2017  . Cervical cancer screening 07/31/2016  . Screening for breast cancer 07/31/2016  . Low serum HDL 10/24/2015  . Menopause present 09/25/2015  . Special screening for malignant neoplasms, colon   . Benign neoplasm of ascending colon   . Benign neoplasm of transverse colon   . Benign neoplasm of sigmoid colon      Current Meds  Medication Sig  . amLODIPine-Valsartan-HCTZ 5-160-12.5 MG TABS Take 1 tablet by mouth once daily  . Cholecalciferol (VITAMIN D3)   125 MCG (5000 UT) CAPS   . Chromium Picolinate 1000 MCG TABS   . Co-Enzyme Q10 200 MG CAPS Take 400 mg by mouth daily.   . levothyroxine (SYNTHROID, LEVOTHROID) 150 MCG tablet Take 1 tablet by mouth daily.  . magnesium oxide (MAG-OX) 400 MG tablet Take 2 tablets by mouth daily.  . Menaquinone-7 (VITAMIN K2) 100 MCG CAPS   . metFORMIN (GLUCOPHAGE) 500 MG tablet TAKE 1 TABLET BY MOUTH TWICE DAILY WITH MEALS  . metoprolol succinate (TOPROL-XL) 100 MG 24 hr tablet 1 tab BID  . milk  thistle 175 MG tablet Take 175 mg by mouth daily.  . omega-3 acid ethyl esters (LOVAZA) 1 g capsule Take 2 capsules by mouth twice daily  . vitamin B-12 (CYANOCOBALAMIN) 1000 MCG tablet Take 1,000 mcg by mouth daily.  . zinc gluconate 50 MG tablet Take 1 tablet by mouth daily.     Allergies:  No Known Allergies   ROS:  See above HPI for pertinent positives and negatives   Objective:   Blood pressure 125/75, height 5' 8.5" (1.74 m), weight 250 lb (113.4 kg).  (if some vitals are omitted, this means that patient was UNABLE to obtain them even though they were asked to get them prior to OV today.  They were asked to call us at their earliest convenience with these once obtained. )  General: A & O * 3; sounds in no acute distress; in usual state of health.  Skin: Pt confirms warm and dry extremities and pink fingertips HEENT: Pt confirms lips non-cyanotic Chest: Patient confirms normal chest excursion and movement Respiratory: speaking in full sentences, no conversational dyspnea; patient confirms no use of accessory muscles Psych: insight appears good, mood- appears full     

## 2019-01-31 ENCOUNTER — Other Ambulatory Visit: Payer: Self-pay | Admitting: Family Medicine

## 2019-01-31 DIAGNOSIS — E119 Type 2 diabetes mellitus without complications: Secondary | ICD-10-CM

## 2019-02-01 ENCOUNTER — Telehealth: Payer: Self-pay | Admitting: Family Medicine

## 2019-02-01 NOTE — Telephone Encounter (Signed)
Patient husband calling stating patient is having abdominal pain, diarrhea and feeling really bad. He wanted to know if these were COVID symptoms and if so what to do.  I advised [name] that yes, they were COVID symptoms and that we suggest she go get COVID tested. Address to [facility] given and I advised [name] that he and patient should wear a mask around each other, quarantine until they receive results and to use separate bathrooms. He verbalized understanding and said he wasn't 'feeling to hot himself' and thought he would get tested today as well. AS, CMA

## 2019-02-01 NOTE — Telephone Encounter (Signed)
Ok, thanks for letting me know.

## 2019-02-03 ENCOUNTER — Encounter: Payer: Self-pay | Admitting: Gastroenterology

## 2019-02-08 ENCOUNTER — Encounter: Payer: Self-pay | Admitting: Family Medicine

## 2019-03-10 ENCOUNTER — Ambulatory Visit: Payer: BC Managed Care – PPO | Admitting: Gastroenterology

## 2019-04-24 ENCOUNTER — Other Ambulatory Visit: Payer: Self-pay | Admitting: Family Medicine

## 2019-04-24 DIAGNOSIS — E1159 Type 2 diabetes mellitus with other circulatory complications: Secondary | ICD-10-CM

## 2019-06-05 ENCOUNTER — Encounter: Payer: Self-pay | Admitting: Family Medicine

## 2019-06-05 ENCOUNTER — Other Ambulatory Visit: Payer: Self-pay

## 2019-06-05 ENCOUNTER — Ambulatory Visit (INDEPENDENT_AMBULATORY_CARE_PROVIDER_SITE_OTHER): Payer: BC Managed Care – PPO | Admitting: Family Medicine

## 2019-06-05 VITALS — BP 127/69 | HR 83 | Ht 68.5 in | Wt 256.0 lb

## 2019-06-05 DIAGNOSIS — E1169 Type 2 diabetes mellitus with other specified complication: Secondary | ICD-10-CM | POA: Diagnosis not present

## 2019-06-05 DIAGNOSIS — E1159 Type 2 diabetes mellitus with other circulatory complications: Secondary | ICD-10-CM

## 2019-06-05 DIAGNOSIS — R748 Abnormal levels of other serum enzymes: Secondary | ICD-10-CM

## 2019-06-05 DIAGNOSIS — Z5320 Procedure and treatment not carried out because of patient's decision for unspecified reasons: Secondary | ICD-10-CM

## 2019-06-05 DIAGNOSIS — I10 Essential (primary) hypertension: Secondary | ICD-10-CM

## 2019-06-05 DIAGNOSIS — K760 Fatty (change of) liver, not elsewhere classified: Secondary | ICD-10-CM | POA: Diagnosis not present

## 2019-06-05 DIAGNOSIS — E782 Mixed hyperlipidemia: Secondary | ICD-10-CM

## 2019-06-05 NOTE — Progress Notes (Signed)
Telehealth office visit note for [name], D.O- at Primary Care at [facility]   I connected with current patient today and verified that I am speaking with the correct person   . Location of the patient: Home . Location of the provider: Office - This visit type was conducted due to national recommendations for restrictions regarding the COVID-19 Pandemic (e.g. social distancing) in an effort to limit this patient's exposure and mitigate transmission in our community.    - No physical exam could be performed with this format, beyond that communicated to [place] by the patient/ family members as noted.   - Additionally my office staff/ schedulers were to discuss with the patient that there may be a monetary charge related to this service, depending on their medical insurance.  My understanding is that patient understood and consented to proceed.     _________________________________________________________________________________   History of Present Illness:  I, Toni Amend, am serving as Education administrator for [employer].  Says today "I'm great!"  - Recent COVID-19 Exposure Notes that her sister is currently in the hospital with pneumonia due to COVID-19, and the patient may have been exposed to [place] through her.  They are going to get tested for COVID-19 tomorrow.  Says they've always been using their masks.  The patient feels that a lot of this is spread through lack of hand washing.  - Vitamin D Supplementation She continues Vitamin D daily, "and also getting sun too."  - Hepatic steatosis, elevated liver enzymes Patient was referred to GI in November 2020.  States she made an appointment, and wasn't feeling well, and didn't get back to it.  Notes "we've been trying to buy a house for a little bit, so things have been kind of crazy paperwork-wise."  - Diet & Exercise Goals Feels that a lower fat diet has worked better for her in the long run in the past.  She switched  from keto diet to low fat diet since last OV.    They also bought a new machine to work out, [employer] for lifting.  They are getting this set up in their new house, and notes "it's a top to bottom workout, every muscle you can hit."  Says "it's the exercise, I just have to do the exercise."  She feels the exercise has to go hand in hand with what she eats.  HPI:   Diabetes Mellitus:  Home glucose readings:  Per patient, her sugars are running in the 130's after meals, and fasting has been 150's, 160's in the morning.  Says "they were running very high, like in the 170's, 180's.   - Patient reports good compliance with therapy plan: medication and/or lifestyle modification.  Continues 500 mg metformin BID.  - Her denies acute concerns or problems related to treatment plan  - She denies new concerns.  Denies polyuria/polydipsia, hypo/ hyperglycemia symptoms.  Denies new onset of: chest pain, exercise intolerance, shortness of breath, dizziness, visual changes, headache, lower extremity swelling or claudication.   Last A1C in the office was:  Lab Results  Component Value Date   HGBA1C 5.5 01/23/2019   HGBA1C 5.5 09/26/2018   HGBA1C 5.5 05/24/2018   Lab Results  Component Value Date   MICROALBUR 80 12/16/2017   LDLCALC 190 (H) 01/23/2019   CREATININE 0.67 05/24/2018   BP Readings from Last 3 Encounters:  06/09/19 125/77  06/05/19 127/69  01/27/19 125/75   Wt Readings from Last 3 Encounters:  06/05/19 256 lb (116.1 kg)  01/27/19 250 lb (113.4 kg)  09/29/18 251 lb 9.6 oz (114.1 kg)   HPI:  Hypertension:  -  Her blood pressure at home has been running: 127/68 today; feels it's been "really good," and running around this level on average.  - Patient reports good compliance with medication and/or lifestyle modification  - Her denies acute concerns or problems related to treatment plan  - She denies new onset of: chest pain, exercise intolerance, shortness of breath, dizziness,  visual changes, headache, lower extremity swelling or claudication.   Last 3 blood pressure readings in our office are as follows: BP Readings from Last 3 Encounters:  06/09/19 125/77  06/05/19 127/69  01/27/19 125/75   Filed Weights   06/05/19 1453  Weight: 256 lb (116.1 kg)          GAD 7 : Generalized Anxiety Score 09/29/2018  Nervous, Anxious, on Edge 0  Control/stop worrying 0  Worry too much - different things 0  Trouble relaxing 0  Restless 0  Easily annoyed or irritable 0  Afraid - awful might happen 0  Total GAD 7 Score 0  Anxiety Difficulty Not difficult at all    Depression screen [facility] 2/9 06/05/2019 01/27/2019 09/29/2018 05/26/2018 04/22/2018  Decreased Interest 0 0 0 0 0  Down, Depressed, Hopeless 0 0 0 0 0  PHQ - 2 Score 0 0 0 0 0  Altered sleeping 0 1 1 - 0  Tired, decreased energy 0 0 3 - 1  Change in appetite 0 0 0 - 0  Feeling bad or failure about yourself  0 0 0 - 0  Trouble concentrating 0 0 0 - 0  Moving slowly or fidgety/restless 0 0 0 - 0  Suicidal thoughts 0 0 0 - 0  PHQ-9 Score 0 1 4 - 1  Difficult doing work/chores - Not difficult at all Somewhat difficult - Not difficult at all      Impression and Recommendations:     1. Type 2 diabetes mellitus with other specified complication, without long-term current use of insulin ([place])   2. Mixed diabetic hyperlipidemia associated with type 2 diabetes mellitus (HCC)   3. Statin declined   4. Hypertension associated with diabetes ([place])   5. Hepatic steatosis   6. Elevated liver enzymes- from fatty liver disease.   7. Obesity, Class III, BMI 40-49.9 (morbid obesity) (HCC)      Type 2 DM - Monitored by Dr. Honor Junes of Endocrinology - Need for A1c re-check. - Per patient, being monitored by endocrinology in July.  - A1c most recently is stable, 5.5, at goal. - Pt will continue current treatment regimen.  See med list.  - Counseled patient on pathophysiology of disease and discussed various  treatment options, which always includes dietary and lifestyle modification as first line.    - Importance of low carb, heart-healthy diet discussed with patient in addition to regular aerobic exercise of 18mn 5d/week or more.   - Check FBS and 2 hours after the biggest meal of your day.  Keep log and bring in next OV for my review.     - Also told patient if you ever feel poorly, please check your blood pressure and blood sugar, as one or the other could be the cause of your symptoms.  - Pt reminded about need for yearly eye and foot exams.  Told patient to make appt.for diabetic eye exam, CMAs here will do foot exams  - We  will continue to monitor.   Mixed Diabetic Hyperlipidemia associated with type 2 DM - statin declined - Last FLP obtained 4 months ago:  Triglycerides = 226, elevated, down from 285 eight months prior. HDL = 41, up from 35 last check. LDL = 190, elevated, up from 132 eight months prior.  The 10-year ASCVD risk score Mikey Bussing DC Jr., et al., 2013) is: 9.7%  - Patient's cholesterol is not at goal. - Patient continues to decline statin management.  - Dietary changes such as low saturated & trans fat diets for hyperlipidemia and low carb/ ketogenic diets for hypertriglyceridemia discussed with patient.    - Encouraged patient to follow AHA guidelines for regular exercise and also engage in weight loss if BMI above 25.   - We will continue to monitor.   HTN associated with DM - Blood pressure currently is stable, at goal. - Patient will continue current treatment regimen.  See med list.  - Counseled patient on pathophysiology of disease and discussed various treatment options, which always includes dietary and lifestyle modification as first line.   - Lifestyle changes such as dash and heart healthy diets and engaging in a regular exercise program discussed extensively with patient.   - Ambulatory blood pressure monitoring encouraged at least 3 times weekly.   Keep log and bring in every office visit.  Reminded patient that if they ever feel poorly in any way, to check their blood pressure and pulse.  - We will continue to monitor.   Hepatic Steatosis - Elevated liver enzymes from fatty liver disease - Patient was referred to gastroenterology in November 2020. - Per patient, has not made her appointment with specialist yet.  - Strongly advised patient to follow-up with gastroenterology as recommended.  - Continue prudent dietary and lifestyle goals as recommended.  - Will continue to monitor.   BMI Counseling - Body mass index is 38.36 kg/m, Class III Obesity Explained to patient what BMI refers to, and what it means medically.  Told patient to think about it as a "medical risk stratification measurement" and how increasing BMI is associated with increasing risk/ or worsening state of various diseases such as hypertension, hyperlipidemia, diabetes, premature OA, depression etc.  American Heart Association guidelines for healthy diet, basically Mediterranean diet, and exercise guidelines of 30 minutes 5 days per week or more discussed in detail.  Health counseling performed.  All questions answered.   Health Counseling & Preventative Maintenance - Advised patient to continue working toward exercising to improve overall mental, physical, and emotional health.    - Reviewed the "spokes of the wheel" of mood and wellbeing.  Stressed the importance of ongoing prudent habits, including regular exercise, appropriate sleep hygiene, healthful dietary habits, and prayer/meditation to relax.  - Encouraged patient to engage in daily physical activity as tolerated, especially a formal exercise routine.  Recommended that the patient eventually strive for at least 150 minutes of moderate cardiovascular activity per week according to guidelines established by the [facility].   - Healthy dietary habits encouraged, including low-carb, and high amounts of lean  protein in diet.   - Patient should also consume adequate amounts of water.      - As part of my medical decision making, I reviewed the following data within the [place] History obtained from pt /family, CMA notes reviewed and incorporated if applicable, Labs reviewed, Radiograph/ tests reviewed if applicable and OV notes from prior OV's with me, as well as other specialists she/he has  seen since seeing me last, were all reviewed and used in my medical decision making process today.    - Additionally, when appropriate, discussion had with patient regarding our treatment plan, and their biases/concerns about that plan were used in my medical decision making today.    - The patient agreed with the plan and demonstrated an understanding of the instructions.   No barriers to understanding were identified.     - The patient was advised to call back or seek an in-person evaluation if the symptoms worsen or if the condition fails to improve as anticipated.   Return for f/up 6 months for BP, HLD, elevated liver enzymes, etc.    Time spent on visit including pre-visit chart review and post-visit care was 16 minutes.    Note:  This note was prepared with assistance of Dragon voice recognition software. Occasional wrong-word or sound-a-like substitutions may have occurred due to the inherent limitations of voice recognition software.  The [place] was signed into law in 2016 which includes the topic of electronic health records.  This provides immediate access to information in MyChart.  This includes consultation notes, operative notes, office notes, lab results and pathology reports.  If you have any questions about what you read please let us know at your next visit or call us at the office.  We are right here with you.  This document serves as a record of services personally performed by [name], DO. It was created on her behalf by Toni Amend, a  trained medical scribe. The creation of this record is based on the scribe's personal observations and the provider's statements to them.    The above documentation from Toni Amend, medical scribe, has been reviewed by Marjory Sneddon, D.O. _________________________________________________________________________________   Patient Care Team    Relationship Specialty Notifications Start End  [name], DO PCP - General Family Medicine  07/02/14   Amalia Greenhouse Referring Physician Obstetrics and Gynecology  09/29/18   Lonia Farber, MD Consulting Physician Endocrinology  09/29/18    Comment: Sees for history of thyroid cancer only & DM as well     -Vitals obtained; medications/ allergies reconciled;  personal medical, social, Sx etc.histories were updated by CMA, reviewed by me and are reflected in chart   Patient Active Problem List   Diagnosis Date Noted  . 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 Microalbuminuria due to type 2 diabetes mellitus ([place]) 12/16/2017  . Migraine headache with aura 02/11/2015  . Hyperlipidemia 12/10/2014  . Obesity, Class III, BMI 40-49.9 (morbid obesity) ([place]) 11/21/2014  . HTN (hypertension) 11/21/2014  . Hypothyroidism 11/21/2014  . Hx of papillary thyroid cancer 11/21/2014  . Counseling on health promotion and disease prevention 07/31/2016  . Fatty liver disease, nonalcoholic 38/46/6599  . Elevated liver enzymes- from fatty liver disease. 12/10/2014  . Vitamin D deficiency 12/10/2014  . Consumes a vegan diet- currently, for now 09/29/2018  . Statin declined 05/26/2018  . Type 2 diabetes mellitus with hypertriglyceridemia ([place]) 04/22/2018  . Type 2 diabetes mellitus without complication, without long-term current use of insulin ([place]) 12/16/2017  . Hypertension associated with diabetes ([place]) 12/16/2017  . Dysuria 02/08/2017  . Abnormal urinalysis 02/08/2017  . Cervical  cancer screening 07/31/2016  . Screening for breast cancer 07/31/2016  . Low serum HDL 10/24/2015  . Menopause present 09/25/2015  . Special screening for malignant neoplasms, colon   . Benign neoplasm of  ascending colon   . Benign neoplasm of transverse colon   . Benign neoplasm of sigmoid colon      Current Meds  Medication Sig  . amLODIPine-Valsartan-HCTZ 5-160-12.5 MG TABS Take 1 tablet by mouth once daily  . Cholecalciferol (VITAMIN D3) 125 MCG (5000 UT) CAPS   . Chromium 1000 MCG TABS Take 2 tablets by mouth 2 (two) times a week.   . Chromium Picolinate 1000 MCG TABS   . Co-Enzyme Q10 200 MG CAPS Take 400 mg by mouth daily.   Marland Kitchen levothyroxine (SYNTHROID, LEVOTHROID) 150 MCG tablet Take 1 tablet by mouth daily.  . magnesium oxide (MAG-OX) 400 MG tablet Take 2 tablets by mouth daily.  . Menaquinone-7 (VITAMIN K2) 100 MCG CAPS   . metFORMIN (GLUCOPHAGE) 500 MG tablet TAKE 1 TABLET BY MOUTH TWICE DAILY WITH MEALS  . metoprolol succinate (TOPROL-XL) 100 MG 24 hr tablet Take 1 tablet by mouth twice daily  . milk thistle 175 MG tablet Take 175 mg by mouth daily.  Marland Kitchen omega-3 acid ethyl esters (LOVAZA) 1 g capsule Take 2 capsules by mouth twice daily  . vitamin B-12 (CYANOCOBALAMIN) 1000 MCG tablet Take 1,000 mcg by mouth daily.  Marland Kitchen zinc gluconate 50 MG tablet Take 1 tablet by mouth daily.     Allergies:  No Known Allergies   ROS:  See above HPI for pertinent positives and negatives   Objective:   Blood pressure 127/69, pulse 83, height 5' 8.5" (1.74 m), weight 256 lb (116.1 kg).  (if some vitals are omitted, this means that patient was UNABLE to obtain them even though they were asked to get them prior to OV today.  They were asked to call us at their earliest convenience with these once obtained. ) General: A & O * 3; sounds in no acute distress; in usual state of health.  Skin: Pt confirms warm and dry extremities and pink fingertips HEENT: Pt confirms lips non-cyanotic  Chest: Patient confirms normal chest excursion and movement Respiratory: speaking in full sentences, no conversational dyspnea; patient confirms no use of accessory muscles Psych: insight appears good, mood- appears full

## 2019-06-09 ENCOUNTER — Other Ambulatory Visit: Payer: Self-pay | Admitting: Unknown Physician Specialty

## 2019-06-09 ENCOUNTER — Encounter (HOSPITAL_COMMUNITY): Payer: Self-pay

## 2019-06-09 ENCOUNTER — Telehealth: Payer: Self-pay | Admitting: Unknown Physician Specialty

## 2019-06-09 ENCOUNTER — Ambulatory Visit (HOSPITAL_COMMUNITY)
Admission: RE | Admit: 2019-06-09 | Discharge: 2019-06-09 | Disposition: A | Discharge status: Home / Self Care | Payer: BC Managed Care – PPO | Source: Ambulatory Visit | Attending: Pulmonary Disease | Admitting: Pulmonary Disease

## 2019-06-09 DIAGNOSIS — E119 Type 2 diabetes mellitus without complications: Secondary | ICD-10-CM | POA: Insufficient documentation

## 2019-06-09 DIAGNOSIS — U071 COVID-19: Secondary | ICD-10-CM | POA: Insufficient documentation

## 2019-06-09 MED ORDER — EPINEPHRINE 0.3 MG/0.3ML IJ SOAJ: 0.3000 mg | Freq: Once | INTRAMUSCULAR | Status: DC | PRN

## 2019-06-09 MED ORDER — DIPHENHYDRAMINE HCL 50 MG/ML IJ SOLN: 50.0000 mg | Freq: Once | INTRAMUSCULAR | Status: DC | PRN

## 2019-06-09 MED ORDER — FAMOTIDINE IN NACL 20-0.9 MG/50ML-% IV SOLN: 20.0000 mg | Freq: Once | INTRAVENOUS | Status: DC | PRN

## 2019-06-09 MED ORDER — SODIUM CHLORIDE 0.9 % IV SOLN: INTRAVENOUS | Status: DC | PRN

## 2019-06-09 MED ORDER — METHYLPREDNISOLONE SODIUM SUCC 125 MG IJ SOLR: 125.0000 mg | Freq: Once | INTRAMUSCULAR | Status: DC | PRN

## 2019-06-09 MED ORDER — SODIUM CHLORIDE 0.9 % IV SOLN
Freq: Once | INTRAVENOUS | Status: AC
  Filled 2019-06-09: qty 20

## 2019-06-09 MED ORDER — ALBUTEROL SULFATE HFA 108 (90 BASE) MCG/ACT IN AERS: 2.0000 | INHALATION_SPRAY | Freq: Once | RESPIRATORY_TRACT | Status: DC | PRN

## 2019-06-09 NOTE — Telephone Encounter (Signed)
  I connected by phone with Miranda Delacruz on 06/09/2019 at 9:41 AM to discuss the potential use of an new treatment for mild to moderate COVID-19 viral infection in non-hospitalized patients.  This patient is a 59 y.o. female that meets the FDA criteria for Emergency Use Authorization of bamlanivimab/etesevimab or casirivimab/imdevimab.  Has a (+) direct SARS-CoV-2 viral test result  Has mild or moderate COVID-19   Is ? 57 years of age and weighs ? 40 kg  Is NOT hospitalized due to COVID-19  Is NOT requiring oxygen therapy or requiring an increase in baseline oxygen flow rate due to COVID-19  Is within 10 days of symptom onset  Has at least one of the high risk factor(s) for progression to severe COVID-19 and/or hospitalization as defined in EUA.  Specific high risk criteria : Diabetes and BMI >35   I have spoken and communicated the following to the patient or parent/caregiver:  1. FDA has authorized the emergency use of bamlanivimab/etesevimab and casirivimab\imdevimab for the treatment of mild to moderate COVID-19 in adults and pediatric patients with positive results of direct SARS-CoV-2 viral testing who are 37 years of age and older weighing at least 40 kg, and who are at high risk for progressing to severe COVID-19 and/or hospitalization.  2. The significant known and potential risks and benefits of bamlanivimab/etesevimab and casirivimab\imdevimab, and the extent to which such potential risks and benefits are unknown.  3. Information on available alternative treatments and the risks and benefits of those alternatives, including clinical trials.  4. Patients treated with bamlanivimab/etesevimab and casirivimab\imdevimab should continue to self-isolate and use infection control measures (e.g., wear mask, isolate, social distance, avoid sharing personal items, clean and disinfect "high touch" surfaces, and frequent handwashing) according to CDC guidelines.   5. The patient or  parent/caregiver has the option to accept or refuse bamlanivimab/etesevimab or casirivimab\imdevimab .  After reviewing this information with the patient, The patient agreed to proceed with receiving the bamlanimivab infusion and will be provided a copy of the Fact sheet prior to receiving the infusion.Miranda Delacruz 06/09/2019 9:41 AM  Symptom onset today - tested positive yesterday due to close family contact

## 2019-06-09 NOTE — Discharge Instructions (Signed)

## 2019-07-02 ENCOUNTER — Ambulatory Visit (INDEPENDENT_AMBULATORY_CARE_PROVIDER_SITE_OTHER)
Admission: RE | Admit: 2019-07-02 | Discharge: 2019-07-02 | Disposition: A | Discharge status: Home / Self Care | Payer: BC Managed Care – PPO | Source: Ambulatory Visit

## 2019-07-02 DIAGNOSIS — J0110 Acute frontal sinusitis, unspecified: Secondary | ICD-10-CM | POA: Diagnosis not present

## 2019-07-02 MED ORDER — AMOXICILLIN-POT CLAVULANATE 875-125 MG PO TABS
1.0000 | ORAL_TABLET | Freq: Two times a day (BID) | ORAL | 0 refills | Status: DC
Start: 2019-07-02 — End: 2020-04-30

## 2019-07-02 MED ORDER — FLUCONAZOLE 150 MG PO TABS
150.0000 mg | ORAL_TABLET | Freq: Every day | ORAL | 0 refills | Status: DC
Start: 2019-07-02 — End: 2020-04-30

## 2019-07-02 NOTE — Discharge Instructions (Addendum)
Take antibiotic twice daily with food. Take first tablet of Diflucan with last tablet of Augmentin, may take another 1 in 3 days if needed. Follow-up with primary care in 1-2 weeks for persistent or worsening symptoms.

## 2019-07-02 NOTE — ED Provider Notes (Signed)
Virtual Visit via Video Note:  Miranda Delacruz  initiated request for Telemedicine visit with [facility] team. I connected with Miranda Delacruz  on 07/02/2019 at 9:21 AM  for a synchronized telemedicine visit using a video enabled HIPPA compliant telemedicine application. I verified that I am speaking with Miranda Delacruz  using two identifiers. Miranda Hall-Potvin, PA-C  was physically located in a [facility] site and Marytza Grandpre was located at a different location.   The limitations of evaluation and management by telemedicine as well as the availability of in-person appointments were discussed. Patient was informed that she  may incur a bill ( including co-pay) for this virtual visit encounter. Miranda Delacruz  expressed understanding and gave verbal consent to proceed with virtual visit.     History of Present Illness:Miranda Delacruz  is a 59 y.o. female presents with 2-week course of nasal congestion and frontal sinus pressure.  Patient diagnosed with Covid 4/13.  Has had worsening congestion and pressure since finishing quarantine 2 weeks ago.  Patient still has no smell or taste.  Denies fever, cough, difficulty breathing.  Has tried OTC medications without relief.    ROI as per HPI  Past Medical History:  Diagnosis Date  . Cancer ([place])   . Hyperlipidemia   . Hypertension   . Thyroid disease     No Known Allergies      Observations/Objective: 59 y.o. female sitting in no acute distress.  Patient is able to speak in full sentences without coughing, sneezing, wheezing.  Assessment and Plan: H&P consistent with subacute sinusitis: We will treat for bacterial as she is greater than 2 weeks out from completing Covid quarantine.  Return precautions discussed, patient verbalized understanding and is agreeable to plan.  Follow Up Instructions: Patient to seek in-person evaluation for persistent/worsening symptoms.   I discussed the assessment and treatment  plan with the patient. The patient was provided an opportunity to ask questions and all were answered. The patient agreed with the plan and demonstrated an understanding of the instructions.   The patient was advised to call back or seek an in-person evaluation if the symptoms worsen or if the condition fails to improve as anticipated.  I provided 15 minutes of non-face-to-face time during this encounter.    [place], PA-C  07/02/2019 9:21 AM        Delacruz, [place], PA-C 07/02/19 1158

## 2019-08-14 ENCOUNTER — Other Ambulatory Visit: Payer: Self-pay | Admitting: Family Medicine

## 2019-08-14 DIAGNOSIS — E1159 Type 2 diabetes mellitus with other circulatory complications: Secondary | ICD-10-CM

## 2019-08-17 ENCOUNTER — Other Ambulatory Visit: Payer: Self-pay | Admitting: Physician Assistant

## 2019-08-17 DIAGNOSIS — E1159 Type 2 diabetes mellitus with other circulatory complications: Secondary | ICD-10-CM

## 2019-08-17 MED ORDER — METOPROLOL SUCCINATE ER 100 MG PO TB24: ORAL_TABLET | ORAL | 0 refills | Status: DC

## 2019-08-21 ENCOUNTER — Other Ambulatory Visit: Payer: Self-pay | Admitting: Physician Assistant

## 2019-08-21 DIAGNOSIS — E1159 Type 2 diabetes mellitus with other circulatory complications: Secondary | ICD-10-CM

## 2019-08-21 MED ORDER — AMLODIPINE-VALSARTAN-HCTZ 5-160-12.5 MG PO TABS: 1.0000 | ORAL_TABLET | Freq: Every day | ORAL | 0 refills | Status: DC

## 2019-08-22 ENCOUNTER — Other Ambulatory Visit: Payer: Self-pay

## 2019-08-22 MED ORDER — VALSARTAN 160 MG PO TABS: 160.0000 mg | ORAL_TABLET | Freq: Every day | ORAL | 0 refills | Status: DC

## 2019-08-22 MED ORDER — HYDROCHLOROTHIAZIDE 12.5 MG PO CAPS: 12.5000 mg | ORAL_CAPSULE | Freq: Every day | ORAL | 0 refills | Status: DC

## 2019-08-22 MED ORDER — AMLODIPINE BESYLATE 5 MG PO TABS
5.0000 mg | ORAL_TABLET | Freq: Every day | ORAL | 3 refills | Status: DC
Start: 2019-08-22 — End: 2020-09-12

## 2019-08-22 NOTE — Telephone Encounter (Signed)
Received fax from pharmacy stating that amlodopine/valsartan/HCTZ combo medication is unavailable in brand or generic form.  They are requesting an alternative medication.  RXs sent to pharmacy for the individual medications per protocol.  Charyl Bigger, CMA

## 2019-12-11 ENCOUNTER — Telehealth: Payer: Self-pay | Admitting: Physician Assistant

## 2019-12-11 DIAGNOSIS — I152 Hypertension secondary to endocrine disorders: Secondary | ICD-10-CM

## 2019-12-11 DIAGNOSIS — E1159 Type 2 diabetes mellitus with other circulatory complications: Secondary | ICD-10-CM

## 2019-12-11 MED ORDER — VALSARTAN 160 MG PO TABS: 160.0000 mg | ORAL_TABLET | Freq: Every day | ORAL | 0 refills | Status: DC

## 2019-12-11 MED ORDER — HYDROCHLOROTHIAZIDE 12.5 MG PO CAPS: 12.5000 mg | ORAL_CAPSULE | Freq: Every day | ORAL | 0 refills | Status: DC

## 2019-12-11 MED ORDER — METOPROLOL SUCCINATE ER 100 MG PO TB24: 100.0000 mg | ORAL_TABLET | Freq: Two times a day (BID) | ORAL | 0 refills | Status: DC

## 2019-12-11 NOTE — Telephone Encounter (Signed)
Please call patient to schedule apt for further refills. AS, CMA

## 2020-02-13 ENCOUNTER — Encounter: Payer: Self-pay | Admitting: Physician Assistant

## 2020-02-13 ENCOUNTER — Other Ambulatory Visit: Payer: Self-pay | Admitting: Physician Assistant

## 2020-02-13 DIAGNOSIS — E1159 Type 2 diabetes mellitus with other circulatory complications: Secondary | ICD-10-CM

## 2020-02-13 MED ORDER — METOPROLOL SUCCINATE ER 100 MG PO TB24: 100.0000 mg | ORAL_TABLET | Freq: Two times a day (BID) | ORAL | 0 refills | Status: DC

## 2020-02-13 MED ORDER — VALSARTAN 160 MG PO TABS: 160.0000 mg | ORAL_TABLET | Freq: Every day | ORAL | 0 refills | Status: DC

## 2020-02-13 MED ORDER — HYDROCHLOROTHIAZIDE 12.5 MG PO CAPS: 12.5000 mg | ORAL_CAPSULE | Freq: Every day | ORAL | 0 refills | Status: DC

## 2020-02-27 ENCOUNTER — Encounter: Payer: Self-pay | Admitting: Physician Assistant

## 2020-03-03 ENCOUNTER — Telehealth: Payer: Self-pay | Admitting: Physician Assistant

## 2020-03-05 ENCOUNTER — Other Ambulatory Visit: Payer: BC Managed Care – PPO

## 2020-03-05 ENCOUNTER — Encounter: Payer: Self-pay | Admitting: Physician Assistant

## 2020-03-05 ENCOUNTER — Ambulatory Visit: Payer: BC Managed Care – PPO | Admitting: Physician Assistant

## 2020-03-05 ENCOUNTER — Other Ambulatory Visit: Payer: Self-pay

## 2020-03-05 VITALS — BP 128/81 | HR 62 | Ht 68.5 in | Wt 253.2 lb

## 2020-03-05 DIAGNOSIS — E039 Hypothyroidism, unspecified: Secondary | ICD-10-CM | POA: Diagnosis not present

## 2020-03-05 DIAGNOSIS — I152 Hypertension secondary to endocrine disorders: Secondary | ICD-10-CM

## 2020-03-05 DIAGNOSIS — Z6837 Body mass index (BMI) 37.0-37.9, adult: Secondary | ICD-10-CM

## 2020-03-05 DIAGNOSIS — E1169 Type 2 diabetes mellitus with other specified complication: Secondary | ICD-10-CM

## 2020-03-05 DIAGNOSIS — E1159 Type 2 diabetes mellitus with other circulatory complications: Secondary | ICD-10-CM | POA: Diagnosis not present

## 2020-03-05 DIAGNOSIS — E119 Type 2 diabetes mellitus without complications: Secondary | ICD-10-CM | POA: Diagnosis not present

## 2020-03-05 DIAGNOSIS — Z1211 Encounter for screening for malignant neoplasm of colon: Secondary | ICD-10-CM

## 2020-03-05 DIAGNOSIS — E782 Mixed hyperlipidemia: Secondary | ICD-10-CM

## 2020-03-05 LAB — POCT UA - MICROALBUMIN
Albumin/Creatinine Ratio, Urine, POC: <30
Creatinine, POC: 50 mg/dL
Microalbumin Ur, POC: 10 mg/L

## 2020-03-05 LAB — POCT GLYCOSYLATED HEMOGLOBIN (HGB A1C): Hemoglobin A1C: 5.8 % — AB (ref 4.0–5.6)

## 2020-03-05 MED ORDER — VALSARTAN 160 MG PO TABS: 160.0000 mg | ORAL_TABLET | Freq: Every day | ORAL | 1 refills | Status: DC

## 2020-03-05 MED ORDER — HYDROCHLOROTHIAZIDE 12.5 MG PO CAPS: 12.5000 mg | ORAL_CAPSULE | Freq: Every day | ORAL | 1 refills | Status: DC

## 2020-03-05 MED ORDER — METOPROLOL SUCCINATE ER 100 MG PO TB24: 100.0000 mg | ORAL_TABLET | Freq: Two times a day (BID) | ORAL | 1 refills | Status: DC

## 2020-03-05 NOTE — Patient Instructions (Signed)

## 2020-03-05 NOTE — Assessment & Plan Note (Signed)
-  A1c today 5.8, at goal. -Followed by endocrinology. Placed new referral. -Continue current medication regimen.  -Recommend to monitor carbohydrates and glucose. -Continue ambulatory glucose monitoring.

## 2020-03-05 NOTE — Assessment & Plan Note (Signed)
-   Followed by endocrinology.  Placed new referral. -Continue current medication regimen.

## 2020-03-05 NOTE — Assessment & Plan Note (Signed)
-  Controlled. -Continue current medication regimen.  Provided refills. (Of note, amlodipine not due for refill yet). -Continue good hydration and recommend to follow a low-sodium diet. -Will repeat CMP today for medication monitoring.

## 2020-03-05 NOTE — Assessment & Plan Note (Addendum)
-  Last lipid panel: total cholesterol 274, triglycerides 226, HDL 41, LDL 190 (goal <70) -Patient prefers to wait on repeating lipid panel until next office due to recent dietary changes. Recommend to monitor/reduce saturated and trans fats as well as simple carbohydrates. Prefers to continue with dietary and lifestyle changes instead of statin therapy.  -Continue to stay as active as possible. -Will continue to monitor.

## 2020-03-05 NOTE — Progress Notes (Signed)
Established Patient Office Visit  Subjective:  Patient ID: Miranda Delacruz, female    DOB: [birth year]  Age: 60 y.o. MRN: [id number]  CC:  Chief Complaint  Patient presents with  . Diabetes  . Hyperlipidemia  . Hypertension  . Hypothyroidism    HPI Miranda Delacruz presents for follow up on diabetes mellitus, hypertension, hyperlipidemia and hypothyroidism.   Diabetes mellitus: Followed by endocrinology- Dr. Honor Junes with [facility].  Pt denies increased urination or thirst. Pt reports medication compliance. No hypoglycemic events. Checking glucose at home. FBS 120-130, PPS 100.  HTN: Pt denies chest pain, palpitations, dizziness or lower extremity swelling. Taking medication as directed without side effects. Checks BP at home  and readings range 120s/80s. Pt reports good hydration.   HLD: Pt is currently trying to manage with diet and lifestyle modifications.  Reports started a diet in December 31 which is high-fat and low carbohydrates. Does weight lifting. Patient is requesting to have cholesterol rechecked next office visit in 6 months since recently made dietary changes.   Hypothyroid: Followed by endocrinology.  Denies fatigue, palpitations, weight or bowel changes. Patient is requesting to transfer care to endocrinology office in [place] which is closer than [place].  Past Medical History:  Diagnosis Date  . Cancer ([place])   . Hyperlipidemia   . Hypertension   . Thyroid disease     Past Surgical History:  Procedure Laterality Date  . COLONOSCOPY  2000   normal  . COLONOSCOPY WITH PROPOFOL N/A 01/22/2015   Procedure: COLONOSCOPY WITH PROPOFOL;  Surgeon: Lucilla Lame, MD;  Location: [facility] ENDOSCOPY;  Service: Endoscopy;  Laterality: N/A;  . THYROIDECTOMY    . TONSILLECTOMY    . TUBAL LIGATION      Family History  Problem Relation Age of Onset  . Hypertension Mother   . Hypertension Maternal Grandmother   . Stroke Maternal Grandmother     Social History    Socioeconomic History  . Marital status: Married    Spouse name: Not on file  . Number of children: Not on file  . Years of education: Not on file  . Highest education level: Not on file  Occupational History  . Not on file  Tobacco Use  . Smoking status: Former Smoker    Quit date: 02/24/1987    Years since quitting: 33.0  . Smokeless tobacco: Never Used  Vaping Use  . Vaping Use: Never used  Substance and Sexual Activity  . Alcohol use: Yes    Alcohol/week: 0.0 standard drinks  . Drug use: No  . Sexual activity: Yes    Birth control/protection: Post-menopausal  Other Topics Concern  . Not on file  Social History Narrative  . Not on file   Social Determinants of Health   Financial Resource Strain: Not on file  Food Insecurity: Not on file  Transportation Needs: Not on file  Physical Activity: Not on file  Stress: Not on file  Social Connections: Not on file  Intimate Partner Violence: Not on file    Outpatient Medications Prior to Visit  Medication Sig Dispense Refill  . amLODipine (NORVASC) 5 MG tablet Take 1 tablet (5 mg total) by mouth daily. 90 tablet 3  . amoxicillin-clavulanate (AUGMENTIN) 875-125 MG tablet Take 1 tablet by mouth every 12 (twelve) hours. 14 tablet 0  . Cholecalciferol (VITAMIN D3) 125 MCG (5000 UT) CAPS     . Chromium 1000 MCG TABS Take 2 tablets by mouth 2 (two) times a week.     Marland Kitchen  Chromium Picolinate 1000 MCG TABS     . Co-Enzyme Q10 200 MG CAPS Take 400 mg by mouth daily.     . fluconazole (DIFLUCAN) 150 MG tablet Take 1 tablet (150 mg total) by mouth daily. May repeat in 72 hours if needed 2 tablet 0  . levothyroxine (SYNTHROID, LEVOTHROID) 150 MCG tablet Take 175 mcg by mouth daily.    . magnesium oxide (MAG-OX) 400 MG tablet Take 2 tablets by mouth daily.    . Menaquinone-7 (VITAMIN K2) 100 MCG CAPS     . metFORMIN (GLUCOPHAGE) 500 MG tablet TAKE 1 TABLET BY MOUTH TWICE DAILY WITH MEALS 180 tablet 0  . milk thistle 175 MG tablet Take  175 mg by mouth daily.    Marland Kitchen omega-3 acid ethyl esters (LOVAZA) 1 g capsule Take 2 capsules by mouth twice daily 360 capsule 0  . TURMERIC PO Take 2 tablets by mouth daily.    . vitamin B-12 (CYANOCOBALAMIN) 1000 MCG tablet Take 1,000 mcg by mouth daily.    Marland Kitchen zinc gluconate 50 MG tablet Take 1 tablet by mouth daily.    . hydrochlorothiazide (MICROZIDE) 12.5 MG capsule Take 1 capsule (12.5 mg total) by mouth daily. **NEEDS APT FOR FURTHER REFILLS** 30 capsule 0  . metoprolol succinate (TOPROL-XL) 100 MG 24 hr tablet Take 1 tablet (100 mg total) by mouth 2 (two) times daily. Take 1 tablet by mouth twice daily**NEEDS APT FOR FURTHER REFILLS** 60 tablet 0  . valsartan (DIOVAN) 160 MG tablet Take 1 tablet (160 mg total) by mouth daily. **NEEDS APT FOR FURTHER REFILLS** 30 tablet 0   No facility-administered medications prior to visit.    No Known Allergies  ROS Review of Systems A fourteen system review of systems was performed and found to be positive as per HPI.   Objective:    Physical Exam General:  Well Developed, well nourished, in no acute distress.  Neuro:  Alert and oriented,  extra-ocular muscles intact  HEENT:  Normocephalic, atraumatic, neck supple, Skin:  no gross rash, warm, pink. Cardiac:  RRR, S1 S2 Respiratory:  ECTA B/L w/o wheezing, Not using accessory muscles, speaking in full sentences- unlabored. Vascular:  Ext warm, no cyanosis apprec.; cap RF less 2 sec. Psych:  No HI/SI, judgement and insight good, Euthymic mood. Full Affect.  BP 128/81   Pulse 62   Ht 5' 8.5" (1.74 m)   Wt 253 lb 3.2 oz (114.9 kg)   SpO2 99%   BMI 37.94 kg/m  Wt Readings from Last 3 Encounters:  03/05/20 253 lb 3.2 oz (114.9 kg)  06/05/19 256 lb (116.1 kg)  01/27/19 250 lb (113.4 kg)     Health Maintenance Due  Topic Date Due  . COVID-19 Vaccine (1) Never done  . FOOT EXAM  12/17/2018  . INFLUENZA VACCINE  09/24/2019  . COLONOSCOPY (Pts 45-51yr Insurance coverage will need to be  confirmed)  01/22/2020  . OPHTHALMOLOGY EXAM  01/24/2020  . MAMMOGRAM  03/07/2020    There are no preventive care reminders to display for this patient.  Lab Results  Component Value Date   TSH 7.110 (H) 12/17/2017   Lab Results  Component Value Date   WBC 4.1 12/17/2017   HGB 14.9 12/17/2017   HCT 42.9 12/17/2017   MCV 95 12/17/2017   PLT 201 12/17/2017   Lab Results  Component Value Date   NA 138 03/05/2020   K 4.3 03/05/2020   CO2 25 03/05/2020   GLUCOSE 143 (H) 03/05/2020  BUN 13 03/05/2020   CREATININE 0.84 03/05/2020   BILITOT 1.0 03/05/2020   ALKPHOS 54 03/05/2020   AST 62 (H) 03/05/2020   ALT 116 (H) 03/05/2020   PROT 6.8 03/05/2020   ALBUMIN 5.2 (H) 03/05/2020   CALCIUM 9.6 03/05/2020   Lab Results  Component Value Date   CHOL 274 (H) 01/23/2019   Lab Results  Component Value Date   HDL 41 01/23/2019   Lab Results  Component Value Date   LDLCALC 190 (H) 01/23/2019   Lab Results  Component Value Date   TRIG 226 (H) 01/23/2019   Lab Results  Component Value Date   CHOLHDL 6.7 (H) 01/23/2019   Lab Results  Component Value Date   HGBA1C 5.8 (A) 03/05/2020      Assessment & Plan:   Problem List Items Addressed This Visit      Cardiovascular and Mediastinum   Hypertension associated with diabetes ([place])    -Controlled. -Continue current medication regimen.  Provided refills. (Of note, amlodipine not due for refill yet). -Continue good hydration and recommend to follow a low-sodium diet. -Will repeat CMP today for medication monitoring.      Relevant Medications   hydrochlorothiazide (MICROZIDE) 12.5 MG capsule   metoprolol succinate (TOPROL-XL) 100 MG 24 hr tablet   valsartan (DIOVAN) 160 MG tablet   Other Relevant Orders   Comp Met (CMET) (Completed)     Endocrine   Hypothyroidism (Chronic)    - Followed by endocrinology.  Placed new referral. -Continue current medication regimen.      Relevant Medications   metoprolol  succinate (TOPROL-XL) 100 MG 24 hr tablet   Other Relevant Orders   Ambulatory referral to Endocrinology   Type 2 diabetes mellitus without complication, without long-term current use of insulin ([facility]) - Primary    -A1c today 5.8, at goal. -Followed by endocrinology. Placed new referral. -Continue current medication regimen.  -Recommend to monitor carbohydrates and glucose. -Continue ambulatory glucose monitoring.       Relevant Medications   valsartan (DIOVAN) 160 MG tablet   Other Relevant Orders   POCT glycosylated hemoglobin (Hb A1C) (Completed)   POCT UA - Microalbumin (Completed)   Ambulatory referral to Endocrinology   Ambulatory referral to Endocrinology   Mixed diabetic hyperlipidemia associated with type 2 diabetes mellitus ([place])    -Last lipid panel: total cholesterol 274, triglycerides 226, HDL 41, LDL 190 (goal <70) -Patient prefers to wait on repeating lipid panel until next office due to recent dietary changes. Recommend to monitor/reduce saturated and trans fats as well as simple carbohydrates. Prefers to continue with dietary and lifestyle changes instead of statin therapy.  -Continue to stay as active as possible. -Will continue to monitor.      Relevant Medications   hydrochlorothiazide (MICROZIDE) 12.5 MG capsule   metoprolol succinate (TOPROL-XL) 100 MG 24 hr tablet   valsartan (DIOVAN) 160 MG tablet    Other Visit Diagnoses    Screening for colon cancer       Relevant Orders   Ambulatory referral to Gastroenterology   Class 2 severe obesity with serious comorbidity and body mass index (BMI) of 37.0 to 37.9 in adult, unspecified obesity type ([place])         Class 2 severe obesity with serious comorbidity and body mass index (BMI) of 37.0 to 37.9 in adult, unspecified obesity type: -Associated with hypertension, diabetes mellitus and hyperlipidemia.  -Patient has lost approximately 3 pounds since last visit. -Encourage to continue with weight loss efforts  and  with current dietary changes monitor saturated and trans fats. Recommend to follow a Mediterranean diet or use My Fitness Pal or Lose It App.   Meds ordered this encounter  Medications  . hydrochlorothiazide (MICROZIDE) 12.5 MG capsule    Sig: Take 1 capsule (12.5 mg total) by mouth daily.    Dispense:  90 capsule    Refill:  1    Order Specific Question:   Supervising Provider    Answer:   Beatrice Lecher D [[id number]]  . metoprolol succinate (TOPROL-XL) 100 MG 24 hr tablet    Sig: Take 1 tablet (100 mg total) by mouth 2 (two) times daily. Take 1 tablet by mouth twice daily    Dispense:  90 tablet    Refill:  1    Order Specific Question:   Supervising Provider    Answer:   Beatrice Lecher D [[id number]]  . valsartan (DIOVAN) 160 MG tablet    Sig: Take 1 tablet (160 mg total) by mouth daily.    Dispense:  90 tablet    Refill:  1    Order Specific Question:   Supervising Provider    Answer:   Beatrice Lecher D [[id number]]    Follow-up: Return in about 6 months (around 09/02/2020) for HLD, HTN, DM and FBW.   Note:  This note was prepared with assistance of Dragon voice recognition software. Occasional wrong-word or sound-a-like substitutions may have occurred due to the inherent limitations of voice recognition software.  Lorrene Reid, PA-C

## 2020-03-06 ENCOUNTER — Encounter: Payer: Self-pay | Admitting: Physician Assistant

## 2020-03-06 ENCOUNTER — Other Ambulatory Visit: Payer: Self-pay | Admitting: Physician Assistant

## 2020-03-06 DIAGNOSIS — Z1231 Encounter for screening mammogram for malignant neoplasm of breast: Secondary | ICD-10-CM

## 2020-03-06 LAB — COMPREHENSIVE METABOLIC PANEL
ALT: 116 IU/L — ABNORMAL HIGH (ref 0–32)
AST: 62 IU/L — ABNORMAL HIGH (ref 0–40)
Albumin/Globulin Ratio: 3.3 — ABNORMAL HIGH (ref 1.2–2.2)
Albumin: 5.2 g/dL — ABNORMAL HIGH (ref 3.8–4.9)
Alkaline Phosphatase: 54 IU/L (ref 44–121)
BUN/Creatinine Ratio: 15 (ref 9–23)
BUN: 13 mg/dL (ref 6–24)
Bilirubin Total: 1 mg/dL (ref 0.0–1.2)
CO2: 25 mmol/L (ref 20–29)
Calcium: 9.6 mg/dL (ref 8.7–10.2)
Chloride: 99 mmol/L (ref 96–106)
Creatinine, Ser: 0.84 mg/dL (ref 0.57–1.00)
GFR calc Af Amer: 89 mL/min/{1.73_m2} (ref >59)
GFR calc non Af Amer: 77 mL/min/{1.73_m2} (ref >59)
Globulin, Total: 1.6 g/dL (ref 1.5–4.5)
Glucose: 143 mg/dL — ABNORMAL HIGH (ref 65–99)
Potassium: 4.3 mmol/L (ref 3.5–5.2)
Sodium: 138 mmol/L (ref 134–144)
Total Protein: 6.8 g/dL (ref 6.0–8.5)

## 2020-03-15 ENCOUNTER — Other Ambulatory Visit: Payer: Self-pay

## 2020-03-15 ENCOUNTER — Ambulatory Visit
Admission: RE | Admit: 2020-03-15 | Discharge: 2020-03-15 | Disposition: A | Discharge status: Home / Self Care | Payer: BC Managed Care – PPO | Source: Ambulatory Visit

## 2020-03-15 DIAGNOSIS — Z1231 Encounter for screening mammogram for malignant neoplasm of breast: Secondary | ICD-10-CM

## 2020-03-18 ENCOUNTER — Other Ambulatory Visit: Payer: Self-pay | Admitting: Physician Assistant

## 2020-03-18 DIAGNOSIS — R928 Other abnormal and inconclusive findings on diagnostic imaging of breast: Secondary | ICD-10-CM

## 2020-03-27 ENCOUNTER — Encounter: Payer: Self-pay | Admitting: Physician Assistant

## 2020-03-29 ENCOUNTER — Ambulatory Visit
Admission: RE | Admit: 2020-03-29 | Discharge: 2020-03-29 | Disposition: A | Discharge status: Home / Self Care | Payer: BC Managed Care – PPO | Source: Ambulatory Visit | Attending: Physician Assistant | Admitting: Physician Assistant

## 2020-03-29 ENCOUNTER — Other Ambulatory Visit: Payer: Self-pay

## 2020-03-29 ENCOUNTER — Ambulatory Visit: Payer: BC Managed Care – PPO

## 2020-03-29 DIAGNOSIS — R928 Other abnormal and inconclusive findings on diagnostic imaging of breast: Secondary | ICD-10-CM

## 2020-04-01 ENCOUNTER — Telehealth: Payer: Self-pay | Admitting: Internal Medicine

## 2020-04-01 NOTE — Telephone Encounter (Signed)
Patient with history of villous adenoma and tubular adenoma, last colonoscopy 2016 Recall colonoscopy would have been recommended in 3 years so she is due for surveillance at this time This can be scheduled via previsit

## 2020-04-01 NOTE — Telephone Encounter (Signed)
Called patient to schedule procedure, but stated will call back to schedule.

## 2020-04-01 NOTE — Telephone Encounter (Signed)
Good morning Dr. Hilarie Fredrickson, we received a referral for patient to have colonoscopy.  Her last colonoscopy was in 2016.  Records are in epic.  Can you please review and advise on scheduling?  Thank you.

## 2020-04-30 ENCOUNTER — Ambulatory Visit (AMBULATORY_SURGERY_CENTER): Payer: BC Managed Care – PPO | Admitting: *Deleted

## 2020-04-30 ENCOUNTER — Other Ambulatory Visit: Payer: Self-pay

## 2020-04-30 VITALS — Ht 68.5 in | Wt 255.0 lb

## 2020-04-30 DIAGNOSIS — Z86010 Personal history of colonic polyps: Secondary | ICD-10-CM

## 2020-04-30 MED ORDER — PLENVU 140 G PO SOLR: 1.0000 | Freq: Once | ORAL | 0 refills | Status: AC

## 2020-04-30 NOTE — Progress Notes (Signed)
No egg or soy allergy known to patient  No issues with past sedation with any surgeries or procedures No intubation problems in the past  No FH of Malignant Hyperthermia No diet pills per patient No home 02 use per patient  No blood thinners per patient  Pt denies issues with constipation  No A fib or A flutter  EMMI video to pt or via [place] 19 guidelines implemented in PV today with Pt and RN  Pt is fully vaccinated  for Covid    Coupon given to pt in PV today , Code to Pharmacy and  NO PA's for preps discussed with pt In PV today  Discussed with pt there will be an out-of-pocket cost for prep and that varies from $0 to 70 dollars   Due to the COVID-19 pandemic we are asking patients to follow certain guidelines.  Pt aware of COVID protocols and [facility] guidelines

## 2020-05-03 ENCOUNTER — Telehealth: Payer: Self-pay | Admitting: Internal Medicine

## 2020-05-03 DIAGNOSIS — Z86010 Personal history of colonic polyps: Secondary | ICD-10-CM

## 2020-05-03 MED ORDER — PEG 3350-KCL-NA BICARB-NACL 420 G PO SOLR: 4000.0000 mL | Freq: Once | ORAL | 0 refills | Status: AC

## 2020-05-03 NOTE — Telephone Encounter (Signed)
Called Plenvu coupon into pt's pharmacy. plenvu will cost $61.23. called patient, no answer,left message for the pt to call me back.

## 2020-05-03 NOTE — Telephone Encounter (Signed)
Inbound call from patient stating Plenvu is over $100 and is requesting a more affordable prep be sent to pharmacy please.

## 2020-05-03 NOTE — Telephone Encounter (Signed)
Patient does want to pay the $61 for plenvu. Golytely offered and patient will do that prep. She understands she must drink all of it and take dulcolax. New prep instructions sent to pt's Mychart and RX sent to pharmacy- pt is aware. She will call us with questions.

## 2020-05-13 ENCOUNTER — Encounter: Payer: Self-pay | Admitting: Internal Medicine

## 2020-05-17 ENCOUNTER — Other Ambulatory Visit: Payer: Self-pay

## 2020-05-17 ENCOUNTER — Ambulatory Visit
Admission: RE | Admit: 2020-05-17 | Discharge: 2020-05-17 | Disposition: A | Discharge status: Home / Self Care | Payer: BC Managed Care – PPO | Source: Ambulatory Visit | Attending: Nurse Practitioner | Admitting: Nurse Practitioner

## 2020-05-17 ENCOUNTER — Encounter: Payer: Self-pay | Admitting: Nurse Practitioner

## 2020-05-17 ENCOUNTER — Ambulatory Visit: Payer: BC Managed Care – PPO | Admitting: Nurse Practitioner

## 2020-05-17 VITALS — BP 131/78 | HR 61 | Temp 97.7°F | Ht 68.0 in | Wt 264.5 lb

## 2020-05-17 DIAGNOSIS — M25561 Pain in right knee: Secondary | ICD-10-CM | POA: Insufficient documentation

## 2020-05-17 NOTE — Patient Instructions (Signed)
Baker Cyst  A Baker cyst, also called a popliteal cyst, is a growth that forms at the back of the knee. The cyst forms when the fluid-filled sac (bursa) that cushions the knee joint becomes enlarged. What are the causes? In most cases, a Baker cyst results from another knee problem that causes swelling inside the knee. This makes the fluid inside the knee joint (synovial fluid) flow into the bursa behind the knee, causing the bursa to enlarge. What increases the risk? You may be more likely to develop a Baker cyst if you already have a knee problem, such as:  A tear in cartilage that cushions the knee joint (meniscal tear).  A tear in the tissues that connect the bones of the knee joint (ligament tear).  Knee swelling from osteoarthritis, rheumatoid arthritis, or gout. What are the signs or symptoms? The main symptom of this condition is a lump behind the knee. This may be the only symptom of the condition. The lump may be painful, especially when the knee is straightened. If the lump is painful, the pain may come and go. The knee may also be stiff. Symptoms may quickly get more severe if the cyst breaks open (ruptures). If the cyst ruptures, you may feel the following in your knee and calf:  Sudden or worsening pain.  Swelling.  Bruising.  Redness in the calf. A Baker cyst does not always cause symptoms. How is this diagnosed? This condition may be diagnosed based on your symptoms and medical history. Your health care provider will also do a physical exam. This may include:  Feeling the cyst to check whether it is tender.  Checking your knee for signs of another knee condition that causes swelling. You may have imaging tests, such as:  X-rays.  MRI.  Ultrasound. How is this treated? A Baker cyst that is not painful may go away without treatment. If the cyst gets large or painful, it will likely get better if the underlying knee problem is treated. If needed, treatment for a  Baker cyst may include:  Resting.  Keeping weight off of the knee. This means not leaning on the knee to support your body weight.  Taking NSAIDs, such as ibuprofen, to reduce pain and swelling.  Having a procedure to drain the fluid from the cyst with a needle (aspiration). You may also get an injection of a medicine that reduces swelling (steroid).  Having surgery. This may be needed if other treatments do not work. This usually involves correcting knee damage and removing the cyst. Follow these instructions at home: Activity  Rest as told by your health care provider.  Avoid activities that make pain or swelling worse.  Return to your normal activities as told by your health care provider. Ask your health care provider what activities are safe for you.  Do not use the injured limb to support your body weight until your health care provider says that you can. Use crutches as told by your health care provider. General instructions  Take over-the-counter and prescription medicines only as told by your health care provider.  Keep all follow-up visits as told by your health care provider. This is important.   Contact a health care provider if:  You have knee pain, stiffness, or swelling that does not get better. Get help right away if:  You have sudden or worsening pain and swelling in your calf area. Summary  A Baker cyst, also called a popliteal cyst, is a growth that forms at  the back of the knee.  In most cases, a Baker cyst results from another knee problem that causes swelling inside the knee.  A Baker cyst that is not painful may go away without treatment.  If needed, treatment for a Baker cyst may include resting, keeping weight off of the knee, medicines, or draining fluid from the cyst.  Surgery may be needed if other treatments are not effective. This information is not intended to replace advice given to you by your health care provider. Make sure you discuss any  questions you have with your health care provider. Document Revised: 06/24/2018 Document Reviewed: 06/24/2018 Elsevier Patient Education  [employer].

## 2020-05-17 NOTE — Progress Notes (Signed)
/g   Acute Office Visit  Subjective:    Patient ID: Miranda Delacruz, female    DOB: 1960-05-05, 60 y.o.   MRN: [id number]  Chief Complaint  Patient presents with  . Knee Pain    HPI Patient is in today for evaluation or right knee pain. States that she fell onto her right side about six months ago, she states that she tripped over something in the driveway and landed on hands and knees. She states that knee did not bother her at that time. Now, she has been doing painting in her home. Has been going up and down a ladder several times each day. States that bending and putting weight on the right knee is causing moderate pain. She states pain is mostly on the anterior aspect of the right knee and behind the knee itself. She states that pain has made it difficult to stand at all and certainly difficult to complete activities she normally does. She has been taking ibuprofen 891m when needed. This helps for short period of time. She also has been propping up her right leg and alternating application of hot and cold to the knee. She states this helps also, but eventually, pain comes right back.   Past Medical History:  Diagnosis Date  . [place]    thyroid  . [place]   . GERD (gastroesophageal reflux disease)   . Hyperlipidemia   . Hypertension   . Thyroid disease     Past Surgical History:  Procedure Laterality Date  . COLONOSCOPY  2000   normal  . COLONOSCOPY    . COLONOSCOPY WITH PROPOFOL N/A 01/22/2015   Procedure: COLONOSCOPY WITH PROPOFOL;  Surgeon: DLucilla Lame MD;  Location: [facility] ENDOSCOPY;  Service: Endoscopy;  Laterality: N/A;  . POLYPECTOMY    . THYROIDECTOMY    . TONSILLECTOMY    . TUBAL LIGATION      Family History  Problem Relation Age of Onset  . Hypertension Mother   . Hypertension Maternal Grandmother   . Stroke Maternal Grandmother   . Colon polyps Neg Hx   . Colon cancer Neg Hx   . Esophageal cancer Neg Hx   . Rectal  cancer Neg Hx   . Stomach cancer Neg Hx     Social History   Socioeconomic History  . Marital status: Married    Spouse name: Not on file  . Number of children: Not on file  . Years of education: Not on file  . Highest education level: Not on file  Occupational History  . Not on file  Tobacco Use  . Smoking status: Former Smoker    Quit date: 02/24/1987    Years since quitting: 33.2  . Smokeless tobacco: Never Used  Vaping Use  . Vaping Use: Never used  Substance and Sexual Activity  . Alcohol use: Yes    Alcohol/week: 0.0 standard drinks  . Drug use: No  . Sexual activity: Yes    Birth control/protection: Post-menopausal  Other Topics Concern  . Not on file  Social History Narrative  . Not on file   Social Determinants of Health   Financial Resource Strain: Not on file  Food Insecurity: Not on file  Transportation Needs: Not on file  Physical Activity: Not on file  Stress: Not on file  Social Connections: Not on file  Intimate Partner Violence: Not on file    Outpatient Medications Prior to Visit  Medication Sig Dispense Refill  . amLODipine (NORVASC) 5  MG tablet Take 1 tablet (5 mg total) by mouth daily. 90 tablet 3  . Cholecalciferol (VITAMIN D3) 125 MCG (5000 UT) CAPS     . Co-Enzyme Q10 200 MG CAPS Take 400 mg by mouth daily.     . EUTHYROX 175 MCG tablet Take 175 mcg by mouth every morning.    . hydrochlorothiazide (MICROZIDE) 12.5 MG capsule Take 1 capsule (12.5 mg total) by mouth daily. 90 capsule 1  . magnesium oxide (MAG-OX) 400 MG tablet Take 2 tablets by mouth daily.    . Menaquinone-7 (VITAMIN K2) 100 MCG CAPS     . metFORMIN (GLUCOPHAGE) 500 MG tablet TAKE 1 TABLET BY MOUTH TWICE DAILY WITH MEALS 180 tablet 0  . metoprolol succinate (TOPROL-XL) 100 MG 24 hr tablet Take 1 tablet (100 mg total) by mouth 2 (two) times daily. Take 1 tablet by mouth twice daily 90 tablet 1  . milk thistle 175 MG tablet Take 175 mg by mouth daily.    . TURMERIC PO Take 2  tablets by mouth daily.    . valsartan (DIOVAN) 160 MG tablet Take 1 tablet (160 mg total) by mouth daily. 90 tablet 1  . vitamin B-12 (CYANOCOBALAMIN) 1000 MCG tablet Take 1,000 mcg by mouth daily.    Marland Kitchen zinc gluconate 50 MG tablet Take 1 tablet by mouth daily.    . Chromium 1000 MCG TABS Take 2 tablets by mouth 2 (two) times a week.  (Patient not taking: No sig reported)    . Chromium Picolinate 1000 MCG TABS  (Patient not taking: No sig reported)    . omega-3 acid ethyl esters (LOVAZA) 1 g capsule Take 2 capsules by mouth twice daily (Patient not taking: Reported on 04/30/2020) 360 capsule 0   No facility-administered medications prior to visit.    No Known Allergies  Review of Systems  Constitutional: Positive for activity change. Negative for chills and fever.       Not able to participate normally in usual activities due to degree of knee pain.   HENT: Negative for congestion and sinus pain.   Eyes: Negative.   Respiratory: Negative for cough and wheezing.   Cardiovascular: Negative for chest pain and palpitations.  Gastrointestinal: Negative.  Negative for constipation.  Musculoskeletal: Positive for arthralgias. Negative for back pain and myalgias.       Pain of right knee along the outer aspect and behind the knee. Bending the knee and applying weight to the right leg increases the pain.   Skin: Negative for rash.  Neurological: Negative for dizziness, weakness and headaches.  Psychiatric/Behavioral: The patient is not nervous/anxious.   All other systems reviewed and are negative.      Objective:    Physical Exam Vitals and nursing note reviewed.  Constitutional:      Appearance: She is well-developed.  HENT:     Head: Normocephalic.  Eyes:     Pupils: Pupils are equal, round, and reactive to light.  Cardiovascular:     Rate and Rhythm: Normal rate and regular rhythm.     Heart sounds: Normal heart sounds.  Pulmonary:     Effort: Pulmonary effort is normal.      Breath sounds: Normal breath sounds.  Abdominal:     Palpations: Abdomen is soft.  Musculoskeletal:        General: Normal range of motion.     Cervical back: Normal range of motion and neck supple.       Legs:  Skin:  General: Skin is warm and dry.  Neurological:     General: No focal deficit present.     Mental Status: She is alert and oriented to person, place, and time.  Psychiatric:        Mood and Affect: Mood normal.        Behavior: Behavior normal.        Thought Content: Thought content normal.        Judgment: Judgment normal.     Today's Vitals   05/17/20 1059  BP: 131/78  Pulse: 61  Temp: 97.7 F (36.5 C)  SpO2: 95%  Weight: 264 lb 8 oz (120 kg)  Height: 5' 8"  (1.727 m)   Body mass index is 40.22 kg/m.   Wt Readings from Last 3 Encounters:  05/17/20 264 lb 8 oz (120 kg)  04/30/20 255 lb (115.7 kg)  03/05/20 253 lb 3.2 oz (114.9 kg)    Health Maintenance Due  Topic Date Due  . FOOT EXAM  12/17/2018  . COLONOSCOPY (Pts 45-37yr Insurance coverage will need to be confirmed)  01/22/2020  . OPHTHALMOLOGY EXAM  01/24/2020    There are no preventive care reminders to display for this patient.   Lab Results  Component Value Date   TSH 7.110 (H) 12/17/2017   Lab Results  Component Value Date   WBC 4.1 12/17/2017   HGB 14.9 12/17/2017   HCT 42.9 12/17/2017   MCV 95 12/17/2017   PLT 201 12/17/2017   Lab Results  Component Value Date   NA 138 03/05/2020   K 4.3 03/05/2020   CO2 25 03/05/2020   GLUCOSE 143 (H) 03/05/2020   BUN 13 03/05/2020   CREATININE 0.84 03/05/2020   BILITOT 1.0 03/05/2020   ALKPHOS 54 03/05/2020   AST 62 (H) 03/05/2020   ALT 116 (H) 03/05/2020   PROT 6.8 03/05/2020   ALBUMIN 5.2 (H) 03/05/2020   CALCIUM 9.6 03/05/2020   Lab Results  Component Value Date   CHOL 274 (H) 01/23/2019   Lab Results  Component Value Date   HDL 41 01/23/2019   Lab Results  Component Value Date   LDLCALC 190 (H) 01/23/2019   Lab  Results  Component Value Date   TRIG 226 (H) 01/23/2019   Lab Results  Component Value Date   CHOLHDL 6.7 (H) 01/23/2019   Lab Results  Component Value Date   HGBA1C 5.8 (A) 03/05/2020       Assessment & Plan:  1. Acute pain of right knee Concern for Baker's cyst or ligamental injury of right knee. Will get x-ray. Refer to orthopedics as indicated. Advised her to contuse Apply a compressive ACE bandage. Rest and elevate the affected painful area.  Alternate application of warm and cold compresses as needed.  As pain recedes, begin normal activities slowly as tolerated.   - DG Knee 1-2 Views Right; Future   Problem List Items Addressed This Visit      Other   Acute pain of right knee - Primary   Relevant Orders   DG Knee 1-2 Views Right      Time spent with the patient was approximately 22 minutes. This time included reviewing progress notes, labs, imaging studies, and discussing plan for follow up.   HRonnell Freshwater NP

## 2020-05-20 ENCOUNTER — Ambulatory Visit (AMBULATORY_SURGERY_CENTER): Payer: BC Managed Care – PPO | Admitting: Internal Medicine

## 2020-05-20 ENCOUNTER — Encounter: Payer: Self-pay | Admitting: Nurse Practitioner

## 2020-05-20 ENCOUNTER — Encounter: Payer: Self-pay | Admitting: Internal Medicine

## 2020-05-20 ENCOUNTER — Telehealth: Payer: Self-pay | Admitting: Physician Assistant

## 2020-05-20 ENCOUNTER — Other Ambulatory Visit: Payer: Self-pay

## 2020-05-20 VITALS — BP 153/71 | HR 52 | Temp 97.1°F | Resp 18 | Ht 68.5 in | Wt 255.0 lb

## 2020-05-20 DIAGNOSIS — D120 Benign neoplasm of cecum: Secondary | ICD-10-CM

## 2020-05-20 DIAGNOSIS — D125 Benign neoplasm of sigmoid colon: Secondary | ICD-10-CM

## 2020-05-20 DIAGNOSIS — D122 Benign neoplasm of ascending colon: Secondary | ICD-10-CM

## 2020-05-20 DIAGNOSIS — K635 Polyp of colon: Secondary | ICD-10-CM

## 2020-05-20 DIAGNOSIS — Z86010 Personal history of colonic polyps: Secondary | ICD-10-CM

## 2020-05-20 MED ORDER — SODIUM CHLORIDE 0.9 % IV SOLN: 500.0000 mL | Freq: Once | INTRAVENOUS | Status: DC

## 2020-05-20 NOTE — Patient Instructions (Signed)
YOU HAD AN ENDOSCOPIC PROCEDURE TODAY AT THE [facility]:   Refer to the procedure report that was given to you for any specific questions about what was found during the examination.  If the procedure report does not answer your questions, please call your gastroenterologist to clarify.  If you requested that your care partner not be given the details of your procedure findings, then the procedure report has been included in a sealed envelope for you to review at your convenience later.  YOU SHOULD EXPECT: Some feelings of bloating in the abdomen. Passage of more gas than usual.  Walking can help get rid of the air that was put into your GI tract during the procedure and reduce the bloating. If you had a lower endoscopy (such as a colonoscopy or flexible sigmoidoscopy) you may notice spotting of blood in your stool or on the toilet paper. If you underwent a bowel prep for your procedure, you may not have a normal bowel movement for a few days.  Please Note:  You might notice some irritation and congestion in your nose or some drainage.  This is from the oxygen used during your procedure.  There is no need for concern and it should clear up in a day or so.  SYMPTOMS TO REPORT IMMEDIATELY:   Following lower endoscopy (colonoscopy or flexible sigmoidoscopy):  Excessive amounts of blood in the stool  Significant tenderness or worsening of abdominal pains  Swelling of the abdomen that is new, acute  Fever of 100F or higher   Following upper endoscopy (EGD)  Vomiting of blood or coffee ground material  New chest pain or pain under the shoulder blades  Painful or persistently difficult swallowing  New shortness of breath  Fever of 100F or higher  Black, tarry-looking stools  For urgent or emergent issues, a gastroenterologist can be reached at any hour by calling [phone]. Do not use MyChart messaging for urgent concerns.    DIET:  We do recommend a small meal at first, but  then you may proceed to your regular diet.  Drink plenty of fluids but you should avoid alcoholic beverages for 24 hours.  ACTIVITY:  You should plan to take it easy for the rest of today and you should NOT DRIVE or use heavy machinery until tomorrow (because of the sedation medicines used during the test).    FOLLOW UP: Our staff will call the number listed on your records 48-72 hours following your procedure to check on you and address any questions or concerns that you may have regarding the information given to you following your procedure. If we do not reach you, we will leave a message.  We will attempt to reach you two times.  During this call, we will ask if you have developed any symptoms of COVID 19. If you develop any symptoms (ie: fever, flu-like symptoms, shortness of breath, cough etc.) before then, please call [phone].  If you test positive for Covid 19 in the 2 weeks post procedure, please call and report this information to us.    If any biopsies were taken you will be contacted by phone or by letter within the next 1-3 weeks.  Please call us at [phone] if you have not heard about the biopsies in 3 weeks.    SIGNATURES/CONFIDENTIALITY: You and/or your care partner have signed paperwork which will be entered into your electronic medical record.  These signatures attest to the fact that that the information above on   your After Visit Summary has been reviewed and is understood.  Full responsibility of the confidentiality of this discharge information lies with you and/or your care-partner. 

## 2020-05-20 NOTE — Progress Notes (Signed)
Report to PACU, RN, vss, BBS= Clear.  

## 2020-05-20 NOTE — Telephone Encounter (Signed)
Patient is experiencing knee pain and the pain has subsided and now it is going all the way down her leg and getting worse. Over the counter meds are not helping. Please advise, thanks

## 2020-05-20 NOTE — Progress Notes (Signed)
Please let the patient know that her knee x-ray looks good. No joint effusion or obvious evidence of baker's cyst. I can refer her to orthopedics for further evaluation. Does she have a provider she prefers? Thanks.

## 2020-05-20 NOTE — Op Note (Signed)
[place] Patient Name: Miranda Delacruz Procedure Date: 05/20/2020 2:31 PM MRN: [id number] Endoscopist: Jerene Bears , MD Age: 60 Referring MD:  Date of Birth: 02-Jul-1960 Gender: Female Account #: [id number] Procedure:                Colonoscopy Indications:              High risk colon cancer surveillance: Personal                            history of adenoma with villous component, Last                            colonoscopy: November 2016 Medicines:                Monitored Anesthesia Care Procedure:                Pre-Anesthesia Assessment:                           - Prior to the procedure, a History and Physical                            was performed, and patient medications and                            allergies were reviewed. The patient's tolerance of                            previous anesthesia was also reviewed. The risks                            and benefits of the procedure and the sedation                            options and risks were discussed with the patient.                            All questions were answered, and informed consent                            was obtained. Prior Anticoagulants: The patient has                            taken no previous anticoagulant or antiplatelet                            agents. ASA Grade Assessment: II - A patient with                            mild systemic disease. After reviewing the risks                            and benefits, the patient was deemed in  satisfactory condition to undergo the procedure.                           After obtaining informed consent, the colonoscope                            was passed under direct vision. Throughout the                            procedure, the patient's blood pressure, pulse, and                            oxygen saturations were monitored continuously. The                            Olympus CF-HQ190 [id number]) Colonoscope  was                            introduced through the anus and advanced to the                            cecum, identified by appendiceal orifice and                            ileocecal valve. The colonoscopy was performed                            without difficulty. The patient tolerated the                            procedure well. The quality of the bowel                            preparation was good. The ileocecal valve,                            appendiceal orifice, and rectum were photographed. Scope In: 2:45:17 PM Scope Out: 3:05:59 PM Scope Withdrawal Time: 0 hours 15 minutes 33 seconds  Total Procedure Duration: 0 hours 20 minutes 42 seconds  Findings:                 The digital rectal exam was normal.                           A 4 mm polyp was found in the cecum. The polyp was                            sessile. The polyp was removed with a cold snare.                            Resection and retrieval were complete.                           A 5 mm polyp was found in the ascending colon. The  polyp was sessile. The polyp was removed with a                            cold snare. Resection and retrieval were complete.                           A 9 mm polyp was found in the ascending colon. The                            polyp was sessile. The polyp was removed with a                            cold snare. Resection and retrieval were complete.                           Two sessile polyps were found in the sigmoid colon.                            The polyps were 4 to 5 mm in size. These polyps                            were removed with a cold snare. Resection and                            retrieval were complete.                           Multiple small and large-mouthed diverticula were                            found in the sigmoid colon, descending colon and                            transverse colon.                           The  retroflexed view of the distal rectum and anal                            verge was normal and showed no anal or rectal                            abnormalities. Complications:            No immediate complications. Estimated Blood Loss:     Estimated blood loss was minimal. Impression:               - One 4 mm polyp in the cecum, removed with a cold                            snare. Resected and retrieved.                           - One 5 mm polyp  in the ascending colon, removed                            with a cold snare. Resected and retrieved.                           - One 9 mm polyp in the ascending colon, removed                            with a cold snare. Resected and retrieved.                           - Two 4 to 5 mm polyps in the sigmoid colon,                            removed with a cold snare. Resected and retrieved.                           - Diverticulosis in the sigmoid colon, in the                            descending colon and in the transverse colon.                           - The distal rectum and anal verge are normal on                            retroflexion view. Recommendation:           - Patient has a contact number available for                            emergencies. The signs and symptoms of potential                            delayed complications were discussed with the                            patient. Return to normal activities tomorrow.                            Written discharge instructions were provided to the                            patient.                           - Resume previous diet.                           - Continue present medications.                           - Await pathology results.                           -  Repeat colonoscopy is recommended for                            surveillance. The colonoscopy date will be                            determined after pathology results from today's                             exam become available for review. Jerene Bears, MD 05/20/2020 3:10:18 PM This report has been signed electronically.

## 2020-05-20 NOTE — Progress Notes (Signed)
Called to room to assist during endoscopic procedure.  Patient ID and intended procedure confirmed with present staff. Received instructions for my participation in the procedure from the performing physician.  

## 2020-05-20 NOTE — Progress Notes (Signed)
VS-CW  Pt's states no medical or surgical changes since previsit or office visit.

## 2020-05-20 NOTE — Telephone Encounter (Signed)
Called pt no answer LVM for pt to call back we can refer her to an orthopedics if she does not have one please advised

## 2020-05-21 ENCOUNTER — Other Ambulatory Visit: Payer: Self-pay | Admitting: Nurse Practitioner

## 2020-05-21 DIAGNOSIS — M791 Myalgia, unspecified site: Secondary | ICD-10-CM

## 2020-05-21 DIAGNOSIS — M25561 Pain in right knee: Secondary | ICD-10-CM

## 2020-05-21 MED ORDER — CYCLOBENZAPRINE HCL 10 MG PO TABS: ORAL_TABLET | ORAL | 0 refills | Status: DC

## 2020-05-21 MED ORDER — ETODOLAC 400 MG PO TABS: 400.0000 mg | ORAL_TABLET | Freq: Two times a day (BID) | ORAL | 1 refills | Status: DC

## 2020-05-21 MED ORDER — DICLOFENAC SODIUM 1 % EX GEL: 4.0000 g | Freq: Four times a day (QID) | CUTANEOUS | 2 refills | Status: DC

## 2020-05-21 NOTE — Telephone Encounter (Signed)
Patient states she does not want a referral at the moment due to the cost and she is going to try the iodine and the antiinflammatory medication.

## 2020-05-21 NOTE — Progress Notes (Signed)
Patient continuing to have right knee pain and muscle pain in right lower leg. Negative x-ray. Added lodine 411m twice daily as needed to reduce pain and inflammation. May apply voltaren gel to effected areas up to four times daily as needed for pain/inflammatin. Also added flexriol 162mtablets. May take 1/2 to 1 tablet at bedtime as needed for muscle pain/spasms. [facility].

## 2020-05-30 ENCOUNTER — Encounter: Payer: Self-pay | Admitting: Internal Medicine

## 2020-06-17 ENCOUNTER — Other Ambulatory Visit: Payer: Self-pay | Admitting: Physician Assistant

## 2020-06-17 DIAGNOSIS — E1159 Type 2 diabetes mellitus with other circulatory complications: Secondary | ICD-10-CM

## 2020-06-17 DIAGNOSIS — I152 Hypertension secondary to endocrine disorders: Secondary | ICD-10-CM

## 2020-07-04 ENCOUNTER — Encounter: Payer: Self-pay | Admitting: Nurse Practitioner

## 2020-07-04 DIAGNOSIS — M25569 Pain in unspecified knee: Secondary | ICD-10-CM

## 2020-07-11 ENCOUNTER — Ambulatory Visit: Payer: BC Managed Care – PPO | Admitting: Physician Assistant

## 2020-07-11 ENCOUNTER — Encounter: Payer: Self-pay | Admitting: Physician Assistant

## 2020-07-11 DIAGNOSIS — M25561 Pain in right knee: Secondary | ICD-10-CM

## 2020-07-11 MED ORDER — METHYLPREDNISOLONE ACETATE 40 MG/ML IJ SUSP
40.0000 mg | INTRAMUSCULAR | Status: AC | PRN
  Administered 2020-07-11: 40 mg via INTRA_ARTICULAR

## 2020-07-11 MED ORDER — LIDOCAINE HCL 1 % IJ SOLN
3.0000 mL | INTRAMUSCULAR | Status: AC | PRN
  Administered 2020-07-11: 3 mL

## 2020-07-11 NOTE — Progress Notes (Signed)
Office Visit Note   Patient: Miranda Delacruz           Date of Birth: [birth year]           MRN: [id number] Visit Date: 07/11/2020              Requested by: Miranda Freshwater, NP [place],  [place] [id number] PCP: Miranda Freshwater, NP   Assessment & Plan: Visit Diagnoses:  1. Acute pain of right knee     Plan: Discussed with patient quad strengthening exercises knee friendly exercises.  We will see her back in 2 weeks to see what type of response she had to the injection.  If she continues to have mechanical symptoms or recurrent effusion recommend MRI to rule out meniscal tear.  Questions were encouraged and answered at length.  Patient tolerated aspiration injection well today.  Follow-Up Instructions: Return in about 2 weeks (around 07/25/2020).   Orders:  Orders Placed This Encounter  Procedures  . Large Joint Inj   No orders of the defined types were placed in this encounter.     Procedures: Large Joint Inj: R knee on 07/11/2020 3:53 PM Indications: pain Details: 22 G 1.5 in needle, anterolateral approach  Arthrogram: No  Medications: 3 mL lidocaine 1 %; 40 mg methylPREDNISolone acetate 40 MG/ML Aspirate: 16 mL yellow and blood-tinged Outcome: tolerated well, no immediate complications Procedure, treatment alternatives, risks and benefits explained, specific risks discussed. Consent was given by the patient. Immediately prior to procedure a time out was called to verify the correct patient, procedure, equipment, support staff and site/side marked as required. Patient was prepped and draped in the usual sterile fashion.       Clinical Data: No additional findings.   Subjective: Chief Complaint  Patient presents with  . Right Knee - Pain    HPI  Miranda Delacruz 60 year old female being seen for the first time for right knee pain.  Right knee pain in February.  She reports 1 fall in January but states that her pain in the right knee did not  begin until 1 month later.  She had no other injury.  In January she tripped over a pole that was lying flat in her yard.  Landed on all fours going down a hill.  She states she has some painful popping in the knee at times but no giving way catching or locking.  She notes some swelling.  She has tried Journalist, newspaper and muscle relaxant without any real relief.  She is also tried knee brace.  She states the knee swells after activity.  She have no calf pain shortness of breath fevers chills. Radiographs right knee dated 05/19/2020 are reviewed and show no acute fracture dislocation.  Knee overall well-preserved. Review of Systems  Constitutional: Negative for chills and fever.     Objective: Vital Signs: There were no vitals taken for this visit.  Physical Exam Constitutional:      Appearance: She is not ill-appearing or diaphoretic.  Neurological:     Mental Status: She is alert and oriented to person, place, and time.  Psychiatric:        Mood and Affect: Mood normal.     Ortho Exam Left knee excellent range of motion without pain.  Right knee positive effusion.  No instability valgus varus stressing.  Both knees with patellofemoral crepitus.  Tenderness along medial lateral joint line of the right knee only.  Calf supple nontender bilateral. Specialty Comments:  No specialty comments available.  Imaging: No results found.   PMFS History: Patient Active Problem List   Diagnosis Date Noted  . Acute pain of right knee 05/17/2020  . Consumes a vegan diet- currently, for now 09/29/2018  . Statin declined 05/26/2018  . Type 2 diabetes mellitus with hypertriglyceridemia ([place]) 04/22/2018  . Type 2 diabetes mellitus without complication, without long-term current use of insulin ([place]) 12/16/2017  . Hypertension associated with diabetes ([place]) 12/16/2017  . Mixed diabetic hyperlipidemia associated with type 2 diabetes mellitus ([place]) 12/16/2017  . Microalbuminuria due to type 2 diabetes  mellitus ([place]) 12/16/2017  . Dysuria 02/08/2017  . Abnormal urinalysis 02/08/2017  . Counseling on health promotion and disease prevention 07/31/2016  . Cervical cancer screening 07/31/2016  . Screening for breast cancer 07/31/2016  . Low serum HDL 10/24/2015  . Menopause present 09/25/2015  . Migraine headache with aura 02/11/2015  . Special screening for malignant neoplasms, colon   . Benign neoplasm of ascending colon   . Benign neoplasm of transverse colon   . Benign neoplasm of sigmoid colon   . Fatty liver disease, nonalcoholic 29/24/4628  . Elevated liver enzymes- from fatty liver disease. 12/10/2014  . Hyperlipidemia 12/10/2014  . Vitamin D deficiency 12/10/2014  . Obesity, Class III, BMI 40-49.9 (morbid obesity) ([place]) 11/21/2014  . Hypothyroidism 11/21/2014  . HTN (hypertension) 11/21/2014  . Hx of papillary thyroid cancer 11/21/2014   Past Medical History:  Diagnosis Date  . Cancer ([place])    thyroid  . Diabetes mellitus without complication ([place])   . GERD (gastroesophageal reflux disease)   . Hyperlipidemia   . Hypertension   . Thyroid disease     Family History  Problem Relation Age of Onset  . Hypertension Mother   . Hypertension Maternal Grandmother   . Stroke Maternal Grandmother   . Colon polyps Neg Hx   . Colon cancer Neg Hx   . Esophageal cancer Neg Hx   . Rectal cancer Neg Hx   . Stomach cancer Neg Hx     Past Surgical History:  Procedure Laterality Date  . COLONOSCOPY  2000   normal  . COLONOSCOPY    . COLONOSCOPY WITH PROPOFOL N/A 01/22/2015   Procedure: COLONOSCOPY WITH PROPOFOL;  Surgeon: Lucilla Lame, MD;  Location: [facility] ENDOSCOPY;  Service: Endoscopy;  Laterality: N/A;  . POLYPECTOMY    . THYROIDECTOMY    . TONSILLECTOMY    . TUBAL LIGATION     Social History   Occupational History  . Not on file  Tobacco Use  . Smoking status: Former Smoker    Quit date: 02/24/1987    Years since quitting: 33.4  . Smokeless tobacco: Never Used   Vaping Use  . Vaping Use: Never used  Substance and Sexual Activity  . Alcohol use: Yes    Alcohol/week: 0.0 standard drinks  . Drug use: No  . Sexual activity: Yes    Birth control/protection: Post-menopausal

## 2020-07-25 ENCOUNTER — Ambulatory Visit: Payer: BC Managed Care – PPO | Admitting: Physician Assistant

## 2020-08-01 ENCOUNTER — Ambulatory Visit: Payer: BC Managed Care – PPO | Admitting: Physician Assistant

## 2020-08-19 ENCOUNTER — Ambulatory Visit: Payer: BC Managed Care – PPO | Admitting: Physician Assistant

## 2020-08-19 ENCOUNTER — Encounter: Payer: Self-pay | Admitting: Physician Assistant

## 2020-08-19 DIAGNOSIS — M25561 Pain in right knee: Secondary | ICD-10-CM | POA: Diagnosis not present

## 2020-08-19 NOTE — Progress Notes (Signed)
                                   HPI: Ms. Klecker returns today for follow-up of her right knee.  She had aspiration injection of the knee on 07/11/2020.  She said she had some good relief for couple days now having swelling in the knee catching giving way and some painful popping.  She feels her knee pain overall is getting worse.  She has had no new injury.  She feels that she cannot trust the knee and has to guard it.  Reviewed of systems: See HPI otherwise negative or noncontributory.  Physical exam: General well-developed well-nourished female no acute distress ambulates without any assistive device. Right knee: Patellofemoral crepitus passive range of motion.  She has full extension actively flexion is full actively.  Positive effusion.  No tenderness along medial lateral joint line.  No instability valgus varus stressing.  Anterior drawer is negative.  Apley's negative.  McMurray's negative.  Impression: Right knee pain with recurrent effusion  Plan: Given patient's recurrent effusion continue mechanical symptoms and radiographs that showed no acute or chronic findings, recommend MRI.  We will see her back after the MRI to go over results discuss further treatment.  Questions encouraged and answered.                                   ++++

## 2020-08-28 ENCOUNTER — Other Ambulatory Visit: Payer: Self-pay

## 2020-08-28 DIAGNOSIS — M25561 Pain in right knee: Secondary | ICD-10-CM

## 2020-08-29 ENCOUNTER — Ambulatory Visit
Admission: RE | Admit: 2020-08-29 | Discharge: 2020-08-29 | Disposition: A | Discharge status: Home / Self Care | Payer: BC Managed Care – PPO | Source: Ambulatory Visit | Attending: Orthopaedic Surgery | Admitting: Orthopaedic Surgery

## 2020-08-29 ENCOUNTER — Other Ambulatory Visit: Payer: Self-pay

## 2020-08-29 DIAGNOSIS — M25561 Pain in right knee: Secondary | ICD-10-CM

## 2020-09-03 ENCOUNTER — Other Ambulatory Visit: Payer: Self-pay | Admitting: Nurse Practitioner

## 2020-09-03 DIAGNOSIS — E039 Hypothyroidism, unspecified: Secondary | ICD-10-CM

## 2020-09-03 DIAGNOSIS — I152 Hypertension secondary to endocrine disorders: Secondary | ICD-10-CM

## 2020-09-03 DIAGNOSIS — E119 Type 2 diabetes mellitus without complications: Secondary | ICD-10-CM

## 2020-09-03 DIAGNOSIS — E559 Vitamin D deficiency, unspecified: Secondary | ICD-10-CM

## 2020-09-03 DIAGNOSIS — Z0000 Encounter for general adult medical examination without abnormal findings: Secondary | ICD-10-CM

## 2020-09-05 ENCOUNTER — Ambulatory Visit: Payer: BC Managed Care – PPO | Admitting: Physician Assistant

## 2020-09-05 ENCOUNTER — Encounter: Payer: Self-pay | Admitting: Physician Assistant

## 2020-09-05 ENCOUNTER — Other Ambulatory Visit: Payer: BC Managed Care – PPO

## 2020-09-05 ENCOUNTER — Other Ambulatory Visit: Payer: Self-pay

## 2020-09-05 DIAGNOSIS — S83241D Other tear of medial meniscus, current injury, right knee, subsequent encounter: Secondary | ICD-10-CM | POA: Diagnosis not present

## 2020-09-05 DIAGNOSIS — E119 Type 2 diabetes mellitus without complications: Secondary | ICD-10-CM

## 2020-09-05 DIAGNOSIS — E039 Hypothyroidism, unspecified: Secondary | ICD-10-CM

## 2020-09-05 DIAGNOSIS — Z0000 Encounter for general adult medical examination without abnormal findings: Secondary | ICD-10-CM

## 2020-09-05 DIAGNOSIS — E1159 Type 2 diabetes mellitus with other circulatory complications: Secondary | ICD-10-CM

## 2020-09-05 DIAGNOSIS — E559 Vitamin D deficiency, unspecified: Secondary | ICD-10-CM

## 2020-09-05 DIAGNOSIS — I152 Hypertension secondary to endocrine disorders: Secondary | ICD-10-CM

## 2020-09-05 NOTE — Progress Notes (Signed)
HPI: Ms. Brazell returns today to go over the MRI of her right knee.  She continues to have pain and swelling in the right knee.  She continues wearing knee brace due to the feeling that the knee may give way on her.  She notes that her knee pain has not improved at all.  She has had pain ongoing since January or early February of this year.  Patient has tried anti-inflammatories , exercise and knee injections.  MRI is reviewed images reviewed with patient and her husband was present today.  MRI shows mild tricompartmental changes knee.  Partial radial tear medial meniscus posterior horn near the root with prominent extrusion of the body.  Small horizontal tear is also in seen in the mid body of the lateral meniscus.  Moderate joint effusion with synovitis also noted.  Impression: Right knee medial meniscal tear   Plan: Given patient's failure of conservative treatment and continued right knee with mechanical symptoms recommend right knee arthroscopy.  Is to be right knee arthroscopy with with extensive debridement and partial medial meniscectomy.  Risk benefits of surgery discussed with patient today at length.  Risk include DVT, infection prolonged pain worsening pain.  Postoperative protocol discussed with patient at length by myself and Dr. Ninfa Linden.  We will see her 1 week postop.

## 2020-09-09 ENCOUNTER — Ambulatory Visit: Payer: BC Managed Care – PPO | Admitting: Physician Assistant

## 2020-09-10 LAB — COMPREHENSIVE METABOLIC PANEL
ALT: 135 IU/L — ABNORMAL HIGH (ref 0–32)
AST: 67 IU/L — ABNORMAL HIGH (ref 0–40)
Albumin/Globulin Ratio: 2.3 — ABNORMAL HIGH (ref 1.2–2.2)
Albumin: 5 g/dL — ABNORMAL HIGH (ref 3.8–4.9)
Alkaline Phosphatase: 64 IU/L (ref 44–121)
BUN/Creatinine Ratio: 14 (ref 9–23)
BUN: 11 mg/dL (ref 6–24)
Bilirubin Total: 0.9 mg/dL (ref 0.0–1.2)
CO2: 16 mmol/L — ABNORMAL LOW (ref 20–29)
Calcium: 10.6 mg/dL — ABNORMAL HIGH (ref 8.7–10.2)
Chloride: 97 mmol/L (ref 96–106)
Creatinine, Ser: 0.79 mg/dL (ref 0.57–1.00)
Globulin, Total: 2.2 g/dL (ref 1.5–4.5)
Glucose: 191 mg/dL — ABNORMAL HIGH (ref 65–99)
Potassium: 4.4 mmol/L (ref 3.5–5.2)
Sodium: 140 mmol/L (ref 134–144)
Total Protein: 7.2 g/dL (ref 6.0–8.5)
eGFR: 87 mL/min/{1.73_m2} (ref >59)

## 2020-09-10 LAB — CBC
Hematocrit: 43.4 % (ref 34.0–46.6)
Hemoglobin: 14.5 g/dL (ref 11.1–15.9)
MCH: 31.7 pg (ref 26.6–33.0)
MCHC: 33.4 g/dL (ref 31.5–35.7)
MCV: 95 fL (ref 79–97)
Platelets: 241 10*3/uL (ref 150–450)
RBC: 4.58 x10E6/uL (ref 3.77–5.28)
RDW: 14.5 % (ref 11.7–15.4)
WBC: 5.9 10*3/uL (ref 3.4–10.8)

## 2020-09-10 LAB — LIPID PANEL
Chol/HDL Ratio: 7.3 ratio — ABNORMAL HIGH (ref 0.0–4.4)
Cholesterol, Total: 220 mg/dL — ABNORMAL HIGH (ref 100–199)
HDL: 30 mg/dL — ABNORMAL LOW (ref >39)
LDL Chol Calc (NIH): 106 mg/dL — ABNORMAL HIGH (ref 0–99)
Triglycerides: 492 mg/dL — ABNORMAL HIGH (ref 0–149)
VLDL Cholesterol Cal: 84 mg/dL — ABNORMAL HIGH (ref 5–40)

## 2020-09-10 LAB — HEMOGLOBIN A1C
Est. average glucose Bld gHb Est-mCnc: 123 mg/dL
Hgb A1c MFr Bld: 5.9 % — ABNORMAL HIGH (ref 4.8–5.6)

## 2020-09-10 LAB — TSH: TSH: 2.29 u[IU]/mL (ref 0.450–4.500)

## 2020-09-10 LAB — VITAMIN D 25 HYDROXY (VIT D DEFICIENCY, FRACTURES): Vit D, 25-Hydroxy: 80.3 ng/mL (ref 30.0–100.0)

## 2020-09-12 ENCOUNTER — Other Ambulatory Visit: Payer: Self-pay | Admitting: Physician Assistant

## 2020-09-12 DIAGNOSIS — E1159 Type 2 diabetes mellitus with other circulatory complications: Secondary | ICD-10-CM

## 2020-09-26 ENCOUNTER — Encounter: Payer: Self-pay | Admitting: Physician Assistant

## 2020-09-26 ENCOUNTER — Other Ambulatory Visit: Payer: Self-pay

## 2020-09-26 ENCOUNTER — Ambulatory Visit: Payer: BC Managed Care – PPO | Admitting: Physician Assistant

## 2020-09-26 VITALS — BP 120/75 | HR 75 | Temp 97.3°F | Ht 68.5 in | Wt 254.8 lb

## 2020-09-26 DIAGNOSIS — E119 Type 2 diabetes mellitus without complications: Secondary | ICD-10-CM | POA: Diagnosis not present

## 2020-09-26 DIAGNOSIS — E1169 Type 2 diabetes mellitus with other specified complication: Secondary | ICD-10-CM | POA: Diagnosis not present

## 2020-09-26 DIAGNOSIS — E039 Hypothyroidism, unspecified: Secondary | ICD-10-CM

## 2020-09-26 DIAGNOSIS — E782 Mixed hyperlipidemia: Secondary | ICD-10-CM

## 2020-09-26 DIAGNOSIS — I152 Hypertension secondary to endocrine disorders: Secondary | ICD-10-CM

## 2020-09-26 DIAGNOSIS — E1159 Type 2 diabetes mellitus with other circulatory complications: Secondary | ICD-10-CM | POA: Diagnosis not present

## 2020-09-26 DIAGNOSIS — Z85850 Personal history of malignant neoplasm of thyroid: Secondary | ICD-10-CM

## 2020-09-26 NOTE — Assessment & Plan Note (Signed)
-  Followed by Endocrinology. -S/p radioactive iodine and resection.

## 2020-09-26 NOTE — Assessment & Plan Note (Addendum)
-  Followed by Endocrinology. -S/p radioactive iodine for tx of papillary carcinoma. -Recent TSH wnl.

## 2020-09-26 NOTE — Assessment & Plan Note (Addendum)
-  A1c mildly increased from 5.8 to 5.9, reviewed most recent endocrinology consult. -Recently started mounjaro and on Metformin. -Encourage to resume low carbohydrate/glucose diet. -Will continue to monitor alongside endocrinology.

## 2020-09-26 NOTE — Progress Notes (Signed)
Established Patient Office Visit  Subjective:  Patient ID: Miranda Delacruz, female    DOB: 01-Mar-1960  Age: 60 y.o. MRN: [id number]  CC:  Chief Complaint  Patient presents with   Follow-up    HPI Miranda Delacruz presents for follow up on diabetes mellitus, hyperlipidemia and lab discussion. Patient states is planning to undergo meniscus repair of right knee.  Diabetes mellitus: Followed by Endocrinology. No increased urination or thirst from baseline. Pt reports medication compliance. No hypoglycemic events. Checking glucose at home. Endocrinologist recently started mounjaro and states has noticed FBS are decreasing from 180s to 160s. Patient is on low dose of 2.5 mg once a week for one month and then will go up to 5 mg. States has not been as good with monitoring her carbohydrates/glucose diet due to personal stress with her husband's health and her knee pain. Has not been sleeping well which usually affects her sugar.  HLD: Pt managing with lifestyle. Currently not as active due her right knee injury.        Past Medical History:  Diagnosis Date   Cancer ([place])    thyroid   Diabetes mellitus without complication ([facility])    GERD (gastroesophageal reflux disease)    Hyperlipidemia    Hypertension    Thyroid disease     Past Surgical History:  Procedure Laterality Date   COLONOSCOPY  2000   normal   COLONOSCOPY     COLONOSCOPY WITH PROPOFOL N/A 01/22/2015   Procedure: COLONOSCOPY WITH PROPOFOL;  Surgeon: Lucilla Lame, MD;  Location: [facility] ENDOSCOPY;  Service: Endoscopy;  Laterality: N/A;   POLYPECTOMY     THYROIDECTOMY     TONSILLECTOMY     TUBAL LIGATION      Family History  Problem Relation Age of Onset   Hypertension Mother    Hypertension Maternal Grandmother    Stroke Maternal Grandmother    Colon polyps Neg Hx    Colon cancer Neg Hx    Esophageal cancer Neg Hx    Rectal cancer Neg Hx    Stomach cancer Neg Hx     Social History   Socioeconomic History    Marital status: Married    Spouse name: Not on file   Number of children: Not on file   Years of education: Not on file   Highest education level: Not on file  Occupational History   Not on file  Tobacco Use   Smoking status: Former    Types: Cigarettes    Quit date: 02/24/1987    Years since quitting: 33.6   Smokeless tobacco: Never  Vaping Use   Vaping Use: Never used  Substance and Sexual Activity   Alcohol use: Yes    Alcohol/week: 0.0 standard drinks   Drug use: No   Sexual activity: Yes    Birth control/protection: Post-menopausal  Other Topics Concern   Not on file  Social History Narrative   Not on file   Social Determinants of Health   Financial Resource Strain: Not on file  Food Insecurity: Not on file  Transportation Needs: Not on file  Physical Activity: Not on file  Stress: Not on file  Social Connections: Not on file  Intimate Partner Violence: Not on file    Outpatient Medications Prior to Visit  Medication Sig Dispense Refill   amLODipine (NORVASC) 5 MG tablet Take 1 tablet by mouth once daily 90 tablet 0   Cholecalciferol (VITAMIN D3) 125 MCG (5000 UT) CAPS      cyclobenzaprine (FLEXERIL)  10 MG tablet Take 1/2 to 1 tablet at bedtime as needed for muscle pain/spasms. 30 tablet 0   diclofenac Sodium (VOLTAREN) 1 % GEL Apply 4 g topically 4 (four) times daily. 100 g 2   etodolac (LODINE) 400 MG tablet Take 1 tablet (400 mg total) by mouth 2 (two) times daily. 60 tablet 1   EUTHYROX 175 MCG tablet Take 175 mcg by mouth every morning.     hydrochlorothiazide (MICROZIDE) 12.5 MG capsule Take 1 capsule by mouth once daily 90 capsule 0   magnesium oxide (MAG-OX) 400 MG tablet Take 2 tablets by mouth daily.     Menaquinone-7 (VITAMIN K2) 100 MCG CAPS      metFORMIN (GLUCOPHAGE) 500 MG tablet TAKE 1 TABLET BY MOUTH TWICE DAILY WITH MEALS 180 tablet 0   metoprolol succinate (TOPROL-XL) 100 MG 24 hr tablet Take 1 tablet by mouth twice daily 180 tablet 0   milk  thistle 175 MG tablet Take 175 mg by mouth daily.     MOUNJARO 2.5 MG/0.5ML Pen SMARTSIG:2.5 Milligram(s) SUB-Q Once a Week     valsartan (DIOVAN) 160 MG tablet Take 1 tablet by mouth once daily 90 tablet 0   vitamin B-12 (CYANOCOBALAMIN) 1000 MCG tablet Take 1,000 mcg by mouth daily.     zinc gluconate 50 MG tablet Take 1 tablet by mouth daily.     No facility-administered medications prior to visit.    No Known Allergies  ROS Review of Systems Review of Systems:  A fourteen system review of systems was performed and found to be positive as per HPI.   Objective:    Physical Exam General:  Well Developed, well nourished, appropriate for stated age.  Neuro:  Alert and oriented,  extra-ocular muscles intact  HEENT:  Normocephalic, atraumatic, neck supple, no carotid bruits appreciated  Skin:  no gross rash, warm, pink. Cardiac:  RRR Respiratory:  CTA B/L, Not using accessory muscles, speaking in full sentences- unlabored. Vascular:  Ext warm, no cyanosis apprec.; cap RF less 2 sec. Psych:  No HI/SI, judgement and insight good, Euthymic mood. Full Affect.  BP 120/75   Pulse 75   Temp (!) 97.3 F (36.3 C)   Ht 5' 8.5" (1.74 m)   Wt 254 lb 12.8 oz (115.6 kg)   SpO2 98%   BMI 38.18 kg/m  Wt Readings from Last 3 Encounters:  09/26/20 254 lb 12.8 oz (115.6 kg)  05/20/20 255 lb (115.7 kg)  05/17/20 264 lb 8 oz (120 kg)     Health Maintenance Due  Topic Date Due   COVID-19 Vaccine (1) Never done   Zoster Vaccines- Shingrix (1 of 2) Never done   Pneumococcal Vaccine 65-61 Years old (2 - PPSV23 or PCV20) 12/01/2017   FOOT EXAM  12/17/2018   OPHTHALMOLOGY EXAM  01/24/2020   INFLUENZA VACCINE  09/23/2020    There are no preventive care reminders to display for this patient.  Lab Results  Component Value Date   TSH 2.290 09/05/2020   Lab Results  Component Value Date   WBC 5.9 09/05/2020   HGB 14.5 09/05/2020   HCT 43.4 09/05/2020   MCV 95 09/05/2020   PLT 241  09/05/2020   Lab Results  Component Value Date   NA 140 09/05/2020   K 4.4 09/05/2020   CO2 16 (L) 09/05/2020   GLUCOSE 191 (H) 09/05/2020   BUN 11 09/05/2020   CREATININE 0.79 09/05/2020   BILITOT 0.9 09/05/2020   ALKPHOS 64 09/05/2020  AST 67 (H) 09/05/2020   ALT 135 (H) 09/05/2020   PROT 7.2 09/05/2020   ALBUMIN 5.0 (H) 09/05/2020   CALCIUM 10.6 (H) 09/05/2020   EGFR 87 09/05/2020   Lab Results  Component Value Date   CHOL 220 (H) 09/05/2020   Lab Results  Component Value Date   HDL 30 (L) 09/05/2020   Lab Results  Component Value Date   LDLCALC 106 (H) 09/05/2020   Lab Results  Component Value Date   TRIG 492 (H) 09/05/2020   Lab Results  Component Value Date   CHOLHDL 7.3 (H) 09/05/2020   Lab Results  Component Value Date   HGBA1C 5.9 (H) 09/05/2020      Assessment & Plan:   Problem List Items Addressed This Visit       Cardiovascular and Mediastinum   Hypertension associated with diabetes ([place])    -Controlled. -Continue current medication regimen. -Recent CMP, renal function/sodium/potassium normal. Calcium mildly elevated likely related to medication. -Will continue to monitor.       Relevant Medications   MOUNJARO 2.5 MG/0.5ML Pen     Endocrine   Hypothyroidism ([facility])    -Followed by Endocrinology. -S/p radioactive iodine for tx of papillary carcinoma. -Recent TSH wnl.       Type 2 diabetes mellitus without complication, without long-term current use of insulin ([facility]) - Primary    -A1c mildly increased from 5.8 to 5.9, reviewed most recent endocrinology consult. -Recently started mounjaro and on Metformin. -Encourage to resume low carbohydrate/glucose diet. -Will continue to monitor alongside endocrinology.       Relevant Medications   MOUNJARO 2.5 MG/0.5ML Pen   Mixed diabetic hyperlipidemia associated with type 2 diabetes mellitus ([place])    -Discussed recent lipid panel which has overall improved with the exception of  triglycerides. Patient will continue to manage with low fat diet and lifestyle changes. Recommend to reduce simple carbohydrates. -Will continue to monitor.       Relevant Medications   MOUNJARO 2.5 MG/0.5ML Pen     Other   Hx of papillary thyroid cancer (Chronic)    -Followed by Endocrinology. -S/p radioactive iodine and resection.         No orders of the defined types were placed in this encounter.   Follow-up: Return for DM, HTN, HLD in 4-6 months .    Lorrene Reid, PA-C

## 2020-09-26 NOTE — Assessment & Plan Note (Signed)
-  Discussed recent lipid panel which has overall improved with the exception of triglycerides. Patient will continue to manage with low fat diet and lifestyle changes. Recommend to reduce simple carbohydrates. -Will continue to monitor.

## 2020-09-26 NOTE — Assessment & Plan Note (Addendum)
-  Controlled. -Continue current medication regimen. -Recent CMP, renal function/sodium/potassium normal. Calcium mildly elevated likely related to medication (on thiazide diuretic and ARB) -Will continue to monitor.

## 2020-10-10 ENCOUNTER — Other Ambulatory Visit: Payer: Self-pay | Admitting: Orthopaedic Surgery

## 2020-10-10 DIAGNOSIS — S83231D Complex tear of medial meniscus, current injury, right knee, subsequent encounter: Secondary | ICD-10-CM | POA: Diagnosis not present

## 2020-10-10 MED ORDER — HYDROCODONE-ACETAMINOPHEN 5-325 MG PO TABS: 1.0000 | ORAL_TABLET | Freq: Four times a day (QID) | ORAL | 0 refills | Status: DC | PRN

## 2020-10-17 ENCOUNTER — Encounter: Payer: Self-pay | Admitting: Orthopaedic Surgery

## 2020-10-17 ENCOUNTER — Other Ambulatory Visit: Payer: Self-pay

## 2020-10-17 ENCOUNTER — Ambulatory Visit (INDEPENDENT_AMBULATORY_CARE_PROVIDER_SITE_OTHER): Payer: BC Managed Care – PPO | Admitting: Orthopaedic Surgery

## 2020-10-17 DIAGNOSIS — S83241D Other tear of medial meniscus, current injury, right knee, subsequent encounter: Secondary | ICD-10-CM

## 2020-10-17 DIAGNOSIS — Z9889 Other specified postprocedural states: Secondary | ICD-10-CM

## 2020-10-17 NOTE — Progress Notes (Signed)
The patient is 1 week out from a right knee arthroscopy.  She had a tear of the posterior horn root of the medial meniscus and some degenerative fraying of the lateral meniscus.  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  There was no exposed bone medial or lateral.  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  The ACL PCL were intact.  This is the knee that I think still has a lot of life and it in terms of her getting her quad strong and avoiding knee replacement surgery.  We talked about weight loss and activity modification as well.  Examination of her right knee shows a moderate effusion postoperative.  She is bending it well but certainly has pain in the posterior aspect of the knee with flexion extension.  Her calf is soft.  She is walking with just a slight limp and no assistive device.  I recommended in the office today aspirating her right knee to get all the fluid out of it that we can in placing a steroid injection in the knee and she agrees with this treatment plan.  We also talked about increasing her activities but avoiding high impact aerobic activities.  I was able to aspirate 20 to 30 cc of fluid off the knee and this gave her much more comfort and flexibility.  We will see her back in 4 weeks to see how she is doing overall.  All questions and concerns were answered and addressed.

## 2020-11-08 ENCOUNTER — Encounter: Payer: Self-pay | Admitting: Orthopaedic Surgery

## 2020-11-08 NOTE — Telephone Encounter (Signed)
Would you rather see her on Monday or push back for two weeks at next available?

## 2020-11-11 ENCOUNTER — Encounter: Payer: BC Managed Care – PPO | Admitting: Orthopaedic Surgery

## 2020-11-14 ENCOUNTER — Encounter: Payer: BC Managed Care – PPO | Admitting: Orthopaedic Surgery

## 2020-11-25 ENCOUNTER — Ambulatory Visit (INDEPENDENT_AMBULATORY_CARE_PROVIDER_SITE_OTHER): Payer: BC Managed Care – PPO | Admitting: Orthopaedic Surgery

## 2020-11-25 ENCOUNTER — Other Ambulatory Visit: Payer: Self-pay

## 2020-11-25 ENCOUNTER — Encounter: Payer: Self-pay | Admitting: Orthopaedic Surgery

## 2020-11-25 DIAGNOSIS — Z9889 Other specified postprocedural states: Secondary | ICD-10-CM

## 2020-11-25 NOTE — Progress Notes (Signed)
The patient comes in today in follow-up after having a right knee arthroscopy.  Have seen her once postoperative prior to this visit.  At that visit we drained fluid off her knee and placed a steroid in the knee.  She reports increased motion and strength since then.  She says she still has a small amount of swelling with the right knee but overall is satisfied and doing well.  On exam there is just a little bit of swelling with the right knee.  Her right knee does flex and extend well and has good rotation.  I had a long and thorough discussion with her about her right knee.  She knows to still avoid high impact aerobic activities until she feels much better.  All questions and concerns were answered and addressed.  Follow-up can be as needed.

## 2020-11-30 ENCOUNTER — Other Ambulatory Visit: Payer: Self-pay | Admitting: Nurse Practitioner

## 2020-11-30 DIAGNOSIS — M25561 Pain in right knee: Secondary | ICD-10-CM

## 2020-12-25 DIAGNOSIS — C73 Malignant neoplasm of thyroid gland: Secondary | ICD-10-CM | POA: Diagnosis not present

## 2020-12-30 ENCOUNTER — Other Ambulatory Visit: Payer: Self-pay | Admitting: Physician Assistant

## 2020-12-30 DIAGNOSIS — E1159 Type 2 diabetes mellitus with other circulatory complications: Secondary | ICD-10-CM

## 2020-12-30 DIAGNOSIS — I152 Hypertension secondary to endocrine disorders: Secondary | ICD-10-CM

## 2021-01-14 ENCOUNTER — Other Ambulatory Visit: Payer: Self-pay | Admitting: Nurse Practitioner

## 2021-01-14 DIAGNOSIS — M25561 Pain in right knee: Secondary | ICD-10-CM

## 2021-01-23 ENCOUNTER — Encounter: Payer: Self-pay | Admitting: Nurse Practitioner

## 2021-01-23 ENCOUNTER — Other Ambulatory Visit: Payer: Self-pay

## 2021-01-23 ENCOUNTER — Ambulatory Visit: Payer: BC Managed Care – PPO | Admitting: Nurse Practitioner

## 2021-01-23 VITALS — BP 118/76 | HR 76 | Temp 98.5°F | Ht 68.5 in | Wt 261.7 lb

## 2021-01-23 DIAGNOSIS — M791 Myalgia, unspecified site: Secondary | ICD-10-CM | POA: Diagnosis not present

## 2021-01-23 DIAGNOSIS — M25552 Pain in left hip: Secondary | ICD-10-CM | POA: Diagnosis not present

## 2021-01-23 DIAGNOSIS — G8929 Other chronic pain: Secondary | ICD-10-CM

## 2021-01-23 MED ORDER — METHYLPREDNISOLONE 4 MG PO TBPK: ORAL_TABLET | ORAL | 0 refills | Status: DC

## 2021-01-23 MED ORDER — CYCLOBENZAPRINE HCL 10 MG PO TABS: ORAL_TABLET | ORAL | 0 refills | Status: DC

## 2021-01-23 NOTE — Patient Instructions (Signed)
Hip Pain The hip is the joint between the upper legs and the lower pelvis. The bones, cartilage, tendons, and muscles of your hip joint support your body and allow you to move around. Hip pain can range from a minor ache to severe pain in one or both of your hips. The pain may be felt on the inside of the hip joint near the groin, or on the outside near the buttocks and upper thigh. You may also have swelling or stiffness in your hip area. Follow these instructions at home: Managing pain, stiffness, and swelling   If directed, put ice on the painful area. To do this: Put ice in a plastic bag. Place a towel between your skin and the bag. Leave the ice on for 20 minutes, 2-3 times a day. If directed, apply heat to the affected area as often as told by your health care provider. Use the heat source that your health care provider recommends, such as a moist heat pack or a heating pad. Place a towel between your skin and the heat source. Leave the heat on for 20-30 minutes. Remove the heat if your skin turns bright red. This is especially important if you are unable to feel pain, heat, or cold. You may have a greater risk of getting burned. Activity Do exercises as told by your health care provider. Avoid activities that cause pain. General instructions  Take over-the-counter and prescription medicines only as told by your health care provider. Keep a journal of your symptoms. Write down: How often you have hip pain. The location of your pain. What the pain feels like. What makes the pain worse. Sleep with a pillow between your legs on your most comfortable side. Keep all follow-up visits as told by your health care provider. This is important. Contact a health care provider if: You cannot put weight on your leg. Your pain or swelling continues or gets worse after one week. It gets harder to walk. You have a fever. Get help right away if: You fall. You have a sudden increase in pain and  swelling in your hip. Your hip is red or swollen or very tender to touch. Summary Hip pain can range from a minor ache to severe pain in one or both of your hips. The pain may be felt on the inside of the hip joint near the groin, or on the outside near the buttocks and upper thigh. Avoid activities that cause pain. Write down how often you have hip pain, the location of the pain, what makes it worse, and what it feels like. This information is not intended to replace advice given to you by your health care provider. Make sure you discuss any questions you have with your health care provider. Document Revised: 06/27/2018 Document Reviewed: 06/27/2018 Elsevier Patient Education  2022 [employer]  

## 2021-01-23 NOTE — Progress Notes (Signed)
Acute Office Visit  Subjective:    Patient ID: Miranda Delacruz, female    DOB: October 05, 1960, 60 y.o.   MRN: [id number]  Chief Complaint  Patient presents with   Hip Pain    Hip Pain  The incident occurred more than 1 week ago. The incident occurred at home. There was no injury mechanism. The pain is present in the left hip and left thigh. The quality of the pain is described as cramping, shooting and stabbing. The pain is moderate. The pain has been Worsening since onset. Associated symptoms include an inability to bear weight, a loss of motion and tingling. She reports no foreign bodies present. The symptoms are aggravated by movement. She has tried non-weight bearing, NSAIDs and acetaminophen for the symptoms. The treatment provided mild relief.    Past Medical History:  Diagnosis Date   Cancer ([place])    thyroid   Diabetes mellitus without complication (HCC)    GERD (gastroesophageal reflux disease)    Hyperlipidemia    Hypertension    Thyroid disease     Past Surgical History:  Procedure Laterality Date   COLONOSCOPY  2000   normal   COLONOSCOPY     COLONOSCOPY WITH PROPOFOL N/A 01/22/2015   Procedure: COLONOSCOPY WITH PROPOFOL;  Surgeon: Lucilla Lame, MD;  Location: [facility] ENDOSCOPY;  Service: Endoscopy;  Laterality: N/A;   POLYPECTOMY     THYROIDECTOMY     TONSILLECTOMY     TUBAL LIGATION      Family History  Problem Relation Age of Onset   Hypertension Mother    Hypertension Maternal Grandmother    Stroke Maternal Grandmother    Colon polyps Neg Hx    Colon cancer Neg Hx    Esophageal cancer Neg Hx    Rectal cancer Neg Hx    Stomach cancer Neg Hx     Social History   Socioeconomic History   Marital status: Married    Spouse name: Not on file   Number of children: Not on file   Years of education: Not on file   Highest education level: Not on file  Occupational History   Not on file  Tobacco Use   Smoking status: Former    Types: Cigarettes    Quit  date: 02/24/1987    Years since quitting: 33.9   Smokeless tobacco: Never  Vaping Use   Vaping Use: Never used  Substance and Sexual Activity   Alcohol use: Yes    Alcohol/week: 0.0 standard drinks   Drug use: No   Sexual activity: Yes    Birth control/protection: Post-menopausal  Other Topics Concern   Not on file  Social History Narrative   Not on file   Social Determinants of Health   Financial Resource Strain: Not on file  Food Insecurity: Not on file  Transportation Needs: Not on file  Physical Activity: Not on file  Stress: Not on file  Social Connections: Not on file  Intimate Partner Violence: Not on file    Outpatient Medications Prior to Visit  Medication Sig Dispense Refill   amLODipine (NORVASC) 5 MG tablet Take 1 tablet by mouth once daily 90 tablet 1   Cholecalciferol (VITAMIN D3) 125 MCG (5000 UT) CAPS      diclofenac Sodium (VOLTAREN) 1 % GEL Apply 4 g topically 4 (four) times daily. 100 g 2   etodolac (LODINE) 400 MG tablet Take 1 tablet by mouth twice daily 60 tablet 0   EUTHYROX 175 MCG tablet Take 175 mcg  by mouth every morning.     hydrochlorothiazide (MICROZIDE) 12.5 MG capsule Take 1 capsule by mouth once daily 90 capsule 1   magnesium oxide (MAG-OX) 400 MG tablet Take 2 tablets by mouth daily.     Menaquinone-7 (VITAMIN K2) 100 MCG CAPS      metFORMIN (GLUCOPHAGE) 500 MG tablet TAKE 1 TABLET BY MOUTH TWICE DAILY WITH MEALS 180 tablet 0   metoprolol succinate (TOPROL-XL) 100 MG 24 hr tablet Take 1 tablet by mouth twice daily 180 tablet 1   milk thistle 175 MG tablet Take 175 mg by mouth daily.     MOUNJARO 2.5 MG/0.5ML Pen SMARTSIG:2.5 Milligram(s) SUB-Q Once a Week     valsartan (DIOVAN) 160 MG tablet Take 1 tablet by mouth once daily 90 tablet 1   vitamin B-12 (CYANOCOBALAMIN) 1000 MCG tablet Take 1,000 mcg by mouth daily.     zinc gluconate 50 MG tablet Take 1 tablet by mouth daily.     cyclobenzaprine (FLEXERIL) 10 MG tablet Take 1/2 to 1 tablet at  bedtime as needed for muscle pain/spasms. 30 tablet 0   HYDROcodone-acetaminophen (NORCO/VICODIN) 5-325 MG tablet Take 1-2 tablets by mouth every 6 (six) hours as needed for moderate pain. 30 tablet 0   No facility-administered medications prior to visit.    No Known Allergies  Review of Systems  Constitutional:  Positive for activity change and fatigue. Negative for appetite change, chills and fever.  HENT:  Negative for congestion, postnasal drip, rhinorrhea, sinus pressure, sinus pain, sneezing and sore throat.   Eyes: Negative.   Respiratory:  Negative for cough, chest tightness, shortness of breath and wheezing.   Cardiovascular:  Negative for chest pain and palpitations.  Gastrointestinal:  Negative for abdominal pain, constipation, diarrhea, nausea and vomiting.  Endocrine: Negative for cold intolerance, heat intolerance, polydipsia and polyuria.  Genitourinary:  Negative for dyspareunia, dysuria, flank pain, frequency and urgency.  Musculoskeletal:  Positive for arthralgias, gait problem and myalgias. Negative for back pain.  Skin:  Negative for rash.  Allergic/Immunologic: Negative for environmental allergies.  Neurological:  Positive for tingling and weakness. Negative for dizziness and headaches.  Hematological:  Negative for adenopathy.  Psychiatric/Behavioral:  The patient is not nervous/anxious.       Objective:    Physical Exam Vitals and nursing note reviewed.  Constitutional:      Appearance: Normal appearance. She is well-developed.  HENT:     Head: Normocephalic and atraumatic.  Eyes:     Pupils: Pupils are equal, round, and reactive to light.  Cardiovascular:     Rate and Rhythm: Normal rate and regular rhythm.     Pulses: Normal pulses.     Heart sounds: Normal heart sounds.  Pulmonary:     Effort: Pulmonary effort is normal.     Breath sounds: Normal breath sounds.  Abdominal:     Palpations: Abdomen is soft.  Musculoskeletal:        General:  Tenderness present. Normal range of motion.     Cervical back: Normal range of motion and neck supple.       Legs:  Lymphadenopathy:     Cervical: No cervical adenopathy.  Skin:    General: Skin is warm and dry.     Capillary Refill: Capillary refill takes less than 2 seconds.  Neurological:     General: No focal deficit present.     Mental Status: She is alert and oriented to person, place, and time.  Psychiatric:  Mood and Affect: Mood normal.        Behavior: Behavior normal.        Thought Content: Thought content normal.        Judgment: Judgment normal.    Today's Vitals   01/23/21 1113  BP: 118/76  Pulse: 76  Temp: 98.5 F (36.9 C)  SpO2: 98%  Weight: 261 lb 11.2 oz (118.7 kg)  Height: 5' 8.5" (1.74 m)   Body mass index is 39.21 kg/m.   Wt Readings from Last 3 Encounters:  01/23/21 261 lb 11.2 oz (118.7 kg)  09/26/20 254 lb 12.8 oz (115.6 kg)  05/20/20 255 lb (115.7 kg)    Health Maintenance Due  Topic Date Due   Pneumococcal Vaccine 80-106 Years old (2 - PPSV23 if available, else PCV20) 12/01/2017   FOOT EXAM  12/17/2018   OPHTHALMOLOGY EXAM  01/24/2020    There are no preventive care reminders to display for this patient.   Lab Results  Component Value Date   TSH 2.290 09/05/2020   Lab Results  Component Value Date   WBC 5.9 09/05/2020   HGB 14.5 09/05/2020   HCT 43.4 09/05/2020   MCV 95 09/05/2020   PLT 241 09/05/2020   Lab Results  Component Value Date   NA 140 09/05/2020   K 4.4 09/05/2020   CO2 16 (L) 09/05/2020   GLUCOSE 191 (H) 09/05/2020   BUN 11 09/05/2020   CREATININE 0.79 09/05/2020   BILITOT 0.9 09/05/2020   ALKPHOS 64 09/05/2020   AST 67 (H) 09/05/2020   ALT 135 (H) 09/05/2020   PROT 7.2 09/05/2020   ALBUMIN 5.0 (H) 09/05/2020   CALCIUM 10.6 (H) 09/05/2020   EGFR 87 09/05/2020   Lab Results  Component Value Date   CHOL 220 (H) 09/05/2020   Lab Results  Component Value Date   HDL 30 (L) 09/05/2020   Lab  Results  Component Value Date   LDLCALC 106 (H) 09/05/2020   Lab Results  Component Value Date   TRIG 492 (H) 09/05/2020   Lab Results  Component Value Date   CHOLHDL 7.3 (H) 09/05/2020   Lab Results  Component Value Date   HGBA1C 5.9 (H) 09/05/2020       Assessment & Plan:  1. Acute hip pain, left Continue to use over-the-counter Tylenol and NSAIDs to help relieve pain.  Start Medrol Dosepak.  Take as directed for 6 days.  Recommend rest apply heat to the left hip to further reduce pain.  Consider x-ray if no improvement external days. - methylPREDNISolone (MEDROL) 4 MG TBPK tablet; Take by mouth as directed for 6 days  Dispense: 21 tablet; Refill: 0  2. Muscle pain May take Flexeril 10 mg, 1/2 to 1 tablet at bedtime as needed to help reduce muscle pain and tightness. - cyclobenzaprine (FLEXERIL) 10 MG tablet; Take 1/2 to 1 tablet at bedtime as needed for muscle pain/spasms.  Dispense: 30 tablet; Refill: 0   Problem List Items Addressed This Visit       Other   Acute hip pain, left - Primary   Relevant Medications   methylPREDNISolone (MEDROL) 4 MG TBPK tablet   Muscle pain   Relevant Medications   cyclobenzaprine (FLEXERIL) 10 MG tablet     Meds ordered this encounter  Medications   methylPREDNISolone (MEDROL) 4 MG TBPK tablet    Sig: Take by mouth as directed for 6 days    Dispense:  21 tablet    Refill:  0  Order Specific Question:   Supervising Provider    Answer:   Beatrice Lecher D [[id number]]   cyclobenzaprine (FLEXERIL) 10 MG tablet    Sig: Take 1/2 to 1 tablet at bedtime as needed for muscle pain/spasms.    Dispense:  30 tablet    Refill:  0    Order Specific Question:   Supervising Provider    Answer:   Beatrice Lecher D [[id number]]     Ronnell Freshwater, NP  This note was dictated using Dragon Voice Recognition Software. Rapid proofreading was performed to expedite the delivery of the information. Despite proofreading, phonetic errors will occur  which are common with this voice recognition software. Please take this into consideration. If there are any concerns, please contact our office.

## 2021-01-28 ENCOUNTER — Other Ambulatory Visit: Payer: Self-pay | Admitting: Nurse Practitioner

## 2021-01-28 ENCOUNTER — Encounter: Payer: Self-pay | Admitting: Nurse Practitioner

## 2021-01-28 DIAGNOSIS — M25552 Pain in left hip: Secondary | ICD-10-CM

## 2021-01-29 ENCOUNTER — Telehealth: Payer: Self-pay | Admitting: Nurse Practitioner

## 2021-01-29 ENCOUNTER — Other Ambulatory Visit: Payer: Self-pay

## 2021-01-29 ENCOUNTER — Other Ambulatory Visit: Payer: Self-pay | Admitting: Nurse Practitioner

## 2021-01-29 ENCOUNTER — Ambulatory Visit
Admission: RE | Admit: 2021-01-29 | Discharge: 2021-01-29 | Disposition: A | Discharge status: Home / Self Care | Payer: BC Managed Care – PPO | Source: Ambulatory Visit | Attending: Nurse Practitioner | Admitting: Nurse Practitioner

## 2021-01-29 DIAGNOSIS — M791 Myalgia, unspecified site: Secondary | ICD-10-CM | POA: Insufficient documentation

## 2021-01-29 DIAGNOSIS — M48061 Spinal stenosis, lumbar region without neurogenic claudication: Secondary | ICD-10-CM | POA: Diagnosis not present

## 2021-01-29 DIAGNOSIS — M25552 Pain in left hip: Secondary | ICD-10-CM | POA: Diagnosis not present

## 2021-01-29 DIAGNOSIS — M5432 Sciatica, left side: Secondary | ICD-10-CM

## 2021-01-29 DIAGNOSIS — M545 Low back pain, unspecified: Secondary | ICD-10-CM | POA: Diagnosis not present

## 2021-01-29 NOTE — Telephone Encounter (Signed)
Patient called and stated she was going to get a hip x-ray however she wants to know if the order can be changed to x-ray lower back due to the location of pain. Please advise. [phone]

## 2021-01-29 NOTE — Telephone Encounter (Signed)
Called pt LVM stating that another x-ray was added to her referral

## 2021-01-29 NOTE — Telephone Encounter (Signed)
I didn't change the x-ray, but I did add x-ray of the lumbar spine to the left hip x-ray. Orders for both are in Epic

## 2021-01-30 ENCOUNTER — Other Ambulatory Visit: Payer: Self-pay | Admitting: Nurse Practitioner

## 2021-01-30 DIAGNOSIS — M5432 Sciatica, left side: Secondary | ICD-10-CM

## 2021-01-30 DIAGNOSIS — M5116 Intervertebral disc disorders with radiculopathy, lumbar region: Secondary | ICD-10-CM

## 2021-01-30 NOTE — Progress Notes (Signed)
I have just placed the order for orthopedics for her.

## 2021-01-30 NOTE — Progress Notes (Signed)
Please let the patient know that x-ray of left hip looks ok. There are some mild degenerative changes noted in the lumbar spine. I can refer her to orthopedics if she would like. Also, there was incidental finding of possible gallstone. This is something we can watch. Thank you.

## 2021-01-30 NOTE — Progress Notes (Signed)
Normal hip x-ray. Dgen changes I nlow back. Patient referred to orthopedics for further evaluation

## 2021-02-10 ENCOUNTER — Ambulatory Visit: Payer: BC Managed Care – PPO | Admitting: Physician Assistant

## 2021-02-13 ENCOUNTER — Other Ambulatory Visit: Payer: Self-pay

## 2021-02-13 ENCOUNTER — Ambulatory Visit (INDEPENDENT_AMBULATORY_CARE_PROVIDER_SITE_OTHER): Payer: BC Managed Care – PPO | Admitting: Nurse Practitioner

## 2021-02-13 ENCOUNTER — Encounter: Payer: Self-pay | Admitting: Nurse Practitioner

## 2021-02-13 VITALS — Ht 68.5 in | Wt 256.0 lb

## 2021-02-13 DIAGNOSIS — Z85850 Personal history of malignant neoplasm of thyroid: Secondary | ICD-10-CM

## 2021-02-13 DIAGNOSIS — E119 Type 2 diabetes mellitus without complications: Secondary | ICD-10-CM

## 2021-02-13 DIAGNOSIS — E039 Hypothyroidism, unspecified: Secondary | ICD-10-CM

## 2021-02-13 MED ORDER — EMPAGLIFLOZIN 10 MG PO TABS: 10.0000 mg | ORAL_TABLET | Freq: Every day | ORAL | 2 refills | Status: DC

## 2021-02-13 NOTE — Progress Notes (Signed)
Virtual Visit via Telephone Note  I connected with [name] on 02/13/21 at 10:50 AM EST by telephone and verified that I am speaking with the correct person using two identifiers.  Location: Patient: home Provider: home office    I discussed the limitations, risks, security and privacy concerns of performing an evaluation and management service by telephone and the availability of in person appointments. I also discussed with the patient that there may be a patient responsible charge related to this service. The patient expressed understanding and agreed to proceed.   History of Present Illness: The patient states that she recently stopped taking Mounjaro as she read the potential side effects. She also states that she wasn't having weight loss effects as expected by her endocrinologist. She states that, since then, her blood sugars have been spiking. They are worse in the mornings. States that they are as high as 200. She continues to take metformin twice daily and states that this is not helping her blood sugars. She states that her blood sugars have been elevated for the past few weeks. They are unchanged. She also states that she has a history of thyroid papillary carcinoma. She currently is on euthyroid 190mg daily. Her last TSH levels were within normal limits. She is concerned as she did not have thyroglobulin levels checked recently. She is considering a new endocrinologist.   Observations/Objective:  The patient is alert and oriented. She is pleasant and answers all questions appropriately. Breathing is non-labored. She is in no acute distress at this time.    Today's Vitals   02/13/21 1002  Weight: 256 lb (116.1 kg)  Height: 5' 8.5" (1.74 m)   Body mass index is 38.36 kg/m.   Assessment and Plan: 1. Type 2 diabetes mellitus without complication, without long-term current use of insulin (HCC) Add jardiance 155mdaily. Continue metformin 50065mwice daily. Check blood  sugars routinely. The goal is to have fasting blood sugars between 70 and 110. Will check CMP and HgbA1c and adjust medication as indicated.  - empagliflozin (JARDIANCE) 10 MG TABS tablet; Take 1 tablet (10 mg total) by mouth daily before breakfast.  Dispense: 30 tablet; Refill: 2 - Comp Met (CMET); Future - HgB A1c; Future  2. Hypothyroidism, unspecified type Check thyroid panel and thyroglobulin level for surveillance. Adjust dosing of euthyroid as indicated. Refer to new endocrinologist if indicated.  - TSH + free T4; Future - Thyroglobulin antibody; Future - Comp Met (CMET); Future  3. Hx of papillary thyroid cancer Check thyroid panel and thyroglobulin level for surveillance.  - TSH + free T4; Future - Thyroglobulin antibody; Future   Follow Up Instructions:    I discussed the assessment and treatment plan with the patient. The patient was provided an opportunity to ask questions and all were answered. The patient agreed with the plan and demonstrated an understanding of the instructions.   The patient was advised to call back or seek an in-person evaluation if the symptoms worsen or if the condition fails to improve as anticipated.  I provided 15 minutes of non-face-to-face time during this encounter.   HeaRonnell FreshwaterP

## 2021-02-19 ENCOUNTER — Other Ambulatory Visit: Payer: BC Managed Care – PPO

## 2021-02-19 ENCOUNTER — Other Ambulatory Visit: Payer: Self-pay

## 2021-02-19 DIAGNOSIS — Z85850 Personal history of malignant neoplasm of thyroid: Secondary | ICD-10-CM

## 2021-02-19 DIAGNOSIS — E119 Type 2 diabetes mellitus without complications: Secondary | ICD-10-CM

## 2021-02-19 DIAGNOSIS — E039 Hypothyroidism, unspecified: Secondary | ICD-10-CM

## 2021-02-21 ENCOUNTER — Other Ambulatory Visit: Payer: Self-pay

## 2021-02-21 ENCOUNTER — Other Ambulatory Visit: Payer: BC Managed Care – PPO

## 2021-02-21 DIAGNOSIS — E039 Hypothyroidism, unspecified: Secondary | ICD-10-CM | POA: Diagnosis not present

## 2021-02-21 DIAGNOSIS — E119 Type 2 diabetes mellitus without complications: Secondary | ICD-10-CM | POA: Diagnosis not present

## 2021-02-21 DIAGNOSIS — Z85850 Personal history of malignant neoplasm of thyroid: Secondary | ICD-10-CM | POA: Diagnosis not present

## 2021-02-22 LAB — COMPREHENSIVE METABOLIC PANEL
ALT: 116 IU/L — ABNORMAL HIGH (ref 0–32)
AST: 71 IU/L — ABNORMAL HIGH (ref 0–40)
Albumin/Globulin Ratio: 2.9 — ABNORMAL HIGH (ref 1.2–2.2)
Albumin: 4.7 g/dL (ref 3.8–4.9)
Alkaline Phosphatase: 61 IU/L (ref 44–121)
BUN/Creatinine Ratio: 17 (ref 9–23)
BUN: 11 mg/dL (ref 6–24)
Bilirubin Total: 1.1 mg/dL (ref 0.0–1.2)
CO2: 21 mmol/L (ref 20–29)
Calcium: 9 mg/dL (ref 8.7–10.2)
Chloride: 99 mmol/L (ref 96–106)
Creatinine, Ser: 0.66 mg/dL (ref 0.57–1.00)
Globulin, Total: 1.6 g/dL (ref 1.5–4.5)
Glucose: 179 mg/dL — ABNORMAL HIGH (ref 70–99)
Potassium: 4 mmol/L (ref 3.5–5.2)
Sodium: 137 mmol/L (ref 134–144)
Total Protein: 6.3 g/dL (ref 6.0–8.5)
eGFR: 102 mL/min/{1.73_m2} (ref >59)

## 2021-02-22 LAB — TSH+FREE T4
Free T4: 1.95 ng/dL — ABNORMAL HIGH (ref 0.82–1.77)
TSH: 0.255 u[IU]/mL — ABNORMAL LOW (ref 0.450–4.500)

## 2021-02-22 LAB — HEMOGLOBIN A1C
Est. average glucose Bld gHb Est-mCnc: 131 mg/dL
Hgb A1c MFr Bld: 6.2 % — ABNORMAL HIGH (ref 4.8–5.6)

## 2021-02-22 LAB — THYROGLOBULIN ANTIBODY: Thyroglobulin Antibody: <1 IU/mL (ref 0.0–0.9)

## 2021-02-25 ENCOUNTER — Other Ambulatory Visit: Payer: Self-pay | Admitting: Nurse Practitioner

## 2021-02-25 ENCOUNTER — Encounter: Payer: Self-pay | Admitting: Nurse Practitioner

## 2021-02-25 DIAGNOSIS — E039 Hypothyroidism, unspecified: Secondary | ICD-10-CM

## 2021-02-25 DIAGNOSIS — E119 Type 2 diabetes mellitus without complications: Secondary | ICD-10-CM

## 2021-02-25 DIAGNOSIS — K760 Fatty (change of) liver, not elsewhere classified: Secondary | ICD-10-CM

## 2021-02-25 MED ORDER — SITAGLIPTIN PHOSPHATE 25 MG PO TABS: 25.0000 mg | ORAL_TABLET | Freq: Every day | ORAL | 2 refills | Status: DC

## 2021-02-25 MED ORDER — LEVOTHYROXINE SODIUM 150 MCG PO TABS: 150.0000 ug | ORAL_TABLET | Freq: Every day | ORAL | 3 refills | Status: DC

## 2021-02-25 NOTE — Progress Notes (Signed)
Please let the patient know that her labs indicate that she has too igh a dose of thyroid medication. I have lowered the dose to 129mg and sent new prescription to her pharmacy. Also, her liver functions are elevated. They appear to be stable from previous checks. There hasn't been any imaging done of her liver in last few years, so I have ordered an ultrasound of the abdomen for further evaluation. Other labs look good.  Thanks so much.   -HB

## 2021-02-25 NOTE — Progress Notes (Signed)
Ordered abdominal ultrasound due to chorinc elevation of liver enzymes.

## 2021-03-03 ENCOUNTER — Encounter: Payer: Self-pay | Admitting: Nurse Practitioner

## 2021-03-11 ENCOUNTER — Ambulatory Visit
Admission: RE | Admit: 2021-03-11 | Discharge: 2021-03-11 | Disposition: A | Discharge status: Home / Self Care | Payer: BC Managed Care – PPO | Source: Ambulatory Visit | Attending: Nurse Practitioner | Admitting: Nurse Practitioner

## 2021-03-11 DIAGNOSIS — K7460 Unspecified cirrhosis of liver: Secondary | ICD-10-CM | POA: Diagnosis not present

## 2021-03-11 DIAGNOSIS — R748 Abnormal levels of other serum enzymes: Secondary | ICD-10-CM | POA: Diagnosis not present

## 2021-03-11 DIAGNOSIS — K760 Fatty (change of) liver, not elsewhere classified: Secondary | ICD-10-CM

## 2021-03-11 DIAGNOSIS — K838 Other specified diseases of biliary tract: Secondary | ICD-10-CM | POA: Diagnosis not present

## 2021-03-11 DIAGNOSIS — K8020 Calculus of gallbladder without cholecystitis without obstruction: Secondary | ICD-10-CM | POA: Diagnosis not present

## 2021-03-17 NOTE — Telephone Encounter (Signed)
Patient is scheduled for 03/18/2020

## 2021-03-18 ENCOUNTER — Telehealth (INDEPENDENT_AMBULATORY_CARE_PROVIDER_SITE_OTHER): Payer: BC Managed Care – PPO | Admitting: Nurse Practitioner

## 2021-03-18 ENCOUNTER — Encounter: Payer: Self-pay | Admitting: Nurse Practitioner

## 2021-03-18 ENCOUNTER — Other Ambulatory Visit: Payer: Self-pay

## 2021-03-18 VITALS — Ht 68.5 in | Wt 255.0 lb

## 2021-03-18 DIAGNOSIS — K8020 Calculus of gallbladder without cholecystitis without obstruction: Secondary | ICD-10-CM | POA: Insufficient documentation

## 2021-03-18 DIAGNOSIS — K760 Fatty (change of) liver, not elsewhere classified: Secondary | ICD-10-CM

## 2021-03-18 DIAGNOSIS — R7989 Other specified abnormal findings of blood chemistry: Secondary | ICD-10-CM

## 2021-03-18 NOTE — Progress Notes (Signed)
Virtual Visit via Telephone Note  I connected with Miranda Delacruz on 03/20/21 at  2:30 PM EST by telephone and verified that I am speaking with the correct person using two identifiers.  Location: Patient: home  Provider: [facility]     I discussed the limitations, risks, security and privacy concerns of performing an evaluation and management service by telephone and the availability of in person appointments. I also discussed with the patient that there may be a patient responsible charge related to this service. The patient expressed understanding and agreed to proceed.   History of Present Illness: The patient has had RUQ ultrasound since last visit with me. Several new findings were evident, including:  1. Hepatic steatosis with hepatomegaly sonographic findings suggestive of cirrhosis. No focal liver lesion identified. 2. Cholelithiasis without sonographic evidence of acute cholecystitis. 3. Stable mild dilatation of the common bile duct measuring 9 mm, given stability the finding is likely within normal limits for this patient, consider further evaluation with laboratory values to exclude biliary obstruction. 4. Splenomegaly which in the setting of cirrhosis would be indicative of portal hypertension.  Recent labs indicate that liver functions were elevated. This has been chronic over past few years. This check, they are higher than they had been. Ultrasound done In 2016. This ultrasound did show the following:  1. Gallstones without sonographic evidence of acute cholecystitis. 2. Mild dilation of the common bile duct without definite evidence of intraluminal stones. Limited visualization of the pancreatic head revealed no mass. 3. Increased hepatic echotexture consistent with fatty infiltrative change.   Observations/Objective:  The patient is alert and oriented. She is pleasant and answers all questions appropriately. Breathing is non-labored.  She is in no acute distress at this time.    Today's Vitals   03/18/21 1328  Weight: 255 lb (115.7 kg)  Height: 5' 8.5" (1.74 m)   Body mass index is 38.21 kg/m.   Assessment and Plan: Problem List Items Addressed This Visit       Digestive   Hepatic steatosis    Based on ultrasound results, patient referred to GI for further evaluation.       Relevant Orders   Ambulatory referral to Gastroenterology   Calculus of gallbladder without cholecystitis without obstruction - Primary    Referral to general surgery made due to gallstones seen on RUQ ultrasound.       Relevant Orders   Ambulatory referral to General Surgery     Other   Elevated liver function tests    Liver enzymes have been chronically elevated, though more so on recent CMP. Referral made to GI for further evaluation.       Relevant Orders   Ambulatory referral to Gastroenterology     Follow Up Instructions:    I discussed the assessment and treatment plan with the patient. The patient was provided an opportunity to ask questions and all were answered. The patient agreed with the plan and demonstrated an understanding of the instructions.   The patient was advised to call back or seek an in-person evaluation if the symptoms worsen or if the condition fails to improve as anticipated.  I provided 20 minutes of non-face-to-face time during this encounter.   Ronnell Freshwater, NP

## 2021-03-20 ENCOUNTER — Encounter: Payer: Self-pay | Admitting: Nurse Practitioner

## 2021-03-20 NOTE — Assessment & Plan Note (Signed)
Referral to general surgery made due to gallstones seen on RUQ ultrasound.

## 2021-03-20 NOTE — Assessment & Plan Note (Signed)
Liver enzymes have been chronically elevated, though more so on recent CMP. Referral made to GI for further evaluation.

## 2021-03-20 NOTE — Assessment & Plan Note (Signed)
Based on ultrasound results, patient referred to GI for further evaluation.

## 2021-03-20 NOTE — Assessment & Plan Note (Signed)
>>  ASSESSMENT AND PLAN FOR ELEVATED LIVER FUNCTION TESTS WRITTEN ON 03/20/2021 12:21 PM BY BOSCIA, HEATHER E, NP  Liver enzymes have been chronically elevated, though more so on recent CMP. Referral made to GI for further evaluation.

## 2021-03-20 NOTE — Progress Notes (Signed)
Reviewed with patient during phone visit. Referred to general surgery and GI for further evaluation.

## 2021-03-31 ENCOUNTER — Ambulatory Visit: Payer: BC Managed Care – PPO | Admitting: Physician Assistant

## 2021-04-01 DIAGNOSIS — K8020 Calculus of gallbladder without cholecystitis without obstruction: Secondary | ICD-10-CM | POA: Diagnosis not present

## 2021-04-01 DIAGNOSIS — K760 Fatty (change of) liver, not elsewhere classified: Secondary | ICD-10-CM | POA: Diagnosis not present

## 2021-04-08 ENCOUNTER — Ambulatory Visit: Payer: BC Managed Care – PPO | Admitting: Nurse Practitioner

## 2021-04-08 ENCOUNTER — Encounter: Payer: Self-pay | Admitting: Nurse Practitioner

## 2021-04-08 ENCOUNTER — Other Ambulatory Visit (INDEPENDENT_AMBULATORY_CARE_PROVIDER_SITE_OTHER): Payer: BC Managed Care – PPO

## 2021-04-08 VITALS — BP 120/66 | HR 76 | Ht 67.0 in | Wt 257.1 lb

## 2021-04-08 DIAGNOSIS — R7989 Other specified abnormal findings of blood chemistry: Secondary | ICD-10-CM

## 2021-04-08 DIAGNOSIS — R935 Abnormal findings on diagnostic imaging of other abdominal regions, including retroperitoneum: Secondary | ICD-10-CM | POA: Diagnosis not present

## 2021-04-08 DIAGNOSIS — K769 Liver disease, unspecified: Secondary | ICD-10-CM | POA: Diagnosis not present

## 2021-04-08 LAB — PROTIME-INR
INR: 1.1 ratio — ABNORMAL HIGH (ref 0.8–1.0)
Prothrombin Time: 12.3 s (ref 9.6–13.1)

## 2021-04-08 NOTE — Progress Notes (Signed)
Addendum: Reviewed and agree with assessment and management plan. Riese Hellard M, MD  

## 2021-04-08 NOTE — Patient Instructions (Addendum)
PROCEDURES: You have been scheduled for a EGD. Please follow the written instructions given to you at your visit today. If you use inhalers (even only as needed), please bring them with you on the day of your procedure.  LABS:   Please proceed to the basement level for lab work before leaving today. Press "B" on the elevator. The lab is located at the first door on the left as you exit the elevator.  HEALTHCARE LAWS AND MY CHART RESULTS:   Due to recent changes in healthcare laws, you may see results of your imaging and/or laboratory studies on MyChart before I have had a chance to review them.  I understand that in some cases there may be results that are confusing or concerning to you. Please understand that not all results are received at the same time and often I may need to interpret multiple results in order to provide you with the best plan of care or course of treatment. Therefore, I ask that you please give me 48 hours to thoroughly review all your results before contacting my office for clarification.   Follow a low carb diet.    BMI:  If you are age 15 or older, your body mass index should be between 23-30. Your Body mass index is 40.27 kg/m. If this is out of the aforementioned range listed, please consider follow up with your Primary Care Provider.  If you are age 37 or younger, your body mass index should be between 19-25. Your Body mass index is 40.27 kg/m. If this is out of the aformentioned range listed, please consider follow up with your Primary Care Provider.   MY CHART:  The [facility] providers would like to encourage you to use [facility] to communicate with providers for non-urgent requests or questions.  Due to long hold times on the telephone, sending your provider a message by [facility] may be a faster and more efficient way to get a response.  Please allow 48 business hours for a response.  Please remember that this is for non-urgent requests.   Thank you for  trusting me with your gastrointestinal care!    Tye Savoy, NP

## 2021-04-08 NOTE — Progress Notes (Signed)
ASSESSMENT AND PLAN    # 61 yo female with chronic liver disease. Probably has steatohepatitis  related to obesity and chronic Etoh use. [place] raises concern for cirrhosis. Splenomegaly possibly representing portal hypertension  --Abstinent from Etoh since mid Jan when she got [place] result.  --Obtain complete hepatic serologic workup to look for concurrent causes of liver disease --HAV, HBV vaccinations if indicated after blood work reviewed.  --INR --HCC screening. No focal lesions on [place] Jan 2023 . Obtain AFP.  --Varices screening- Schedule for EGD. The  risks and benefits of EGD with possible biopsies were discussed with the patient who agrees to proceed.  --For any pain may take Tylenol, up to 2,000 mg / daily. Currently taking Lodine. We discussed that anti-inflammatories are not recommended for her in setting of possible cirrhosis.  --Recommend she work on weight loss with low carbohydrate diet  --Follow up after EGD for ongoing management of chronic liver disease   # History of adenomatous colon polyps.  Last colonoscopy April 2022.  A 3-year interval colonoscopy was recommended  # Chronic biliary duct dilation on imaging. Stable.   # Asymptomatic cholelithiasis  HISTORY OF PRESENT ILLNESS     Chief Complaint :  abnormal [place]  Miranda Delacruz is a 61 y.o. female with a past medical history significant for colon polyps, thyroid cancer s/p throidectomy on placement therapy, DM2, obesity, cholelithiasis , HTN. See PMH below for any additional history.   Patient known to Dr. Hilarie Fredrickson for history of colon polyps. She is referred by [name], NP for steatosis and elevated liver enzymes.   [name]'s liver enzymes have been elevated for years. They have ranged anywhere from 3 to 6 x ULN.  Abd [place] last month >> Hepatic steatosis with hepatomegaly sonographic findings suggestive of cirrhosis. No focal liver lesion identified. Cholelithiasis and splenomegaly.    [name] has no known Gilbert of  liver disease. Her history of Etoh use is described as drinking 4-5 times a week for many years. Each time she drinks 4-5 mixed drinks with about 2 oz of Vodka in each.    Data Reviewed:  CBC Latest Ref Rng & Units 09/05/2020 12/17/2017 10/15/2015  WBC 3.4 - 10.8 x10E3/uL 5.9 4.1 4.0  Hemoglobin 11.1 - 15.9 g/dL 14.5 14.9 15.1  Hematocrit 34.0 - 46.6 % 43.4 42.9 44.0  Platelets 150 - 450 x10E3/uL 241 201 218    No results found for: LIPASE CMP Latest Ref Rng & Units 02/21/2021 09/05/2020 03/05/2020  Glucose 70 - 99 mg/dL 179(H) 191(H) 143(H)  BUN 6 - 24 mg/dL 11 11 13   Creatinine 0.57 - 1.00 mg/dL 0.66 0.79 0.84  Sodium 134 - 144 mmol/L 137 140 138  Potassium 3.5 - 5.2 mmol/L 4.0 4.4 4.3  Chloride 96 - 106 mmol/L 99 97 99  CO2 20 - 29 mmol/L 21 16(L) 25  Calcium 8.7 - 10.2 mg/dL 9.0 10.6(H) 9.6  Total Protein 6.0 - 8.5 g/dL 6.3 7.2 6.8  Total Bilirubin 0.0 - 1.2 mg/dL 1.1 0.9 1.0  Alkaline Phos 44 - 121 IU/L 61 64 54  AST 0 - 40 IU/L 71(H) 67(H) 62(H)  ALT 0 - 32 IU/L 116(H) 135(H) 116(H)      US Abdomen Complete CLINICAL DATA:  Hepatic steatosis with persistently elevated liver enzymes.  EXAM: ABDOMEN ULTRASOUND COMPLETE  COMPARISON:  12/18/2014  FINDINGS: Gallbladder: Cholelithiasis without evidence of acute cholecystitis. No sonographic Murphy sign noted by sonographer.  Common bile duct: Diameter: 9 mm, similar  to ultrasound December 18, 2014  Liver: No focal lesion identified. Hepatomegaly. Coarsened hepatic echotexture with diffusely increased parenchymal echogenicity and areas of subtle contour nodularity. Portal vein is patent on color Doppler imaging with normal direction of blood flow towards the liver.  IVC: No abnormality visualized.  Pancreas: Visualized portion unremarkable.  Spleen: Splenomegaly measuring up to 17.7 cm with a volume of 1325 cc.  Right Kidney: Length: 13.6 cm. Echogenicity within normal limits. No mass or hydronephrosis  visualized.  Left Kidney: Length: 15.7 cm. Echogenicity within normal limits. No mass or hydronephrosis visualized.  Abdominal aorta: No aneurysm visualized.  Other findings: None.  IMPRESSION: 1. Hepatic steatosis with hepatomegaly sonographic findings suggestive of cirrhosis. No focal liver lesion identified. 2. Cholelithiasis without sonographic evidence of acute cholecystitis. 3. Stable mild dilatation of the common bile duct measuring 9 mm, given stability the finding is likely within normal limits for this patient, consider further evaluation with laboratory values to exclude biliary obstruction. 4. Splenomegaly which in the setting of cirrhosis would be indicative of portal hypertension.  Electronically Signed   By: Dahlia Bailiff M.D.   On: 03/11/2021 18:47   PREVIOUS GI EVALUATIONS:   March 2022 polyp surveillance colonoscopy -One 4 mm polyp in the cecum, removed with a cold snare. Resected and retrieved. - One 5 mm polyp in the ascending colon, removed with a cold snare. Resected and retrieved. - One 9 mm polyp in the ascending colon, removed with a cold snare. Resected and retrieved. - Two 4 to 5 mm polyps in the sigmoid colon, removed with a cold snare. Resected and retrieved. - Diverticulosis in the sigmoid colon, in the descending colon and in the transverse colon. - The distal rectum and anal verge are normal on retroflexion view.  Surgical [P], colon, ascending x 2, and cecum x 2, polyp (3) - TUBULAR ADENOMA (X2 FRAGMENTS). - SESSILE SERRATED POLYP WITHOUT DYSPLASIA (X2 FRAGMENTS), SEE COMMENT. - NO HIGH GRADE DYSPLASIA OR MALIGNANCY. 2. Surgical [P], colon, sigmoid x 2, polyp (2) - PERINEUROMA. - HYPERPLASTIC POLYP. - NO DYSPLASIA OR MALIGNANCY    Past Medical History:  Diagnosis Date   Colon polyps    Diabetes mellitus without complication ([place])    Gallstones    GERD (gastroesophageal reflux disease)    Hyperlipidemia    Hypertension     Hypothyroidism    Thyroid cancer ([place])    thyroid     Past Surgical History:  Procedure Laterality Date   COLONOSCOPY  2000   normal   COLONOSCOPY     COLONOSCOPY WITH PROPOFOL N/A 01/22/2015   Procedure: COLONOSCOPY WITH PROPOFOL;  Surgeon: Lucilla Lame, MD;  Location: [facility] ENDOSCOPY;  Service: Endoscopy;  Laterality: N/A;   POLYPECTOMY     THYROIDECTOMY  2009   TONSILLECTOMY AND ADENOIDECTOMY  1976   TUBAL LIGATION  1997   Family History  Problem Relation Age of Onset   Hypertension Mother    Hypertension Maternal Grandmother    Stroke Maternal Grandmother    Colon polyps Neg Hx    Colon cancer Neg Hx    Esophageal cancer Neg Hx    Rectal cancer Neg Hx    Stomach cancer Neg Hx    Social History   Tobacco Use   Smoking status: Former    Types: Cigarettes    Quit date: 02/24/1987    Years since quitting: 34.1   Smokeless tobacco: Never  Vaping Use   Vaping Use: Never used  Substance Use Topics  Alcohol use: Yes    Comment: 5 per day   Drug use: No   Current Outpatient Medications  Medication Sig Dispense Refill   amLODipine (NORVASC) 5 MG tablet Take 1 tablet by mouth once daily 90 tablet 1   Cholecalciferol (VITAMIN D3) 125 MCG (5000 UT) CAPS      Coenzyme Q10 200 MG capsule Take 1 capsule by mouth daily.     diclofenac Sodium (VOLTAREN) 1 % GEL Apply 4 g topically 4 (four) times daily. 100 g 2   etodolac (LODINE) 400 MG tablet Take 1 tablet by mouth twice daily 60 tablet 0   hydrochlorothiazide (MICROZIDE) 12.5 MG capsule Take 1 capsule by mouth once daily 90 capsule 1   levothyroxine (EUTHYROX) 150 MCG tablet Take 1 tablet (150 mcg total) by mouth daily before breakfast. 30 tablet 3   magnesium oxide (MAG-OX) 400 MG tablet Take 2 tablets by mouth daily.     Menaquinone-7 (VITAMIN K2) 100 MCG CAPS      metFORMIN (GLUCOPHAGE) 500 MG tablet TAKE 1 TABLET BY MOUTH TWICE DAILY WITH MEALS 180 tablet 0   metoprolol succinate (TOPROL-XL) 100 MG 24 hr tablet Take 1  tablet by mouth twice daily 180 tablet 1   milk thistle 175 MG tablet Take 175 mg by mouth daily.     sitaGLIPtin (JANUVIA) 25 MG tablet Take 1 tablet (25 mg total) by mouth daily. 30 tablet 2   valsartan (DIOVAN) 160 MG tablet Take 1 tablet by mouth once daily 90 tablet 1   vitamin B-12 (CYANOCOBALAMIN) 1000 MCG tablet Take 1,000 mcg by mouth daily.     zinc gluconate 50 MG tablet Take 1 tablet by mouth daily.     No current facility-administered medications for this visit.   No Known Allergies   Review of Systems: No abdominal pain.  No urinary symptoms.  No shortness of breath . Marland Kitchen    PHYSICAL EXAM :    Wt Readings from Last 3 Encounters:  04/08/21 257 lb 2 oz (116.6 kg)  03/18/21 255 lb (115.7 kg)  02/13/21 256 lb (116.1 kg)    Ht 5' 7"  (1.702 m) Comment: height measured without shoes   Wt 257 lb 2 oz (116.6 kg)    BMI 40.27 kg/m  Constitutional:  Generally well appearing female in no acute distress. Psychiatric: Pleasant. Normal mood and affect. Behavior is normal. EENT: Pupils normal.  Conjunctivae are normal. No scleral icterus. Neck supple.  Cardiovascular: Normal rate, regular rhythm. No edema Pulmonary/chest: Effort normal and breath sounds normal. No wheezing, rales or rhonchi. Abdominal: Soft, nondistended, nontender. Bowel sounds active throughout. There are no masses palpable. No hepatomegaly. Neurological: Alert and oriented to person place and time. Skin: Skin is warm and dry. No rashes noted.  Tye Savoy, NP  04/08/2021, 11:34 AM  Cc:  Referring Provider Ronnell Freshwater, NP

## 2021-04-11 LAB — HEPATITIS C ANTIBODY
Hepatitis C Ab: NONREACTIVE
SIGNAL TO CUT-OFF: <0.02 (ref <1.00)

## 2021-04-11 LAB — ANTI-NUCLEAR AB-TITER (ANA TITER)
ANA TITER: 1:40 {titer} — ABNORMAL HIGH
ANA Titer 1: 1:40 {titer} — ABNORMAL HIGH

## 2021-04-11 LAB — HEPATITIS B SURFACE ANTIBODY,QUALITATIVE: Hep B S Ab: NONREACTIVE

## 2021-04-11 LAB — AFP TUMOR MARKER: AFP-Tumor Marker: 3.2 ng/mL

## 2021-04-11 LAB — IGA: Immunoglobulin A: 39 mg/dL — ABNORMAL LOW (ref 47–310)

## 2021-04-11 LAB — MITOCHONDRIAL ANTIBODIES: Mitochondrial M2 Ab, IgG: <=20 U (ref <=20.0)

## 2021-04-11 LAB — ANTI-SMOOTH MUSCLE ANTIBODY, IGG: Actin (Smooth Muscle) Antibody (IGG): <20 U (ref <20)

## 2021-04-11 LAB — HEPATITIS A ANTIBODY, TOTAL: Hepatitis A AB,Total: NONREACTIVE

## 2021-04-11 LAB — CERULOPLASMIN: Ceruloplasmin: 25 mg/dL (ref 18–53)

## 2021-04-11 LAB — HEPATITIS B SURFACE ANTIGEN: Hepatitis B Surface Ag: NONREACTIVE

## 2021-04-11 LAB — HEPATITIS B CORE ANTIBODY, TOTAL: Hep B Core Total Ab: NONREACTIVE

## 2021-04-11 LAB — ANA: Anti Nuclear Antibody (ANA): POSITIVE — AB

## 2021-04-11 LAB — TISSUE TRANSGLUTAMINASE ABS,IGG,IGA
(tTG) Ab, IgA: <1 U/mL
(tTG) Ab, IgG: <1 U/mL

## 2021-04-11 LAB — ALPHA-1-ANTITRYPSIN: A-1 Antitrypsin, Ser: 139 mg/dL (ref 83–199)

## 2021-04-17 ENCOUNTER — Other Ambulatory Visit: Payer: Self-pay | Admitting: Nurse Practitioner

## 2021-04-17 DIAGNOSIS — K769 Liver disease, unspecified: Secondary | ICD-10-CM

## 2021-05-11 ENCOUNTER — Telehealth: Payer: BC Managed Care – PPO | Admitting: Family

## 2021-05-11 DIAGNOSIS — J209 Acute bronchitis, unspecified: Secondary | ICD-10-CM

## 2021-05-11 MED ORDER — PREDNISONE 10 MG (21) PO TBPK: ORAL_TABLET | ORAL | 0 refills | Status: DC

## 2021-05-11 MED ORDER — BENZONATATE 200 MG PO CAPS: 200.0000 mg | ORAL_CAPSULE | Freq: Two times a day (BID) | ORAL | 0 refills | Status: DC | PRN

## 2021-05-11 NOTE — Patient Instructions (Signed)
Acute Bronchitis, Adult °Acute bronchitis is sudden inflammation of the main airways (bronchi) that come off the windpipe (trachea) in the lungs. The swelling causes the airways to get smaller and make more mucus than normal. This can make it hard to breathe and can cause coughing or noisy breathing (wheezing). °Acute bronchitis may last several weeks. The cough may last longer. Allergies, asthma, and exposure to smoke may make the condition worse. °What are the causes? °This condition can be caused by germs and by substances that irritate the lungs, including: °Cold and flu viruses. The most common cause of this condition is the virus that causes the common cold. °Bacteria. This is less common. °Breathing in substances that irritate the lungs, including: °Smoke from cigarettes and other forms of tobacco. °Dust and pollen. °Fumes from household cleaning products, gases, or burned fuel. °Indoor or outdoor air pollution. °What increases the risk? °The following factors may make you more likely to develop this condition: °A weak body's defense system, also called the immune system. °A condition that affects your lungs and breathing, such as asthma. °What are the signs or symptoms? °Common symptoms of this condition include: °Coughing. This may bring up clear, yellow, or green mucus from your lungs (sputum). °Wheezing. °Runny or stuffy nose. °Having too much mucus in your lungs (chest congestion). °Shortness of breath. °Aches and pains, including sore throat or chest. °How is this diagnosed? °This condition is usually diagnosed based on: °Your symptoms and medical history. °A physical exam. °You may also have other tests, including tests to rule out other conditions, such as pneumonia. These tests include: °A test of lung function. °Test of a mucus sample to look for the presence of bacteria. °Tests to check the oxygen level in your blood. °Blood tests. °Chest X-ray. °How is this treated? °Most cases of acute bronchitis  clear up over time without treatment. Your health care provider may recommend: °Drinking more fluids to help thin your mucus so it is easier to cough up. °Taking inhaled medicine (inhaler) to improve air flow in and out of your lungs. °Using a vaporizer or a humidifier. These are machines that add water to the air to help you breathe better. °Taking a medicine that thins mucus and clears congestion (expectorant). °Taking a medicine that prevents or stops coughing (cough suppressant). °It is notcommon to take an antibiotic medicine for this condition. °Follow these instructions at home: ° °Take over-the-counter and prescription medicines only as told by your health care provider. °Use an inhaler, vaporizer, or humidifier as told by your health care provider. °Take two teaspoons (10 mL) of honey at bedtime to lessen coughing at night. °Drink enough fluid to keep your urine pale yellow. °Do not use any products that contain nicotine or tobacco. These products include cigarettes, chewing tobacco, and vaping devices, such as e-cigarettes. If you need help quitting, ask your health care provider. °Get plenty of rest. °Return to your normal activities as told by your health care provider. Ask your health care provider what activities are safe for you. °Keep all follow-up visits. This is important. °How is this prevented? °To lower your risk of getting this condition again: °Wash your hands often with soap and water for at least 20 seconds. If soap and water are not available, use hand sanitizer. °Avoid contact with people who have cold symptoms. °Try not to touch your mouth, nose, or eyes with your hands. °Avoid breathing in smoke or chemical fumes. Breathing smoke or chemical fumes will make your condition   worse. °Get the flu shot every year. °Contact a health care provider if: °Your symptoms do not improve after 2 weeks. °You have trouble coughing up the mucus. °Your cough keeps you awake at night. °You have a  fever. °Get help right away if you: °Cough up blood. °Feel pain in your chest. °Have severe shortness of breath. °Faint or keep feeling like you are going to faint. °Have a severe headache. °Have a fever or chills that get worse. °These symptoms may represent a serious problem that is an emergency. Do not wait to see if the symptoms will go away. Get medical help right away. Call your local emergency services (911 in the [place] Do not drive yourself to the hospital. °Summary °Acute bronchitis is inflammation of the main airways (bronchi) that come off the windpipe (trachea) in the lungs. The swelling causes the airways to get smaller and make more mucus than normal. °Drinking more fluids can help thin your mucus so it is easier to cough up. °Take over-the-counter and prescription medicines only as told by your health care provider. °Do not use any products that contain nicotine or tobacco. These products include cigarettes, chewing tobacco, and vaping devices, such as e-cigarettes. If you need help quitting, ask your health care provider. °Contact a health care provider if your symptoms do not improve after 2 weeks. °This information is not intended to replace advice given to you by your health care provider. Make sure you discuss any questions you have with your health care provider. °Document Revised: 06/12/2020 Document Reviewed: 06/12/2020 °Elsevier Patient Education © 2022 [employer] ° °

## 2021-05-11 NOTE — Progress Notes (Signed)
?Virtual Visit Consent  ? ?Miranda Delacruz, you are scheduled for a virtual visit with a [employer] provider today.   ?  ?Just as with appointments in the office, your consent must be obtained to participate.  Your consent will be active for this visit and any virtual visit you may have with one of our providers in the next 365 days.   ?  ?If you have a MyChart account, a copy of this consent can be sent to you electronically.  All virtual visits are billed to your insurance company just like a traditional visit in the office.   ? ?As this is a virtual visit, video technology does not allow for your provider to perform a traditional examination.  This may limit your provider's ability to fully assess your condition.  If your provider identifies any concerns that need to be evaluated in person or the need to arrange testing (such as labs, EKG, etc.), we will make arrangements to do so.   ?  ?Although advances in technology are sophisticated, we cannot ensure that it will always work on either your end or our end.  If the connection with a video visit is poor, the visit may have to be switched to a telephone visit.  With either a video or telephone visit, we are not always able to ensure that we have a secure connection.    ? ?I need to obtain your verbal consent now.   Are you willing to proceed with your visit today?  ?  ?Miranda Delacruz has provided verbal consent on 05/11/2021 for a virtual visit (video or telephone). ?  ?Miranda Dun, FNP  ? ?Date: 05/11/2021 8:39 AM ? ? ?Virtual Visit via Video Note  ? Miranda Delacruz, connected with  Miranda Delacruz  ([id number], 04-25-1960) on 05/11/21 at  8:30 AM EDT by a video-enabled telemedicine application and verified that I am speaking with the correct person using two identifiers. ? ?Location: ?Patient: Virtual Visit Location Patient: Home ?Provider: Virtual Visit Location Provider: Home Office ?  ?I discussed the limitations of evaluation and management by telemedicine  and the availability of in person appointments. The patient expressed understanding and agreed to proceed.   ? ?History of Present Illness: ?Miranda Delacruz is a 61 y.o. who identifies as a female who was assigned female at birth, and is being seen today for sinus issues and cough. ? ?HPI: Sinus Problem ?This is a new problem. The current episode started in the past 7 days. The problem has been gradually worsening since onset. There has been no fever. Her pain is at a severity of 6/10. The pain is mild. Associated symptoms include congestion, coughing, ear pain, headaches, a hoarse voice, sinus pressure, sneezing and a sore throat. Pertinent negatives include no chills. Past treatments include acetaminophen and oral decongestants. The treatment provided mild relief.  ?Cough ?This is a new problem. The problem has been gradually worsening. The problem occurs every few minutes. The cough is Non-productive. Associated symptoms include ear pain, headaches and a sore throat. Pertinent negatives include no chills.   ?Problems:  ?Patient Active Problem List  ? Diagnosis Date Noted  ? Calculus of gallbladder without cholecystitis without obstruction 03/18/2021  ? Elevated liver function tests 03/18/2021  ? Acute hip pain, left 01/29/2021  ? Muscle pain 01/29/2021  ? Acute pain of right knee 05/17/2020  ? Consumes a vegan diet- currently, for now 09/29/2018  ? Statin declined 05/26/2018  ? Type 2 diabetes mellitus with hypertriglyceridemia (HCC)  04/22/2018  ? Type 2 diabetes mellitus without complication, without long-term current use of insulin ([place]) 12/16/2017  ? Hypertension associated with diabetes ([place]) 12/16/2017  ? Mixed diabetic hyperlipidemia associated with type 2 diabetes mellitus ([place]) 12/16/2017  ? Microalbuminuria due to type 2 diabetes mellitus ([place]) 12/16/2017  ? Dysuria 02/08/2017  ? Abnormal urinalysis 02/08/2017  ? Counseling on health promotion and disease prevention 07/31/2016  ? Cervical cancer screening  07/31/2016  ? Screening for breast cancer 07/31/2016  ? Low serum HDL 10/24/2015  ? Menopause present 09/25/2015  ? Migraine headache with aura 02/11/2015  ? Special screening for malignant neoplasms, colon   ? Benign neoplasm of ascending colon   ? Benign neoplasm of transverse colon   ? Benign neoplasm of sigmoid colon   ? Hepatic steatosis 12/18/2014  ? Elevated liver enzymes- from fatty liver disease. 12/10/2014  ? Hyperlipidemia 12/10/2014  ? Vitamin D deficiency 12/10/2014  ? Obesity, Class III, BMI 40-49.9 (morbid obesity) ([place]) 11/21/2014  ? Hypothyroidism 11/21/2014  ? HTN (hypertension) 11/21/2014  ? Hx of papillary thyroid cancer 11/21/2014  ?  ?Allergies: No Known Allergies ?Medications:  ?Current Outpatient Medications:  ?  benzonatate (TESSALON) 200 MG capsule, Take 1 capsule (200 mg total) by mouth 2 (two) times daily as needed for cough., Disp: 20 capsule, Rfl: 0 ?  predniSONE (STERAPRED UNI-PAK 21 TAB) 10 MG (21) TBPK tablet, Use as directed, Disp: 21 tablet, Rfl: 0 ?  amLODipine (NORVASC) 5 MG tablet, Take 1 tablet by mouth once daily, Disp: 90 tablet, Rfl: 1 ?  Cholecalciferol (VITAMIN D3) 125 MCG (5000 UT) CAPS, , Disp: , Rfl:  ?  Coenzyme Q10 200 MG capsule, Take 1 capsule by mouth daily., Disp: , Rfl:  ?  diclofenac Sodium (VOLTAREN) 1 % GEL, Apply 4 g topically 4 (four) times daily., Disp: 100 g, Rfl: 2 ?  etodolac (LODINE) 400 MG tablet, Take 1 tablet by mouth twice daily, Disp: 60 tablet, Rfl: 0 ?  hydrochlorothiazide (MICROZIDE) 12.5 MG capsule, Take 1 capsule by mouth once daily, Disp: 90 capsule, Rfl: 1 ?  levothyroxine (EUTHYROX) 150 MCG tablet, Take 1 tablet (150 mcg total) by mouth daily before breakfast., Disp: 30 tablet, Rfl: 3 ?  magnesium oxide (MAG-OX) 400 MG tablet, Take 2 tablets by mouth daily., Disp: , Rfl:  ?  Menaquinone-7 (VITAMIN K2) 100 MCG CAPS, , Disp: , Rfl:  ?  metFORMIN (GLUCOPHAGE) 500 MG tablet, TAKE 1 TABLET BY MOUTH TWICE DAILY WITH MEALS, Disp: 180 tablet, Rfl:  0 ?  metoprolol succinate (TOPROL-XL) 100 MG 24 hr tablet, Take 1 tablet by mouth twice daily, Disp: 180 tablet, Rfl: 1 ?  milk thistle 175 MG tablet, Take 175 mg by mouth daily., Disp: , Rfl:  ?  sitaGLIPtin (JANUVIA) 25 MG tablet, Take 1 tablet (25 mg total) by mouth daily., Disp: 30 tablet, Rfl: 2 ?  valsartan (DIOVAN) 160 MG tablet, Take 1 tablet by mouth once daily, Disp: 90 tablet, Rfl: 1 ?  vitamin B-12 (CYANOCOBALAMIN) 1000 MCG tablet, Take 1,000 mcg by mouth daily., Disp: , Rfl:  ?  zinc gluconate 50 MG tablet, Take 1 tablet by mouth daily., Disp: , Rfl:  ? ?Observations/Objective: ?Patient is well-developed, well-nourished in no acute distress.  ?Resting comfortably  at home.  ?Head is normocephalic, atraumatic.  ?No labored breathing.  ?Speech is clear and coherent with logical content.  ?Patient is alert and oriented at baseline.  ? ? ?Assessment and Plan: ?1. Acute bronchitis, unspecified organism ?-  predniSONE (STERAPRED UNI-PAK 21 TAB) 10 MG (21) TBPK tablet; Use as directed  Dispense: 21 tablet; Refill: 0 ?- benzonatate (TESSALON) 200 MG capsule; Take 1 capsule (200 mg total) by mouth 2 (two) times daily as needed for cough.  Dispense: 20 capsule; Refill: 0 ? ?- Take meds as prescribed ?- Use a cool mist humidifier  ?-Use saline nose sprays frequently ?-Force fluids ?-For any cough or congestion ? Use plain Mucinex- regular strength or max strength is fine ?-For fever or aces or pains- take tylenol or ibuprofen. ?-Throat lozenges if help ?-Low carb diet while on prednisone  ? ? ?Follow Up Instructions: ?I discussed the assessment and treatment plan with the patient. The patient was provided an opportunity to ask questions and all were answered. The patient agreed with the plan and demonstrated an understanding of the instructions.  A copy of instructions were sent to the patient via MyChart unless otherwise noted below.  ? ? ? ?The patient was advised to call back or seek an in-person evaluation if  the symptoms worsen or if the condition fails to improve as anticipated. ? ?Time:  ?I spent 10 minutes with the patient via telehealth technology discussing the above problems/concerns.   ? ?Marcha Dutton

## 2021-05-12 ENCOUNTER — Encounter: Payer: Self-pay | Admitting: Nurse Practitioner

## 2021-05-12 ENCOUNTER — Other Ambulatory Visit: Payer: Self-pay

## 2021-05-12 ENCOUNTER — Encounter: Payer: Self-pay | Admitting: Physician Assistant

## 2021-05-12 ENCOUNTER — Ambulatory Visit (INDEPENDENT_AMBULATORY_CARE_PROVIDER_SITE_OTHER): Payer: BC Managed Care – PPO | Admitting: Physician Assistant

## 2021-05-12 VITALS — BP 115/70 | HR 66 | Temp 98.7°F | Ht 67.0 in | Wt 258.0 lb

## 2021-05-12 DIAGNOSIS — J0140 Acute pansinusitis, unspecified: Secondary | ICD-10-CM

## 2021-05-12 MED ORDER — AZITHROMYCIN 250 MG PO TABS: ORAL_TABLET | ORAL | 0 refills | Status: AC

## 2021-05-12 NOTE — Patient Instructions (Signed)

## 2021-05-12 NOTE — Progress Notes (Signed)
?Established patient acute visit ? ? ?Patient: Miranda Delacruz   DOB: 1960-05-28   61 y.o. Female  MRN: [id number] ?Visit Date: 05/12/2021 ? ?Chief Complaint  ?Patient presents with  ? Acute Visit  ? Sinusitis  ? ?Subjective  ?  ?Sinusitis ?  ?Patient presents for c/o cough, sore throat, postnasal drainage, headache, bilateral ear pain, and sinus pressure. Denies fever, nausea, vomiting, body aches, chills, or night sweats. Had a runny nose yesterday. Symptoms started last Monday and have gradually worsened. Patient had a virtual visit yesterday and was given prednisone and tessalon Perles for bronchitis.  ? ? ? ? ? ?Medications: ?Outpatient Medications Prior to Visit  ?Medication Sig  ? amLODipine (NORVASC) 5 MG tablet Take 1 tablet by mouth once daily  ? benzonatate (TESSALON) 200 MG capsule Take 1 capsule (200 mg total) by mouth 2 (two) times daily as needed for cough.  ? Cholecalciferol (VITAMIN D3) 125 MCG (5000 UT) CAPS   ? Coenzyme Q10 200 MG capsule Take 1 capsule by mouth daily.  ? diclofenac Sodium (VOLTAREN) 1 % GEL Apply 4 g topically 4 (four) times daily.  ? etodolac (LODINE) 400 MG tablet Take 1 tablet by mouth twice daily  ? hydrochlorothiazide (MICROZIDE) 12.5 MG capsule Take 1 capsule by mouth once daily  ? levothyroxine (EUTHYROX) 150 MCG tablet Take 1 tablet (150 mcg total) by mouth daily before breakfast.  ? magnesium oxide (MAG-OX) 400 MG tablet Take 2 tablets by mouth daily.  ? Menaquinone-7 (VITAMIN K2) 100 MCG CAPS   ? metFORMIN (GLUCOPHAGE) 500 MG tablet TAKE 1 TABLET BY MOUTH TWICE DAILY WITH MEALS  ? metoprolol succinate (TOPROL-XL) 100 MG 24 hr tablet Take 1 tablet by mouth twice daily  ? milk thistle 175 MG tablet Take 175 mg by mouth daily.  ? predniSONE (STERAPRED UNI-PAK 21 TAB) 10 MG (21) TBPK tablet Use as directed  ? sitaGLIPtin (JANUVIA) 25 MG tablet Take 1 tablet (25 mg total) by mouth daily.  ? valsartan (DIOVAN) 160 MG tablet Take 1 tablet by mouth once daily  ? vitamin B-12  (CYANOCOBALAMIN) 1000 MCG tablet Take 1,000 mcg by mouth daily.  ? zinc gluconate 50 MG tablet Take 1 tablet by mouth daily.  ? ?No facility-administered medications prior to visit.  ? ? ?Review of Systems ?Review of Systems:  ?A fourteen system review of systems was performed and found to be positive as per HPI. ? ? ?  Objective  ?  ?BP 115/70   Pulse 66   Temp 98.7 ?F (37.1 ?C)   Ht 5' 7"  (1.702 m)   Wt 258 lb (117 kg)   SpO2 98%   BMI 40.41 kg/m?  ? ? ?Physical Exam ?Constitutional:   ?   General: She is not in acute distress. ?   Appearance: She is ill-appearing.  ?HENT:  ?   Head: Normocephalic and atraumatic.  ?   Right Ear: Ear canal normal. No drainage or swelling. A middle ear effusion is present. Tympanic membrane is not erythematous.  ?   Left Ear: Ear canal normal. No drainage or swelling. A middle ear effusion is present. Tympanic membrane is not erythematous.  ?   Nose: Congestion present.  ?   Right Turbinates: Swollen and pale.  ?   Left Turbinates: Swollen.  ?   Right Sinus: Maxillary sinus tenderness and frontal sinus tenderness present.  ?   Left Sinus: Maxillary sinus tenderness and frontal sinus tenderness present.  ?   Mouth/Throat:  ?  Mouth: Mucous membranes are dry. No oral lesions.  ?   Pharynx: Posterior oropharyngeal erythema present. No oropharyngeal exudate.  ?   Tonsils: No tonsillar exudate.  ?Eyes:  ?   Conjunctiva/sclera: Conjunctivae normal.  ?   Pupils: Pupils are equal, round, and reactive to light.  ?Cardiovascular:  ?   Rate and Rhythm: Normal rate and regular rhythm.  ?   Heart sounds: Normal heart sounds.  ?Pulmonary:  ?   Effort: No respiratory distress.  ?   Breath sounds: Rhonchi present. No wheezing or rales.  ?Musculoskeletal:  ?   Cervical back: Neck supple.  ?Lymphadenopathy:  ?   Cervical: Cervical adenopathy present.  ?Skin: ?   General: Skin is warm and dry.  ?Neurological:  ?   General: No focal deficit present.  ?   Mental Status: She is alert.  ?  ? ? ?No  results found for any visits on 05/12/21. ? Assessment & Plan  ?  ? ?Patient has s/s consistent with sinusitis, symptoms started >7 days ago and given worsening symptoms will start antibiotic therapy with Azithromycin. Recommend to continue with prednisone and tessalon perles. Also recommend decongestant and saline nasal rinses. Follow-up if symptoms fail to improve or worsen.  ? ? ?Return if symptoms worsen or fail to improve.  ?   ? ? ? ?Lorrene Reid, PA-C  ?[facility] at [facility] ?[phone] (phone) ?[phone] (fax) ? ?[facility] ?

## 2021-05-13 ENCOUNTER — Other Ambulatory Visit: Payer: Self-pay

## 2021-05-14 ENCOUNTER — Ambulatory Visit: Payer: BC Managed Care – PPO | Admitting: Nurse Practitioner

## 2021-05-15 ENCOUNTER — Encounter: Payer: Self-pay | Admitting: Physician Assistant

## 2021-05-16 ENCOUNTER — Other Ambulatory Visit: Payer: Self-pay

## 2021-05-16 DIAGNOSIS — J209 Acute bronchitis, unspecified: Secondary | ICD-10-CM

## 2021-05-16 DIAGNOSIS — B379 Candidiasis, unspecified: Secondary | ICD-10-CM

## 2021-05-16 MED ORDER — PREDNISONE 10 MG (21) PO TBPK: ORAL_TABLET | ORAL | 0 refills | Status: DC

## 2021-05-16 MED ORDER — FLUCONAZOLE 150 MG PO TABS: 150.0000 mg | ORAL_TABLET | Freq: Once | ORAL | 0 refills | Status: AC

## 2021-05-16 MED ORDER — AMOXICILLIN-POT CLAVULANATE 875-125 MG PO TABS: 1.0000 | ORAL_TABLET | Freq: Two times a day (BID) | ORAL | 0 refills | Status: DC

## 2021-05-19 ENCOUNTER — Encounter: Payer: Self-pay | Admitting: Internal Medicine

## 2021-05-20 ENCOUNTER — Telehealth: Payer: Self-pay | Admitting: Internal Medicine

## 2021-05-20 NOTE — Telephone Encounter (Signed)
Patient started antibiotics on 05/22/2021 for sinusitis ?Given that her procedure is 10 days after beginning therapy for sinusitis, if her upper respiratory and sinus symptoms have significantly improved, and if she is having no issues with her breathing, significant drainage, fever or chills, okay to proceed as scheduled ?If any of the above symptoms reschedule the procedure to a later date ?Procedure remains indicated for variceal screening ?

## 2021-05-20 NOTE — Telephone Encounter (Signed)
Dr. Hilarie Fredrickson, please advise. ?

## 2021-05-20 NOTE — Telephone Encounter (Signed)
Spoke to patient, read message from Dr. Hilarie Fredrickson, patient is not having any of the issues listed by Dr. Hilarie Fredrickson and will proceed with procedure as planned. ?

## 2021-05-20 NOTE — Telephone Encounter (Signed)
Inbound call from patient. Have procedure 3/30. Have a sinus infection and currently on prednisone and Augmentin. Would like to know if it will be okay to continue with procedure or if she should reschedule? ?

## 2021-05-21 ENCOUNTER — Telehealth: Payer: Self-pay | Admitting: Internal Medicine

## 2021-05-21 NOTE — Telephone Encounter (Signed)
Rescheduled patient appointment for 07/02/2021 @ 10:00am with Dr. Hilarie Fredrickson.  ?

## 2021-05-22 ENCOUNTER — Encounter: Payer: BC Managed Care – PPO | Admitting: Internal Medicine

## 2021-05-26 ENCOUNTER — Ambulatory Visit: Payer: BC Managed Care – PPO | Admitting: Nurse Practitioner

## 2021-05-27 ENCOUNTER — Encounter: Payer: Self-pay | Admitting: Nurse Practitioner

## 2021-05-27 ENCOUNTER — Ambulatory Visit (INDEPENDENT_AMBULATORY_CARE_PROVIDER_SITE_OTHER): Payer: BC Managed Care – PPO | Admitting: Nurse Practitioner

## 2021-05-27 VITALS — BP 106/68 | HR 73 | Temp 96.9°F | Ht 66.93 in | Wt 258.8 lb

## 2021-05-27 DIAGNOSIS — E1159 Type 2 diabetes mellitus with other circulatory complications: Secondary | ICD-10-CM

## 2021-05-27 DIAGNOSIS — E119 Type 2 diabetes mellitus without complications: Secondary | ICD-10-CM | POA: Diagnosis not present

## 2021-05-27 DIAGNOSIS — I152 Hypertension secondary to endocrine disorders: Secondary | ICD-10-CM

## 2021-05-27 DIAGNOSIS — K760 Fatty (change of) liver, not elsewhere classified: Secondary | ICD-10-CM

## 2021-05-27 DIAGNOSIS — E039 Hypothyroidism, unspecified: Secondary | ICD-10-CM | POA: Diagnosis not present

## 2021-05-27 LAB — POCT GLYCOSYLATED HEMOGLOBIN (HGB A1C): Hemoglobin A1C: 6.2 % — AB (ref 4.0–5.6)

## 2021-05-27 MED ORDER — FLUCONAZOLE 150 MG PO TABS: 150.0000 mg | ORAL_TABLET | Freq: Once | ORAL | 0 refills | Status: AC

## 2021-05-27 MED ORDER — SITAGLIPTIN PHOSPHATE 25 MG PO TABS: 25.0000 mg | ORAL_TABLET | Freq: Every day | ORAL | 2 refills | Status: DC

## 2021-05-27 NOTE — Progress Notes (Signed)
Established patient visit ? ? ?Patient: Miranda Delacruz   DOB: 1960/04/09   61 y.o. Female  MRN: [id number] ?Visit Date: 05/27/2021 ? ? ?Chief Complaint  ?Patient presents with  ? Follow-up  ? Diabetes  ? ?Subjective  ?  ?Diabetes ?Pertinent negatives for hypoglycemia include no dizziness, headaches or nervousness/anxiousness. Pertinent negatives for diabetes include no chest pain, no fatigue, no polydipsia, no polyuria and no weakness.   ?The patient is here for for follow up visit.  ?-went to the dentist earlier today. Had pressure left side of face, just above where a root calan was done earlier. Dentist did imaging studies. Has sinus infection. Was started on levaquin earlier today.  ?-dealing with cirrhosis. GI provider has scheduled her for endoscopy which is now scheduled for May, 2023.  ?-currently on metformin and [place] daily. Hgba1c is 6.2 today. She has been on mounjaro per her endocrinology at one point. Came off of this due to not wanting to use injectable.  ?-currently on euthyroid due to history of papillary thyroid cancer. This is well managed.  ?-blood pressure doing well.  ?-no other concerns or complaints today ? ? ?Medications: ?Outpatient Medications Prior to Visit  ?Medication Sig  ? amLODipine (NORVASC) 5 MG tablet Take 1 tablet by mouth once daily  ? benzonatate (TESSALON) 200 MG capsule Take 1 capsule (200 mg total) by mouth 2 (two) times daily as needed for cough.  ? Cholecalciferol (VITAMIN D3) 125 MCG (5000 UT) CAPS   ? Coenzyme Q10 200 MG capsule Take 1 capsule by mouth daily.  ? diclofenac Sodium (VOLTAREN) 1 % GEL Apply 4 g topically 4 (four) times daily.  ? hydrochlorothiazide (MICROZIDE) 12.5 MG capsule Take 1 capsule by mouth once daily  ? levothyroxine (EUTHYROX) 150 MCG tablet Take 1 tablet (150 mcg total) by mouth daily before breakfast.  ? magnesium oxide (MAG-OX) 400 MG tablet Take 2 tablets by mouth daily.  ? Menaquinone-7 (VITAMIN K2) 100 MCG CAPS   ? metFORMIN (GLUCOPHAGE)  500 MG tablet TAKE 1 TABLET BY MOUTH TWICE DAILY WITH MEALS  ? metoprolol succinate (TOPROL-XL) 100 MG 24 hr tablet Take 1 tablet by mouth twice daily  ? milk thistle 175 MG tablet Take 175 mg by mouth daily.  ? valsartan (DIOVAN) 160 MG tablet Take 1 tablet by mouth once daily  ? vitamin B-12 (CYANOCOBALAMIN) 1000 MCG tablet Take 1,000 mcg by mouth daily.  ? zinc gluconate 50 MG tablet Take 1 tablet by mouth daily.  ? [DISCONTINUED] sitaGLIPtin (JANUVIA) 25 MG tablet Take 1 tablet (25 mg total) by mouth daily.  ? [DISCONTINUED] amoxicillin-clavulanate (AUGMENTIN) 875-125 MG tablet Take 1 tablet by mouth 2 (two) times daily.  ? [DISCONTINUED] etodolac (LODINE) 400 MG tablet Take 1 tablet by mouth twice daily  ? [DISCONTINUED] fluconazole (DIFLUCAN) 150 MG tablet Take 150 mg by mouth once.  ? [DISCONTINUED] predniSONE (STERAPRED UNI-PAK 21 TAB) 10 MG (21) TBPK tablet Use as directed  ? ?No facility-administered medications prior to visit.  ? ? ?Review of Systems  ?Constitutional:  Negative for activity change, appetite change, chills, fatigue and fever.  ?HENT:  Positive for congestion, postnasal drip, rhinorrhea, sinus pressure and sinus pain. Negative for sneezing and sore throat.   ?Eyes: Negative.   ?Respiratory:  Negative for cough, chest tightness, shortness of breath and wheezing.   ?Cardiovascular:  Negative for chest pain and palpitations.  ?Gastrointestinal:  Negative for abdominal pain, constipation, diarrhea, nausea and vomiting.  ?Endocrine: Negative for cold intolerance, heat intolerance,  polydipsia and polyuria.  ?     Well managed type 2 diabetes. History of papillary thyroid carcinoma.   ?Genitourinary:  Negative for dyspareunia, dysuria, flank pain, frequency and urgency.  ?Musculoskeletal:  Negative for arthralgias, back pain and myalgias.  ?Skin:  Negative for rash.  ?Allergic/Immunologic: Negative for environmental allergies.  ?Neurological:  Negative for dizziness, weakness and headaches.   ?Hematological:  Negative for adenopathy.  ?Psychiatric/Behavioral:  The patient is not nervous/anxious.   ? ? ? ? Objective  ?  ? ?Today's Vitals  ? 05/27/21 1312  ?BP: 106/68  ?Pulse: 73  ?Temp: (!) 96.9 ?F (36.1 ?C)  ?SpO2: 94%  ?Weight: 258 lb 12.8 oz (117.4 kg)  ?Height: 5' 6.93" (1.7 m)  ? ?Body mass index is 40.62 kg/m?.  ? ?BP Readings from Last 3 Encounters:  ?05/27/21 106/68  ?05/12/21 115/70  ?04/08/21 120/66  ?  ?Wt Readings from Last 3 Encounters:  ?05/27/21 258 lb 12.8 oz (117.4 kg)  ?05/12/21 258 lb (117 kg)  ?04/08/21 257 lb 2 oz (116.6 kg)  ?  ?Physical Exam ?Vitals and nursing note reviewed.  ?Constitutional:   ?   Appearance: Normal appearance. She is well-developed.  ?HENT:  ?   Head: Normocephalic and atraumatic.  ?Eyes:  ?   Pupils: Pupils are equal, round, and reactive to light.  ?Cardiovascular:  ?   Rate and Rhythm: Normal rate and regular rhythm.  ?   Pulses: Normal pulses.  ?   Heart sounds: Normal heart sounds.  ?Pulmonary:  ?   Effort: Pulmonary effort is normal.  ?   Breath sounds: Normal breath sounds.  ?Abdominal:  ?   Palpations: Abdomen is soft.  ?Musculoskeletal:     ?   General: Normal range of motion.  ?   Cervical back: Normal range of motion and neck supple.  ?Lymphadenopathy:  ?   Cervical: No cervical adenopathy.  ?Skin: ?   General: Skin is warm and dry.  ?   Capillary Refill: Capillary refill takes less than 2 seconds.  ?Neurological:  ?   General: No focal deficit present.  ?   Mental Status: She is alert and oriented to person, place, and time.  ?Psychiatric:     ?   Mood and Affect: Mood normal.     ?   Behavior: Behavior normal.     ?   Thought Content: Thought content normal.     ?   Judgment: Judgment normal.  ?  ? ?Results for orders placed or performed in visit on 05/27/21  ?POCT glycosylated hemoglobin (Hb A1C)  ?Result Value Ref Range  ? Hemoglobin A1C 6.2 (A) 4.0 - 5.6 %  ? HbA1c POC (<> result, manual entry)    ? HbA1c, POC (prediabetic range)    ? HbA1c, POC  (controlled diabetic range)    ? ? Assessment & Plan  ?  ?1. Type 2 diabetes mellitus without complication, without long-term current use of insulin ([place]) ?HgbA1c 6.2 today. Continue current dosing of current medications. Discussed starting back Mounjaro to help with suar control and weight management. She would like to hold off on this for now. Will manage with current medications. She will limit intake of carbohydrates and sugar and plans to start exercising routinely.  ?- POCT glycosylated hemoglobin (Hb A1C) ?- sitaGLIPtin (JANUVIA) 25 MG tablet; Take 1 tablet (25 mg total) by mouth daily.  Dispense: 30 tablet; Refill: 2 ? ?2. Hypothyroidism, unspecified type ?Stable. Continue levothyroxine as prescribed  ? ?3.  Hypertension associated with diabetes ([place]) ?Stable. Continue bp medication as prescribed  ? ?4. Hepatic steatosis ?Conitnue regular visits with GI as scheduled.   ?  ?Problem List Items Addressed This Visit   ? ?  ? Cardiovascular and Mediastinum  ? Hypertension associated with diabetes ([place])  ? Relevant Medications  ? sitaGLIPtin (JANUVIA) 25 MG tablet  ?  ? Digestive  ? Hepatic steatosis  ?  ? Endocrine  ? Hypothyroidism (Chronic)  ? Type 2 diabetes mellitus without complication, without long-term current use of insulin ([place]) - Primary  ? Relevant Medications  ? sitaGLIPtin (JANUVIA) 25 MG tablet  ? Other Relevant Orders  ? POCT glycosylated hemoglobin (Hb A1C) (Completed)  ?  ? ?Return in about 3 months (around 08/26/2021) for diabetes with HgbA1c check.  ?   ? ? ? ? ?Ronnell Freshwater, NP  ?[facility] at [facility] ?[phone] (phone) ?[phone] (fax) ? ?[facility]  ?

## 2021-07-02 ENCOUNTER — Encounter: Payer: Self-pay | Admitting: Internal Medicine

## 2021-07-02 ENCOUNTER — Other Ambulatory Visit: Payer: Self-pay | Admitting: Internal Medicine

## 2021-07-02 ENCOUNTER — Ambulatory Visit (AMBULATORY_SURGERY_CENTER): Payer: BC Managed Care – PPO | Admitting: Internal Medicine

## 2021-07-02 VITALS — BP 116/76 | HR 67 | Temp 97.3°F | Resp 19 | Ht 67.0 in | Wt 257.0 lb

## 2021-07-02 DIAGNOSIS — K7460 Unspecified cirrhosis of liver: Secondary | ICD-10-CM

## 2021-07-02 DIAGNOSIS — K760 Fatty (change of) liver, not elsewhere classified: Secondary | ICD-10-CM

## 2021-07-02 DIAGNOSIS — K2970 Gastritis, unspecified, without bleeding: Secondary | ICD-10-CM | POA: Diagnosis not present

## 2021-07-02 DIAGNOSIS — R932 Abnormal findings on diagnostic imaging of liver and biliary tract: Secondary | ICD-10-CM | POA: Diagnosis not present

## 2021-07-02 DIAGNOSIS — K3189 Other diseases of stomach and duodenum: Secondary | ICD-10-CM | POA: Diagnosis not present

## 2021-07-02 DIAGNOSIS — R7989 Other specified abnormal findings of blood chemistry: Secondary | ICD-10-CM

## 2021-07-02 DIAGNOSIS — K769 Liver disease, unspecified: Secondary | ICD-10-CM

## 2021-07-02 MED ORDER — SODIUM CHLORIDE 0.9 % IV SOLN: 500.0000 mL | Freq: Once | INTRAVENOUS | Status: DC

## 2021-07-02 NOTE — Progress Notes (Signed)
Pt is aware F/U appt with Dr. Hilarie Fredrickson is 08-28-21, Thursday, at 10:30 am ?

## 2021-07-02 NOTE — Progress Notes (Signed)
? ?GASTROENTEROLOGY PROCEDURE H&P NOTE  ? ?Primary Care Physician: ?Ronnell Freshwater, NP ? ? ? ?Reason for Procedure:  Abnormal liver imaging, concern for cirrhosis with portal hypertension ? ?Plan:    EGD for variceal screening ? ?Patient is appropriate for endoscopic procedure(s) in the ambulatory ([place]) setting. ? ?The nature of the procedure, as well as the risks, benefits, and alternatives were carefully and thoroughly reviewed with the patient. Ample time for discussion and questions allowed. The patient understood, was satisfied, and agreed to proceed.  ? ? ? ?HPI: ?Miranda Delacruz is a 61 y.o. female who presents for EGD.  Medical history as below.   No recent chest pain or shortness of breath.  No abdominal pain today. ? ?Past Medical History:  ?Diagnosis Date  ? Colon polyps   ? Diabetes mellitus without complication ([place])   ? Gallstones   ? GERD (gastroesophageal reflux disease)   ? Hyperlipidemia   ? Hypertension   ? Hypothyroidism   ? Thyroid cancer ([place])   ? thyroid  ? ? ?Past Surgical History:  ?Procedure Laterality Date  ? COLONOSCOPY  2000  ? normal  ? COLONOSCOPY    ? COLONOSCOPY WITH PROPOFOL N/A 01/22/2015  ? Procedure: COLONOSCOPY WITH PROPOFOL;  Surgeon: Lucilla Lame, MD;  Location: [facility] ENDOSCOPY;  Service: Endoscopy;  Laterality: N/A;  ? POLYPECTOMY    ? THYROIDECTOMY  2009  ? TONSILLECTOMY AND ADENOIDECTOMY  1976  ? TUBAL LIGATION  1997  ? ? ?Prior to Admission medications   ?Medication Sig Start Date End Date Taking? Authorizing Provider  ?amLODipine (NORVASC) 5 MG tablet Take 1 tablet by mouth once daily 12/31/20  Yes Lorrene Reid, PA-C  ?Cholecalciferol (VITAMIN D3) 125 MCG (5000 UT) CAPS  09/26/18  Yes [provider]  ?Coenzyme Q10 200 MG capsule Take 1 capsule by mouth daily. 05/24/16  Yes [provider]  ?hydrochlorothiazide (MICROZIDE) 12.5 MG capsule Take 1 capsule by mouth once daily 12/31/20  Yes Abonza, Maritza, PA-C  ?levothyroxine (EUTHYROX) 150 MCG tablet Take  1 tablet (150 mcg total) by mouth daily before breakfast. 02/25/21  Yes Boscia, Heather E, NP  ?magnesium oxide (MAG-OX) 400 MG tablet Take 2 tablets by mouth daily.   Yes [provider]  ?Menaquinone-7 (VITAMIN K2) 100 MCG CAPS  09/26/18  Yes [provider]  ?metFORMIN (GLUCOPHAGE) 500 MG tablet TAKE 1 TABLET BY MOUTH TWICE DAILY WITH MEALS 02/01/19  Yes Opalski, Neoma Laming, DO  ?metoprolol succinate (TOPROL-XL) 100 MG 24 hr tablet Take 1 tablet by mouth twice daily 12/31/20  Yes Abonza, Maritza, PA-C  ?milk thistle 175 MG tablet Take 175 mg by mouth daily.   Yes [provider]  ?sitaGLIPtin (JANUVIA) 25 MG tablet Take 1 tablet (25 mg total) by mouth daily. 05/27/21  Yes Ronnell Freshwater, NP  ?valsartan (DIOVAN) 160 MG tablet Take 1 tablet by mouth once daily 12/31/20  Yes Abonza, Maritza, PA-C  ?vitamin B-12 (CYANOCOBALAMIN) 1000 MCG tablet Take 1,000 mcg by mouth daily.   Yes [provider]  ?zinc gluconate 50 MG tablet Take 1 tablet by mouth daily.   Yes [provider]  ?benzonatate (TESSALON) 200 MG capsule Take 1 capsule (200 mg total) by mouth 2 (two) times daily as needed for cough. 05/11/21   Sharion Balloon, FNP  ?diclofenac Sodium (VOLTAREN) 1 % GEL Apply 4 g topically 4 (four) times daily. 05/21/20   Ronnell Freshwater, NP  ? ? ?Current Outpatient Medications  ?Medication Sig Dispense Refill  ?  amLODipine (NORVASC) 5 MG tablet Take 1 tablet by mouth once daily 90 tablet 1  ? Cholecalciferol (VITAMIN D3) 125 MCG (5000 UT) CAPS     ? Coenzyme Q10 200 MG capsule Take 1 capsule by mouth daily.    ? hydrochlorothiazide (MICROZIDE) 12.5 MG capsule Take 1 capsule by mouth once daily 90 capsule 1  ? levothyroxine (EUTHYROX) 150 MCG tablet Take 1 tablet (150 mcg total) by mouth daily before breakfast. 30 tablet 3  ? magnesium oxide (MAG-OX) 400 MG tablet Take 2 tablets by mouth daily.    ? Menaquinone-7 (VITAMIN K2) 100 MCG CAPS     ? metFORMIN (GLUCOPHAGE) 500 MG tablet TAKE  1 TABLET BY MOUTH TWICE DAILY WITH MEALS 180 tablet 0  ? metoprolol succinate (TOPROL-XL) 100 MG 24 hr tablet Take 1 tablet by mouth twice daily 180 tablet 1  ? milk thistle 175 MG tablet Take 175 mg by mouth daily.    ? sitaGLIPtin (JANUVIA) 25 MG tablet Take 1 tablet (25 mg total) by mouth daily. 30 tablet 2  ? valsartan (DIOVAN) 160 MG tablet Take 1 tablet by mouth once daily 90 tablet 1  ? vitamin B-12 (CYANOCOBALAMIN) 1000 MCG tablet Take 1,000 mcg by mouth daily.    ? zinc gluconate 50 MG tablet Take 1 tablet by mouth daily.    ? benzonatate (TESSALON) 200 MG capsule Take 1 capsule (200 mg total) by mouth 2 (two) times daily as needed for cough. 20 capsule 0  ? diclofenac Sodium (VOLTAREN) 1 % GEL Apply 4 g topically 4 (four) times daily. 100 g 2  ? ?Current Facility-Administered Medications  ?Medication Dose Route Frequency Provider Last Rate Last Admin  ? 0.9 %  sodium chloride infusion  500 mL Intravenous Once Pete Merten, Lajuan Lines, MD      ? ? ?Allergies as of 07/02/2021  ? (No Known Allergies)  ? ? ?Family History  ?Problem Relation Age of Onset  ? Hypertension Mother   ? Hypertension Maternal Grandmother   ? Stroke Maternal Grandmother   ? Colon polyps Neg Hx   ? Colon cancer Neg Hx   ? Esophageal cancer Neg Hx   ? Rectal cancer Neg Hx   ? Stomach cancer Neg Hx   ? ? ?Social History  ? ?Socioeconomic History  ? Marital status: Married  ?  Spouse name: Not on file  ? Number of children: 2  ? Years of education: Not on file  ? Highest education level: Not on file  ?Occupational History  ? Not on file  ?Tobacco Use  ? Smoking status: Former  ?  Types: Cigarettes  ?  Quit date: 02/24/1987  ?  Years since quitting: 34.3  ? Smokeless tobacco: Never  ?Vaping Use  ? Vaping Use: Never used  ?Substance and Sexual Activity  ? Alcohol use: Yes  ?  Comment: 5 per day  ? Drug use: No  ? Sexual activity: Yes  ?  Birth control/protection: Post-menopausal  ?Other Topics Concern  ? Not on file  ?Social History Narrative  ? Not on  file  ? ?Social Determinants of Health  ? ?Financial Resource Strain: Not on file  ?Food Insecurity: Not on file  ?Transportation Needs: Not on file  ?Physical Activity: Not on file  ?Stress: Not on file  ?Social Connections: Not on file  ?Intimate Partner Violence: Not on file  ? ? ?Physical Exam: ?Vital signs in last 24 hours: ?@BP  (!) 120/58   Pulse 74  Temp (!) 97.3 ?F (36.3 ?C)   Ht 5' 7"  (1.702 m)   Wt 257 lb (116.6 kg)   SpO2 95%   BMI 40.25 kg/m?  ?GEN: NAD ?EYE: Sclerae anicteric ?ENT: MMM ?CV: Non-tachycardic ?Pulm: CTA b/l ?GI: Soft, NT/ND ?NEURO:  Alert & Oriented x 3 ? ? ?Zenovia Jarred, MD ?[facility] ? ?07/02/2021 9:52 AM ? ?

## 2021-07-02 NOTE — Op Note (Signed)
[place] ?Patient Name: Miranda Delacruz ?Procedure Date: 07/02/2021 9:53 AM ?MRN: [id number] ?Endoscopist: Jerene Bears , MD ?Age: 61 ?Referring MD:  ?Date of Birth: 04-14-1960 ?Gender: Female ?Account #: [id number] ?Procedure:                Upper GI endoscopy ?Indications:              Abnormal ultrasound of the GI tract suggesting  ?                          cirrhosis with fatty liver, Cirrhosis rule out  ?                          esophageal varices ?Medicines:                Monitored Anesthesia Care ?Procedure:                Pre-Anesthesia Assessment: ?                          - Prior to the procedure, a History and Physical  ?                          was performed, and patient medications and  ?                          allergies were reviewed. The patient's tolerance of  ?                          previous anesthesia was also reviewed. The risks  ?                          and benefits of the procedure and the sedation  ?                          options and risks were discussed with the patient.  ?                          All questions were answered, and informed consent  ?                          was obtained. Prior Anticoagulants: The patient has  ?                          taken no previous anticoagulant or antiplatelet  ?                          agents. ASA Grade Assessment: II - A patient with  ?                          mild systemic disease. After reviewing the risks  ?                          and benefits, the patient was deemed in  ?  satisfactory condition to undergo the procedure. ?                          After obtaining informed consent, the endoscope was  ?                          passed under direct vision. Throughout the  ?                          procedure, the patient's blood pressure, pulse, and  ?                          oxygen saturations were monitored continuously. The  ?                          Endoscope was introduced through the mouth,  and  ?                          advanced to the second part of duodenum. The upper  ?                          GI endoscopy was accomplished without difficulty.  ?                          The patient tolerated the procedure well. ?Scope In: ?Scope Out: ?Findings:                 The examined esophagus was normal. ?                          There is no endoscopic evidence of varices in the  ?                          lower third of the esophagus. ?                          The cardia and gastric fundus were normal on  ?                          retroflexion. ?                          Mild inflammation characterized by erythema and  ?                          granularity was found in the gastric antrum.  ?                          Biopsies were taken with a cold forceps for  ?                          histology and Helicobacter pylori testing. ?                          The examined duodenum was normal. ?Complications:  No immediate complications. ?Estimated Blood Loss:     Estimated blood loss was minimal. ?Impression:               - Normal esophagus. ?                          - Gastritis. Biopsied. ?                          - Normal examined duodenum. ?Recommendation:           - Patient has a contact number available for  ?                          emergencies. The signs and symptoms of potential  ?                          delayed complications were discussed with the  ?                          patient. Return to normal activities tomorrow.  ?                          Written discharge instructions were provided to the  ?                          patient. ?                          - Resume previous diet. Remain completely alcohol  ?                          abstinent. ?                          - Continue present medications. ?                          - Await pathology results. ?                          - MRI abd with contrast ([place] protocol) in July 2023. ?                          - Office  visit this summer with CBC, CMP, INR 1  ?                          week before. ?                          - Repeat upper endoscopy in 2 years for screening  ?                          purposes. ?Jerene Bears, MD ?07/02/2021 10:10:37 AM ?This report has been signed electronically. ?

## 2021-07-02 NOTE — Progress Notes (Signed)
Report to pacu rn; vss. ?

## 2021-07-02 NOTE — Patient Instructions (Addendum)
Await pathology ? ?Please read over handout about gastritis ? ?Continue your normal medications ? ?Remain completely alcohol abstinent ? ?Next upper endoscopy in 2 years ? ?Office visit 08-26-21 at 10:30 - will need lab work done 1 week before ? ? ? ?YOU HAD AN ENDOSCOPIC PROCEDURE TODAY AT [facility]:   Refer to the procedure report that was given to you for any specific questions about what was found during the examination.  If the procedure report does not answer your questions, please call your gastroenterologist to clarify.  If you requested that your care partner not be given the details of your procedure findings, then the procedure report has been included in a sealed envelope for you to review at your convenience later. ? ?YOU SHOULD EXPECT: Some feelings of bloating in the abdomen. Passage of more gas than usual.  Walking can help get rid of the air that was put into your GI tract during the procedure and reduce the bloating.  ?Please Note:  You might notice some irritation and congestion in your nose or some drainage.  This is from the oxygen used during your procedure.  There is no need for concern and it should clear up in a day or so. ? ?SYMPTOMS TO REPORT IMMEDIATELY: ? ?Following upper endoscopy (EGD) ? Vomiting of blood or coffee ground material ? New chest pain or pain under the shoulder blades ? Painful or persistently difficult swallowing ? New shortness of breath ? Fever of 100?F or higher ? Black, tarry-looking stools ? ?For urgent or emergent issues, a gastroenterologist can be reached at any hour by calling [phone]. ?Do not use MyChart messaging for urgent concerns.  ? ? ?DIET:  We do recommend a small meal at first, but then you may proceed to your regular diet.  Drink plenty of fluids but you should avoid alcoholic beverages for 24 hours. ? ?ACTIVITY:  You should plan to take it easy for the rest of today and you should NOT DRIVE or use heavy machinery until tomorrow  (because of the sedation medicines used during the test).   ? ?FOLLOW UP: ?Our staff will call the number listed on your records 48-72 hours following your procedure to check on you and address any questions or concerns that you may have regarding the information given to you following your procedure. If we do not reach you, we will leave a message.  We will attempt to reach you two times.  During this call, we will ask if you have developed any symptoms of COVID 19. If you develop any symptoms (ie: fever, flu-like symptoms, shortness of breath, cough etc.) before then, please call [phone].  If you test positive for Covid 19 in the 2 weeks post procedure, please call and report this information to [place].   ? ?If any biopsies were taken you will be contacted by phone or by letter within the next 1-3 weeks.  Please call us at [phone] if you have not heard about the biopsies in 3 weeks.  ? ? ?SIGNATURES/CONFIDENTIALITY: ?You and/or your care partner have signed paperwork which will be entered into your electronic medical record.  These signatures attest to the fact that that the information above on your After Visit Summary has been reviewed and is understood.  Full responsibility of the confidentiality of this discharge information lies with you and/or your care-partner. ? ?

## 2021-07-04 ENCOUNTER — Telehealth: Payer: Self-pay | Admitting: *Deleted

## 2021-07-04 NOTE — Telephone Encounter (Signed)
?  Follow up Call- ? ? ?  07/02/2021  ?  9:14 AM 05/20/2020  ?  1:51 PM  ?Call back number  ?Post procedure Call Back phone  # [phone]  ?Permission to leave phone message Yes Yes  ?  ? ?Patient questions: ? ?Do you have a fever, pain , or abdominal swelling? No. ?Pain Score  0 * ? ?Have you tolerated food without any problems? Yes.   ? ?Have you been able to return to your normal activities? Yes.   ? ?Do you have any questions about your discharge instructions: ?Diet   No. ?Medications  No. ?Follow up visit  No. ? ?Do you have questions or concerns about your Care? No. ? ?Actions: ?* If pain score is 4 or above: ?No action needed, pain <4. ? ? ?

## 2021-07-07 ENCOUNTER — Encounter: Payer: Self-pay | Admitting: Internal Medicine

## 2021-07-19 ENCOUNTER — Encounter: Payer: Self-pay | Admitting: Nurse Practitioner

## 2021-07-22 ENCOUNTER — Other Ambulatory Visit: Payer: Self-pay

## 2021-07-22 DIAGNOSIS — E039 Hypothyroidism, unspecified: Secondary | ICD-10-CM

## 2021-07-22 MED ORDER — LEVOTHYROXINE SODIUM 150 MCG PO TABS: 150.0000 ug | ORAL_TABLET | Freq: Every day | ORAL | 3 refills | Status: DC

## 2021-07-31 ENCOUNTER — Other Ambulatory Visit: Payer: Self-pay | Admitting: Physician Assistant

## 2021-07-31 DIAGNOSIS — I152 Hypertension secondary to endocrine disorders: Secondary | ICD-10-CM

## 2021-08-15 ENCOUNTER — Encounter: Payer: Self-pay | Admitting: Internal Medicine

## 2021-08-18 ENCOUNTER — Ambulatory Visit (INDEPENDENT_AMBULATORY_CARE_PROVIDER_SITE_OTHER): Payer: BC Managed Care – PPO | Admitting: Nurse Practitioner

## 2021-08-18 ENCOUNTER — Encounter: Payer: Self-pay | Admitting: Nurse Practitioner

## 2021-08-18 VITALS — BP 115/72 | HR 68 | Temp 97.7°F | Ht 66.93 in | Wt 255.6 lb

## 2021-08-18 DIAGNOSIS — K760 Fatty (change of) liver, not elsewhere classified: Secondary | ICD-10-CM

## 2021-08-18 DIAGNOSIS — E1159 Type 2 diabetes mellitus with other circulatory complications: Secondary | ICD-10-CM

## 2021-08-18 DIAGNOSIS — E039 Hypothyroidism, unspecified: Secondary | ICD-10-CM

## 2021-08-18 DIAGNOSIS — E119 Type 2 diabetes mellitus without complications: Secondary | ICD-10-CM | POA: Diagnosis not present

## 2021-08-18 DIAGNOSIS — I152 Hypertension secondary to endocrine disorders: Secondary | ICD-10-CM

## 2021-08-18 LAB — POCT GLYCOSYLATED HEMOGLOBIN (HGB A1C): Hemoglobin A1C: 6 % — AB (ref 4.0–5.6)

## 2021-08-18 MED ORDER — METOPROLOL SUCCINATE ER 100 MG PO TB24: 100.0000 mg | ORAL_TABLET | Freq: Two times a day (BID) | ORAL | 1 refills | Status: DC

## 2021-08-18 MED ORDER — AMLODIPINE BESYLATE 5 MG PO TABS: 5.0000 mg | ORAL_TABLET | Freq: Every day | ORAL | 1 refills | Status: DC

## 2021-08-18 MED ORDER — HYDROCHLOROTHIAZIDE 12.5 MG PO CAPS: 12.5000 mg | ORAL_CAPSULE | Freq: Every day | ORAL | 1 refills | Status: DC

## 2021-08-18 MED ORDER — SITAGLIPTIN PHOSPHATE 25 MG PO TABS: 25.0000 mg | ORAL_TABLET | Freq: Every day | ORAL | 1 refills | Status: DC

## 2021-08-18 MED ORDER — METFORMIN HCL 500 MG PO TABS: 500.0000 mg | ORAL_TABLET | Freq: Two times a day (BID) | ORAL | 1 refills | Status: DC

## 2021-08-18 MED ORDER — VALSARTAN 160 MG PO TABS: 160.0000 mg | ORAL_TABLET | Freq: Every day | ORAL | 1 refills | Status: DC

## 2021-08-18 NOTE — Progress Notes (Signed)
Established patient visit   Patient: Miranda Delacruz   DOB: 1961-01-17   61 y.o. Female  MRN: [id number] Visit Date: 08/18/2021   Chief Complaint  Patient presents with   Follow-up   Subjective    HPI  Routine follow up for dibetes -most recent HgbA1c 05/27/2021 - 6.2  -today, HgbA1c is 6.0  -needs diabetic eye exam -does need refills on routine medication. -nasal congestion with sneezing present. Feels ok otherwise.  --numbness and tingling in her feet. More numbness. More on left side. Did raise her feet last night while she slept. This did help. Does get regular adjustments per chiropractor. Denies injury to her feet or back recently.  -does see GYN for well woman care and gets mammogram every other year    Medications: Outpatient Medications Prior to Visit  Medication Sig   benzonatate (TESSALON) 200 MG capsule Take 1 capsule (200 mg total) by mouth 2 (two) times daily as needed for cough.   Cholecalciferol (VITAMIN D3) 125 MCG (5000 UT) CAPS    Coenzyme Q10 200 MG capsule Take 1 capsule by mouth daily.   diclofenac Sodium (VOLTAREN) 1 % GEL Apply 4 g topically 4 (four) times daily.   levothyroxine (EUTHYROX) 150 MCG tablet Take 1 tablet (150 mcg total) by mouth daily before breakfast.   magnesium oxide (MAG-OX) 400 MG tablet Take 2 tablets by mouth daily.   Menaquinone-7 (VITAMIN K2) 100 MCG CAPS    milk thistle 175 MG tablet Take 175 mg by mouth daily.   vitamin B-12 (CYANOCOBALAMIN) 1000 MCG tablet Take 1,000 mcg by mouth daily.   zinc gluconate 50 MG tablet Take 1 tablet by mouth daily.   [DISCONTINUED] amLODipine (NORVASC) 5 MG tablet Take 1 tablet by mouth once daily   [DISCONTINUED] hydrochlorothiazide (MICROZIDE) 12.5 MG capsule Take 1 capsule by mouth once daily   [DISCONTINUED] metFORMIN (GLUCOPHAGE) 500 MG tablet TAKE 1 TABLET BY MOUTH TWICE DAILY WITH MEALS   [DISCONTINUED] metoprolol succinate (TOPROL-XL) 100 MG 24 hr tablet Take 1 tablet by mouth twice daily    [DISCONTINUED] sitaGLIPtin (JANUVIA) 25 MG tablet Take 1 tablet (25 mg total) by mouth daily.   [DISCONTINUED] valsartan (DIOVAN) 160 MG tablet Take 1 tablet by mouth once daily   No facility-administered medications prior to visit.    Review of Systems  Constitutional:  Negative for activity change, appetite change, chills, fatigue and fever.  HENT:  Positive for postnasal drip and sneezing. Negative for congestion, rhinorrhea, sinus pressure, sinus pain and sore throat.   Eyes: Negative.  Negative for itching.  Respiratory:  Negative for cough, chest tightness, shortness of breath and wheezing.   Cardiovascular:  Negative for chest pain and palpitations.  Gastrointestinal:  Negative for abdominal pain, constipation, diarrhea, nausea and vomiting.  Endocrine: Negative for cold intolerance, heat intolerance, polydipsia and polyuria.       Blood sugars doing well    Genitourinary:  Negative for dyspareunia, dysuria, flank pain, frequency and urgency.  Musculoskeletal:  Negative for arthralgias, back pain and myalgias.  Skin:  Negative for rash.  Allergic/Immunologic: Positive for environmental allergies.  Neurological:  Positive for numbness. Negative for dizziness, weakness and headaches.       Bilateral feet, worse on left side   Hematological:  Negative for adenopathy.  Psychiatric/Behavioral:  The patient is not nervous/anxious.      Objective     Today's Vitals   08/18/21 1312  BP: 115/72  Pulse: 68  Temp: 97.7 F (36.5 C)  SpO2:  97%  Weight: 255 lb 9.6 oz (115.9 kg)  Height: 5' 6.93" (1.7 m)   Body mass index is 40.12 kg/m.   BP Readings from Last 3 Encounters:  08/18/21 115/72  07/02/21 116/76  05/27/21 106/68    Wt Readings from Last 3 Encounters:  08/18/21 255 lb 9.6 oz (115.9 kg)  07/02/21 257 lb (116.6 kg)  05/27/21 258 lb 12.8 oz (117.4 kg)    Physical Exam Vitals and nursing note reviewed.  Constitutional:      Appearance: Normal appearance. She is  well-developed.  HENT:     Head: Normocephalic.  Eyes:     Pupils: Pupils are equal, round, and reactive to light.  Cardiovascular:     Rate and Rhythm: Normal rate and regular rhythm.     Pulses: Normal pulses.     Heart sounds: Normal heart sounds.  Pulmonary:     Effort: Pulmonary effort is normal.     Breath sounds: Normal breath sounds.  Abdominal:     Palpations: Abdomen is soft.  Musculoskeletal:        General: Normal range of motion.     Cervical back: Normal range of motion and neck supple.  Lymphadenopathy:     Cervical: No cervical adenopathy.  Skin:    General: Skin is warm and dry.     Capillary Refill: Capillary refill takes less than 2 seconds.  Neurological:     General: No focal deficit present.     Mental Status: She is alert and oriented to person, place, and time.  Psychiatric:        Mood and Affect: Mood normal.        Behavior: Behavior normal.        Thought Content: Thought content normal.        Judgment: Judgment normal.     Results for orders placed or performed in visit on 08/18/21  POCT glycosylated hemoglobin (Hb A1C)  Result Value Ref Range   Hemoglobin A1C 6.0 (A) 4.0 - 5.6 %   HbA1c POC (<> result, manual entry)     HbA1c, POC (prediabetic range)     HbA1c, POC (controlled diabetic range)      Assessment & Plan    1. Type 2 diabetes mellitus without complication, without long-term current use of insulin (HCC) HgbA1c 6.0 today. Trial metformin 250 mg twice daily. Continue januvia as prescribed. Continue to consume a low sugar, low carbohydrate diet. Recheck HgbA1c in 3 months  - POCT glycosylated hemoglobin (Hb A1C) - metFORMIN (GLUCOPHAGE) 500 MG tablet; Take 1 tablet (500 mg total) by mouth 2 (two) times daily with a meal.  Dispense: 180 tablet; Refill: 1 - sitaGLIPtin (JANUVIA) 25 MG tablet; Take 1 tablet (25 mg total) by mouth daily.  Dispense: 90 tablet; Refill: 1  2. Hypertension associated with diabetes ([place]) Table. Continue  medication as prescribed  - amLODipine (NORVASC) 5 MG tablet; Take 1 tablet (5 mg total) by mouth daily.  Dispense: 90 tablet; Refill: 1 - hydrochlorothiazide (MICROZIDE) 12.5 MG capsule; Take 1 capsule (12.5 mg total) by mouth daily.  Dispense: 90 capsule; Refill: 1 - metoprolol succinate (TOPROL-XL) 100 MG 24 hr tablet; Take 1 tablet (100 mg total) by mouth 2 (two) times daily. Take with or immediately following a meal.  Dispense: 180 tablet; Refill: 1 - valsartan (DIOVAN) 160 MG tablet; Take 1 tablet (160 mg total) by mouth daily.  Dispense: 90 tablet; Refill: 1  3. Acquired hypothyroidism Thyroid panel stable. Continue levothyroxine 150  mcg daily   4. Hepatic steatosis Continue regular visits with GI as scheduled    Problem List Items Addressed This Visit       Cardiovascular and Mediastinum   Hypertension associated with diabetes ([place])   Relevant Medications   amLODipine (NORVASC) 5 MG tablet   hydrochlorothiazide (MICROZIDE) 12.5 MG capsule   metFORMIN (GLUCOPHAGE) 500 MG tablet   metoprolol succinate (TOPROL-XL) 100 MG 24 hr tablet   valsartan (DIOVAN) 160 MG tablet   sitaGLIPtin (JANUVIA) 25 MG tablet     Digestive   Hepatic steatosis     Endocrine   Hypothyroidism (Chronic)   Relevant Medications   metoprolol succinate (TOPROL-XL) 100 MG 24 hr tablet   Type 2 diabetes mellitus without complication, without long-term current use of insulin (HCC) - Primary   Relevant Medications   metFORMIN (GLUCOPHAGE) 500 MG tablet   valsartan (DIOVAN) 160 MG tablet   sitaGLIPtin (JANUVIA) 25 MG tablet   Other Relevant Orders   POCT glycosylated hemoglobin (Hb A1C) (Completed)     Return in about 3 months (around 11/18/2021) for health maintenance exam, check HgbA1c, FBW a week prior to visit.         Ronnell Freshwater, NP  [facility] [phone] (phone) [phone] (fax)  [place]

## 2021-08-20 ENCOUNTER — Other Ambulatory Visit (INDEPENDENT_AMBULATORY_CARE_PROVIDER_SITE_OTHER): Payer: BC Managed Care – PPO

## 2021-08-20 DIAGNOSIS — R7989 Other specified abnormal findings of blood chemistry: Secondary | ICD-10-CM | POA: Diagnosis not present

## 2021-08-20 DIAGNOSIS — K769 Liver disease, unspecified: Secondary | ICD-10-CM

## 2021-08-20 LAB — CBC WITH DIFFERENTIAL/PLATELET
Basophils Absolute: 0.1 10*3/uL (ref 0.0–0.1)
Basophils Relative: 0.9 % (ref 0.0–3.0)
Eosinophils Absolute: 0.1 10*3/uL (ref 0.0–0.7)
Eosinophils Relative: 1.2 % (ref 0.0–5.0)
HCT: 43.9 % (ref 36.0–46.0)
Hemoglobin: 15 g/dL (ref 12.0–15.0)
Lymphocytes Relative: 32.5 % (ref 12.0–46.0)
Lymphs Abs: 2.5 10*3/uL (ref 0.7–4.0)
MCHC: 34.3 g/dL (ref 30.0–36.0)
MCV: 92.7 fl (ref 78.0–100.0)
Monocytes Absolute: 0.5 10*3/uL (ref 0.1–1.0)
Monocytes Relative: 6.7 % (ref 3.0–12.0)
Neutro Abs: 4.5 10*3/uL (ref 1.4–7.7)
Neutrophils Relative %: 58.7 % (ref 43.0–77.0)
Platelets: 237 10*3/uL (ref 150.0–400.0)
RBC: 4.74 Mil/uL (ref 3.87–5.11)
RDW: 14.8 % (ref 11.5–15.5)
WBC: 7.8 10*3/uL (ref 4.0–10.5)

## 2021-08-20 LAB — COMPREHENSIVE METABOLIC PANEL
ALT: 71 U/L — ABNORMAL HIGH (ref 0–35)
AST: 40 U/L — ABNORMAL HIGH (ref 0–37)
Albumin: 4.9 g/dL (ref 3.5–5.2)
Alkaline Phosphatase: 50 U/L (ref 39–117)
BUN: 16 mg/dL (ref 6–23)
CO2: 25 mEq/L (ref 19–32)
Calcium: 10.3 mg/dL (ref 8.4–10.5)
Chloride: 100 mEq/L (ref 96–112)
Creatinine, Ser: 0.85 mg/dL (ref 0.40–1.20)
GFR: 75.15 mL/min (ref >60.00)
Glucose, Bld: 151 mg/dL — ABNORMAL HIGH (ref 70–99)
Potassium: 4.2 mEq/L (ref 3.5–5.1)
Sodium: 136 mEq/L (ref 135–145)
Total Bilirubin: 1 mg/dL (ref 0.2–1.2)
Total Protein: 7.2 g/dL (ref 6.0–8.3)

## 2021-08-20 LAB — PROTIME-INR
INR: 1 ratio (ref 0.8–1.0)
Prothrombin Time: 11.2 s (ref 9.6–13.1)

## 2021-08-27 ENCOUNTER — Ambulatory Visit: Payer: BC Managed Care – PPO | Admitting: Nurse Practitioner

## 2021-08-28 ENCOUNTER — Ambulatory Visit: Payer: BC Managed Care – PPO | Admitting: Internal Medicine

## 2021-08-28 ENCOUNTER — Encounter: Payer: Self-pay | Admitting: Internal Medicine

## 2021-08-28 VITALS — BP 126/64 | HR 71 | Ht 67.0 in | Wt 259.1 lb

## 2021-08-28 DIAGNOSIS — K766 Portal hypertension: Secondary | ICD-10-CM

## 2021-08-28 DIAGNOSIS — K7460 Unspecified cirrhosis of liver: Secondary | ICD-10-CM

## 2021-08-28 DIAGNOSIS — Z860100 Personal history of colon polyps, unspecified: Secondary | ICD-10-CM

## 2021-08-28 DIAGNOSIS — F1091 Alcohol use, unspecified, in remission: Secondary | ICD-10-CM | POA: Diagnosis not present

## 2021-08-28 DIAGNOSIS — K838 Other specified diseases of biliary tract: Secondary | ICD-10-CM

## 2021-08-28 DIAGNOSIS — K7581 Nonalcoholic steatohepatitis (NASH): Secondary | ICD-10-CM | POA: Diagnosis not present

## 2021-08-28 DIAGNOSIS — R932 Abnormal findings on diagnostic imaging of liver and biliary tract: Secondary | ICD-10-CM

## 2021-08-28 DIAGNOSIS — K8020 Calculus of gallbladder without cholecystitis without obstruction: Secondary | ICD-10-CM

## 2021-08-28 NOTE — Progress Notes (Signed)
Subjective:    Patient ID: Miranda Delacruz, female    DOB: 1960-07-15, 61 y.o.   MRN: [id number]  HPI Miranda Delacruz is a 61 year old female with a history of fatty liver disease cirrhosis, previous alcohol use, history of colonic polyps, obesity, type 2 diabetes, hypertension, hyperlipidemia, hypothyroidism, remote thyroid cancer who is here for follow-up.  She is here alone today.  I saw her recently for her variceal screening EGD.  EGD performed 07/02/2021 --no varices.  Mild inflammation in the gastric antrum without H. pylori by biopsy.  She reports that she is feeling well.  She has continued to work with primary care to control her diabetes.  She is trying to follow a more healthy diet focusing on healthy lean meats, fruits and vegetables.  She tried the keto diet for a while but that did not feel healthy to her.  She also previously tried [place] which initially caused decrease in appetite and mild weight loss but this did not sustain itself and this medication was stopped.  She denies abdominal pain.  No jaundice.  No lower extremity edema.  No bleeding.  She has avoided all alcohol since the cirrhosis diagnosis in January.  She previously drank about 5 drinks a day usually vodka.  Occasional wine. No family history of liver disease.  She works at an [employer] in [employer].   Review of Systems As per HPI, otherwise negative  Current Medications, Allergies, Past Medical History, Past Surgical History, Family History and Social History were reviewed in [employer] record.    Objective:   Physical Exam BP 126/64   Pulse 71   Ht 5' 7"  (1.702 m)   Wt 259 lb 2 oz (117.5 kg)   BMI 40.58 kg/m  Gen: awake, alert, NAD HEENT: anicteric, op clear CV: RRR, no mrg Pulm: CTA b/l Abd: soft, NT/ND, +BS throughout Ext: no c/c/e Neuro: nonfocal  Lab Results  Component Value Date   INR 1.0 08/20/2021   INR 1.1 (H) 04/08/2021      Latest Ref Rng &  Units 08/20/2021    3:25 PM 09/05/2020    9:58 AM 12/17/2017   10:08 AM  CBC  WBC 4.0 - 10.5 K/uL 7.8  5.9  4.1   Hemoglobin 12.0 - 15.0 g/dL 15.0  14.5  14.9   Hematocrit 36.0 - 46.0 % 43.9  43.4  42.9   Platelets 150.0 - 400.0 K/uL 237.0  241  201    CMP     Component Value Date/Time   NA 136 08/20/2021 1525   NA 137 02/21/2021 0911   K 4.2 08/20/2021 1525   CL 100 08/20/2021 1525   CO2 25 08/20/2021 1525   GLUCOSE 151 (H) 08/20/2021 1525   BUN 16 08/20/2021 1525   BUN 11 02/21/2021 0911   CREATININE 0.85 08/20/2021 1525   CREATININE 0.81 02/10/2016 1552   CALCIUM 10.3 08/20/2021 1525   PROT 7.2 08/20/2021 1525   PROT 6.3 02/21/2021 0911   ALBUMIN 4.9 08/20/2021 1525   ALBUMIN 4.7 02/21/2021 0911   AST 40 (H) 08/20/2021 1525   ALT 71 (H) 08/20/2021 1525   ALKPHOS 50 08/20/2021 1525   BILITOT 1.0 08/20/2021 1525   BILITOT 1.1 02/21/2021 0911   GFRNONAA 77 03/05/2020 1138   GFRAA 89 03/05/2020 1138   MELD 3.0: 8 at 08/20/2021  3:25 PM MELD-Na: 6 at 08/20/2021  3:25 PM Calculated from: Serum Creatinine: 0.85 mg/dL (Using min of 1 mg/dL) at 08/20/2021  3:25 PM Serum Sodium: 136 mEq/L at 08/20/2021  3:25 PM Total Bilirubin: 1.0 mg/dL at 08/20/2021  3:25 PM Serum Albumin: 4.9 g/dL (Using max of 3.5 g/dL) at 08/20/2021  3:25 PM INR(ratio): 1.0 ratio at 08/20/2021  3:25 PM Age at listing (hypothetical): 53 years Sex: Female at 08/20/2021  3:25 PM  ABDOMEN ULTRASOUND COMPLETE   COMPARISON:  12/18/2014   FINDINGS: Gallbladder: Cholelithiasis without evidence of acute cholecystitis. No sonographic Murphy sign noted by sonographer.   Common bile duct: Diameter: 9 mm, similar to ultrasound December 18, 2014   Liver: No focal lesion identified. Hepatomegaly. Coarsened hepatic echotexture with diffusely increased parenchymal echogenicity and areas of subtle contour nodularity. Portal vein is patent on color Doppler imaging with normal direction of blood flow towards the liver.    IVC: No abnormality visualized.   Pancreas: Visualized portion unremarkable.   Spleen: Splenomegaly measuring up to 17.7 cm with a volume of 1325 cc.   Right Kidney: Length: 13.6 cm. Echogenicity within normal limits. No mass or hydronephrosis visualized.   Left Kidney: Length: 15.7 cm. Echogenicity within normal limits. No mass or hydronephrosis visualized.   Abdominal aorta: No aneurysm visualized.   Other findings: None.   IMPRESSION: 1. Hepatic steatosis with hepatomegaly sonographic findings suggestive of cirrhosis. No focal liver lesion identified. 2. Cholelithiasis without sonographic evidence of acute cholecystitis. 3. Stable mild dilatation of the common bile duct measuring 9 mm, given stability the finding is likely within normal limits for this patient, consider further evaluation with laboratory values to exclude biliary obstruction. 4. Splenomegaly which in the setting of cirrhosis would be indicative of portal hypertension.     Electronically Signed   By: Miranda Delacruz M.D.   On: 03/11/2021 18:47          Assessment & Plan:  61 year old female with a history of fatty liver disease cirrhosis, previous alcohol use, history of colonic polyps, obesity, type 2 diabetes, hypertension, hyperlipidemia, hypothyroidism, remote thyroid cancer who is here for follow-up.  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  She does have mild splenomegaly but her platelets are normal.  Her INR is also normal.  Her liver enzymes are very mildly elevated.  We spent time today discussing the natural history of cirrhosis and how it is very important that she continue to avoid alcohol.  She seems motivated to do so.  Nonalcoholic fatty liver management discussed.  This involves controlling metabolic risk factors which for her is blood pressure, cholesterol and diabetes.  I also recommended a  10% body weight reduction in the next 1 year. --EGD negative for varices; repeat screening recommended May 2025 --[place] screening; MRI abdomen with and without contrast scheduled --Continue alcohol avoidance --Risk factor modification and 10% weight reduction in the next 1 year recommended --Currently disease is very well compensated and overall early in the process.  She has a very low MELD score.  We discussed that this can certainly progress and the goal is to remove liver inflammation which is the driving force behind fibrosis.  2.  Chronic biliary ductal dilatation/asymptomatic cholelithiasis --no evidence for gallbladder disease clinically though she does have gallstones.  MRCP at the time of MRI as above to exclude choledocholithiasis  3.  History of colon polyps -surveillance colonoscopy recommended in April 2025; we could likely perform EGD at the same time as discussed in #1  60-monthfollow-up with me

## 2021-08-28 NOTE — Patient Instructions (Addendum)
You have been scheduled for an MRI/MRCP at [facility] on 09/08/21. Your appointment time is 3:00 pm. Please arrive to admitting (at main entrance of the hospital) 30 minutes prior to your appointment time for registration purposes. Please make certain not to have anything to eat or drink 6 hours prior to your test. In addition, if you have any metal in your body, have a pacemaker or defibrillator, please be sure to let your ordering physician know. This test typically takes 45 minutes to 1 hour to complete. Should you need to reschedule, please call [phone] to do so.  Lets work on routine exercise and a weight loss of 10%. This will help the liver.  Please follow up with Dr Hilarie Fredrickson in 6 months in the office.   Continue to refrain from all alcohol.  If you are age 59 or older, your body mass index should be between 23-30. Your Body mass index is 40.58 kg/m. If this is out of the aforementioned range listed, please consider follow up with your Primary Care Provider.  If you are age 13 or younger, your body mass index should be between 19-25. Your Body mass index is 40.58 kg/m. If this is out of the aformentioned range listed, please consider follow up with your Primary Care Provider.   ________________________________________________________  The [place] providers would like to encourage you to use [facility] to communicate with providers for non-urgent requests or questions.  Due to long hold times on the telephone, sending your provider a message by [facility] may be a faster and more efficient way to get a response.  Please allow 48 business hours for a response.  Please remember that this is for non-urgent requests.  _______________________________________________________  Due to recent changes in healthcare laws, you may see the results of your imaging and laboratory studies on MyChart before your provider has had a chance to review them.  We understand that in some cases there may  be results that are confusing or concerning to you. Not all laboratory results come back in the same time frame and the provider may be waiting for multiple results in order to interpret others.  Please give [place] 48 hours in order for your provider to thoroughly review all the results before contacting the office for clarification of your results.

## 2021-09-03 ENCOUNTER — Encounter: Payer: Self-pay | Admitting: Internal Medicine

## 2021-09-04 NOTE — Telephone Encounter (Signed)
CT abdomen pelvis with contrast could substitute but given her biliary ductal dilatation and gallstones MRCP is certainly the preferred test Can we determine why her out-of-pocket cost is $2900? Does she have a high deductible plan? Is there another way to improve coverage on the scan?

## 2021-09-08 ENCOUNTER — Ambulatory Visit (HOSPITAL_COMMUNITY): Payer: BC Managed Care – PPO

## 2021-09-08 ENCOUNTER — Encounter: Payer: Self-pay | Admitting: Internal Medicine

## 2021-09-09 NOTE — Telephone Encounter (Signed)
Joey any help with this? Amy messaged you yesterday on this.

## 2021-09-10 NOTE — Telephone Encounter (Signed)
So will CT have the same cost to her?  She could compare the cost of the 2 scans and decide with knowledge MRI is preferred modality

## 2021-09-16 ENCOUNTER — Emergency Department (HOSPITAL_COMMUNITY)
Admission: EM | Admit: 2021-09-16 | Discharge: 2021-09-16 | Discharge status: Against Medical Advice | Payer: BC Managed Care – PPO | Attending: Emergency Medicine | Admitting: Emergency Medicine

## 2021-09-16 ENCOUNTER — Encounter (HOSPITAL_COMMUNITY): Payer: Self-pay

## 2021-09-16 ENCOUNTER — Other Ambulatory Visit: Payer: Self-pay

## 2021-09-16 DIAGNOSIS — Z5321 Procedure and treatment not carried out due to patient leaving prior to being seen by health care provider: Secondary | ICD-10-CM | POA: Insufficient documentation

## 2021-09-16 DIAGNOSIS — R1031 Right lower quadrant pain: Secondary | ICD-10-CM | POA: Insufficient documentation

## 2021-09-16 LAB — CBC
HCT: 44.3 % (ref 36.0–46.0)
Hemoglobin: 15.3 g/dL — ABNORMAL HIGH (ref 12.0–15.0)
MCH: 31.7 pg (ref 26.0–34.0)
MCHC: 34.5 g/dL (ref 30.0–36.0)
MCV: 91.7 fL (ref 80.0–100.0)
Platelets: 291 10*3/uL (ref 150–400)
RBC: 4.83 MIL/uL (ref 3.87–5.11)
RDW: 13 % (ref 11.5–15.5)
WBC: 8.5 10*3/uL (ref 4.0–10.5)
nRBC: 0 % (ref 0.0–0.2)

## 2021-09-16 LAB — BASIC METABOLIC PANEL
Anion gap: 11 (ref 5–15)
BUN: 17 mg/dL (ref 6–20)
CO2: 24 mmol/L (ref 22–32)
Calcium: 9.9 mg/dL (ref 8.9–10.3)
Chloride: 104 mmol/L (ref 98–111)
Creatinine, Ser: 0.78 mg/dL (ref 0.44–1.00)
GFR, Estimated: >60 mL/min (ref >60)
Glucose, Bld: 127 mg/dL — ABNORMAL HIGH (ref 70–99)
Potassium: 3.5 mmol/L (ref 3.5–5.1)
Sodium: 139 mmol/L (ref 135–145)

## 2021-09-16 LAB — I-STAT BETA HCG BLOOD, ED (MC, WL, AP ONLY): I-stat hCG, quantitative: <5 m[IU]/mL (ref <5)

## 2021-09-16 NOTE — ED Provider Triage Note (Signed)
Emergency Medicine Provider Triage Evaluation Note  Arelia Volpe , a 61 y.o. female  was evaluated in triage.  Pt complains of right sided abd pain, right flank pain x weeks. Today anterior pain which is new. Hx of known gallstones however feels different. No urinary symptoms. No hx of kidney stones  Review of Systems  Positive: Abd pain Negative:   Physical Exam  BP (!) 152/75   Pulse 72   Temp 98.2 F (36.8 C) (Oral)   Resp 18   Ht 5' 8"  (1.727 m)   Wt 115.7 kg   SpO2 96%   BMI 38.77 kg/m  Gen:   Awake, no distress   Resp:  Normal effort  MSK:   Moves extremities without difficulty  Other:    Medical Decision Making  Medically screening exam initiated at 6:05 PM.  Appropriate orders placed.  Rotha Cassels was informed that the remainder of the evaluation will be completed by another provider, this initial triage assessment does not replace that evaluation, and the importance of remaining in the ED until their evaluation is complete.  Abd pain   Ricka Westra A, PA-C 09/16/21 1806

## 2021-09-16 NOTE — ED Triage Notes (Signed)
Pt c/o pain and burning to her right flank for about 1 week. Denies n/v/d or any urinary symptoms.

## 2021-09-16 NOTE — ED Notes (Signed)
Waiting for IV for their CT

## 2021-09-17 ENCOUNTER — Ambulatory Visit (HOSPITAL_COMMUNITY): Admission: RE | Admit: 2021-09-17 | Payer: BC Managed Care – PPO | Source: Ambulatory Visit

## 2021-09-18 ENCOUNTER — Other Ambulatory Visit: Payer: Self-pay

## 2021-09-18 ENCOUNTER — Encounter (HOSPITAL_BASED_OUTPATIENT_CLINIC_OR_DEPARTMENT_OTHER): Payer: Self-pay | Admitting: Emergency Medicine

## 2021-09-18 ENCOUNTER — Ambulatory Visit
Admission: EM | Admit: 2021-09-18 | Discharge: 2021-09-18 | Payer: BC Managed Care – PPO | Attending: Physician Assistant | Admitting: Physician Assistant

## 2021-09-18 ENCOUNTER — Observation Stay (HOSPITAL_BASED_OUTPATIENT_CLINIC_OR_DEPARTMENT_OTHER)
Admission: EM | Admit: 2021-09-18 | Discharge: 2021-09-19 | Disposition: A | Discharge status: Home / Self Care | Payer: BC Managed Care – PPO | Attending: Emergency Medicine | Admitting: Emergency Medicine

## 2021-09-18 ENCOUNTER — Emergency Department (HOSPITAL_BASED_OUTPATIENT_CLINIC_OR_DEPARTMENT_OTHER): Payer: BC Managed Care – PPO

## 2021-09-18 DIAGNOSIS — Z87891 Personal history of nicotine dependence: Secondary | ICD-10-CM | POA: Insufficient documentation

## 2021-09-18 DIAGNOSIS — R1011 Right upper quadrant pain: Secondary | ICD-10-CM

## 2021-09-18 DIAGNOSIS — I10 Essential (primary) hypertension: Secondary | ICD-10-CM | POA: Insufficient documentation

## 2021-09-18 DIAGNOSIS — K810 Acute cholecystitis: Secondary | ICD-10-CM | POA: Diagnosis present

## 2021-09-18 DIAGNOSIS — Z79899 Other long term (current) drug therapy: Secondary | ICD-10-CM | POA: Insufficient documentation

## 2021-09-18 DIAGNOSIS — Z85850 Personal history of malignant neoplasm of thyroid: Secondary | ICD-10-CM | POA: Insufficient documentation

## 2021-09-18 DIAGNOSIS — E039 Hypothyroidism, unspecified: Secondary | ICD-10-CM | POA: Insufficient documentation

## 2021-09-18 DIAGNOSIS — R748 Abnormal levels of other serum enzymes: Secondary | ICD-10-CM | POA: Diagnosis not present

## 2021-09-18 DIAGNOSIS — K8000 Calculus of gallbladder with acute cholecystitis without obstruction: Principal | ICD-10-CM | POA: Insufficient documentation

## 2021-09-18 DIAGNOSIS — K8020 Calculus of gallbladder without cholecystitis without obstruction: Secondary | ICD-10-CM

## 2021-09-18 DIAGNOSIS — E119 Type 2 diabetes mellitus without complications: Secondary | ICD-10-CM | POA: Diagnosis not present

## 2021-09-18 DIAGNOSIS — E1169 Type 2 diabetes mellitus with other specified complication: Secondary | ICD-10-CM | POA: Diagnosis not present

## 2021-09-18 LAB — CBC WITH DIFFERENTIAL/PLATELET
Abs Immature Granulocytes: 0.02 10*3/uL (ref 0.00–0.07)
Basophils Absolute: 0 10*3/uL (ref 0.0–0.1)
Basophils Relative: 1 %
Eosinophils Absolute: 0.1 10*3/uL (ref 0.0–0.5)
Eosinophils Relative: 1 %
HCT: 40.3 % (ref 36.0–46.0)
Hemoglobin: 14.2 g/dL (ref 12.0–15.0)
Immature Granulocytes: 0 %
Lymphocytes Relative: 36 %
Lymphs Abs: 1.6 10*3/uL (ref 0.7–4.0)
MCH: 31.7 pg (ref 26.0–34.0)
MCHC: 35.2 g/dL (ref 30.0–36.0)
MCV: 90 fL (ref 80.0–100.0)
Monocytes Absolute: 0.4 10*3/uL (ref 0.1–1.0)
Monocytes Relative: 8 %
Neutro Abs: 2.5 10*3/uL (ref 1.7–7.7)
Neutrophils Relative %: 54 %
Platelets: 214 10*3/uL (ref 150–400)
RBC: 4.48 MIL/uL (ref 3.87–5.11)
RDW: 12.5 % (ref 11.5–15.5)
WBC: 4.6 10*3/uL (ref 4.0–10.5)
nRBC: 0 % (ref 0.0–0.2)

## 2021-09-18 LAB — COMPREHENSIVE METABOLIC PANEL
ALT: 49 U/L — ABNORMAL HIGH (ref 0–44)
AST: 27 U/L (ref 15–41)
Albumin: 4.8 g/dL (ref 3.5–5.0)
Alkaline Phosphatase: 46 U/L (ref 38–126)
Anion gap: 9 (ref 5–15)
BUN: 10 mg/dL (ref 6–20)
CO2: 28 mmol/L (ref 22–32)
Calcium: 10 mg/dL (ref 8.9–10.3)
Chloride: 102 mmol/L (ref 98–111)
Creatinine, Ser: 0.75 mg/dL (ref 0.44–1.00)
GFR, Estimated: >60 mL/min (ref >60)
Glucose, Bld: 164 mg/dL — ABNORMAL HIGH (ref 70–99)
Potassium: 4 mmol/L (ref 3.5–5.1)
Sodium: 139 mmol/L (ref 135–145)
Total Bilirubin: 1.1 mg/dL (ref 0.3–1.2)
Total Protein: 6.5 g/dL (ref 6.5–8.1)

## 2021-09-18 LAB — POCT URINALYSIS DIP (MANUAL ENTRY)
Bilirubin, UA: NEGATIVE
Blood, UA: NEGATIVE
Glucose, UA: NEGATIVE mg/dL
Ketones, POC UA: NEGATIVE mg/dL
Leukocytes, UA: NEGATIVE
Nitrite, UA: NEGATIVE
Protein Ur, POC: NEGATIVE mg/dL
Spec Grav, UA: 1.02 (ref 1.010–1.025)
Urobilinogen, UA: 0.2 E.U./dL
pH, UA: 7 (ref 5.0–8.0)

## 2021-09-18 LAB — URINALYSIS, ROUTINE W REFLEX MICROSCOPIC
Bilirubin Urine: NEGATIVE
Glucose, UA: NEGATIVE mg/dL
Hgb urine dipstick: NEGATIVE
Ketones, ur: NEGATIVE mg/dL
Leukocytes,Ua: NEGATIVE
Nitrite: NEGATIVE
Protein, ur: NEGATIVE mg/dL
Specific Gravity, Urine: 1.015 (ref 1.005–1.030)
pH: 7 (ref 5.0–8.0)

## 2021-09-18 LAB — SURGICAL PCR SCREEN
MRSA, PCR: NEGATIVE
Staphylococcus aureus: NEGATIVE

## 2021-09-18 LAB — LIPASE, BLOOD: Lipase: 51 U/L (ref 11–51)

## 2021-09-18 LAB — LACTIC ACID, PLASMA
Lactic Acid, Venous: 2.2 mmol/L (ref 0.5–1.9)
Lactic Acid, Venous: 1.5 mmol/L (ref 0.5–1.9)

## 2021-09-18 MED ORDER — ACETAMINOPHEN 325 MG PO TABS: 650.0000 mg | ORAL_TABLET | Freq: Four times a day (QID) | ORAL | Status: DC | PRN

## 2021-09-18 MED ORDER — ONDANSETRON 4 MG PO TBDP: 4.0000 mg | ORAL_TABLET | Freq: Four times a day (QID) | ORAL | Status: DC | PRN

## 2021-09-18 MED ORDER — METOPROLOL TARTRATE 5 MG/5ML IV SOLN: 5.0000 mg | Freq: Four times a day (QID) | INTRAVENOUS | Status: DC | PRN

## 2021-09-18 MED ORDER — POLYETHYLENE GLYCOL 3350 17 G PO PACK: 17.0000 g | PACK | Freq: Every day | ORAL | Status: DC | PRN

## 2021-09-18 MED ORDER — MORPHINE SULFATE (PF) 2 MG/ML IV SOLN: 2.0000 mg | INTRAVENOUS | Status: DC | PRN

## 2021-09-18 MED ORDER — OXYCODONE HCL 5 MG PO TABS: 5.0000 mg | ORAL_TABLET | ORAL | Status: DC | PRN

## 2021-09-18 MED ORDER — ONDANSETRON HCL 4 MG/2ML IJ SOLN: 4.0000 mg | Freq: Four times a day (QID) | INTRAMUSCULAR | Status: DC | PRN

## 2021-09-18 MED ORDER — SODIUM CHLORIDE 0.9 % IV SOLN
2.0000 g | INTRAVENOUS | Status: DC
  Administered 2021-09-18: 2 g via INTRAVENOUS
  Filled 2021-09-18: qty 20

## 2021-09-18 MED ORDER — MORPHINE SULFATE (PF) 4 MG/ML IV SOLN
4.0000 mg | Freq: Once | INTRAVENOUS | Status: DC
  Filled 2021-09-18: qty 1

## 2021-09-18 MED ORDER — ACETAMINOPHEN 650 MG RE SUPP: 650.0000 mg | Freq: Four times a day (QID) | RECTAL | Status: DC | PRN

## 2021-09-18 MED ORDER — ENOXAPARIN SODIUM 40 MG/0.4ML IJ SOSY
40.0000 mg | PREFILLED_SYRINGE | Freq: Every day | INTRAMUSCULAR | Status: DC
  Administered 2021-09-18: 40 mg via SUBCUTANEOUS
  Filled 2021-09-18: qty 0.4

## 2021-09-18 MED ORDER — METHOCARBAMOL 1000 MG/10ML IJ SOLN: 500.0000 mg | Freq: Three times a day (TID) | INTRAVENOUS | Status: DC | PRN

## 2021-09-18 MED ORDER — DIPHENHYDRAMINE HCL 25 MG PO CAPS: 25.0000 mg | ORAL_CAPSULE | Freq: Four times a day (QID) | ORAL | Status: DC | PRN

## 2021-09-18 MED ORDER — SIMETHICONE 80 MG PO CHEW: 40.0000 mg | CHEWABLE_TABLET | Freq: Four times a day (QID) | ORAL | Status: DC | PRN

## 2021-09-18 MED ORDER — METHOCARBAMOL 500 MG PO TABS: 500.0000 mg | ORAL_TABLET | Freq: Three times a day (TID) | ORAL | Status: DC | PRN

## 2021-09-18 MED ORDER — DIPHENHYDRAMINE HCL 50 MG/ML IJ SOLN: 25.0000 mg | Freq: Four times a day (QID) | INTRAMUSCULAR | Status: DC | PRN

## 2021-09-18 NOTE — H&P (Signed)
H&P Note  Miranda Delacruz [birth year]  [id number].    Requesting MD: Marda Stalker, MD Chief Complaint/Reason for Consult: Symptomatic cholelithiasis  HPI:  Patient is a 61 year old female who presented to [facility] with RUQ/R flank pain for the last week. Symptoms were intermittent and exacerbated by movement. Symptoms have become more persistent. Denies fever, chills, nausea, vomiting, diarrhea. Known hx of cholelithiasis. She is followed by Dr. Hilarie Fredrickson with [facility] for NASH cirrhosis and portal HTN. Recent EGD in May without esophageal varices and no signs of decompensation in labs currently. She abstains from EtOH. PMH otherwise significant for HTN, HLD, T2DM, and Hx of thyroid cancer s/p thyroidectomy. She is not on blood thinners. Prior abdominal surgery includes tubal ligation. NKDA. Denies tobacco or illicit drug use. 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 Husband at bedside.   ROS: Negative other than HPI  Family History  Problem Relation Age of Onset   Hypertension Mother    Hypertension Maternal Grandmother    Stroke Maternal Grandmother    Colon polyps Neg Hx    Colon cancer Neg Hx    Esophageal cancer Neg Hx    Rectal cancer Neg Hx    Stomach cancer Neg Hx     Past Medical History:  Diagnosis Date   Colon polyps    Diabetes mellitus without complication (HCC)    Gallstones    GERD (gastroesophageal reflux disease)    Hyperlipidemia    Hypertension    Hypothyroidism    Thyroid cancer ([place])    thyroid    Past Surgical History:  Procedure Laterality Date   COLONOSCOPY  2000   normal   COLONOSCOPY     COLONOSCOPY WITH PROPOFOL N/A 01/22/2015   Procedure: COLONOSCOPY WITH PROPOFOL;  Surgeon: Lucilla Lame, MD;  Location: [facility];  Service: Endoscopy;  Laterality: N/A;   POLYPECTOMY     THYROIDECTOMY  2009   TONSILLECTOMY AND ADENOIDECTOMY  1976   TUBAL LIGATION  1997    Social History:  reports that she quit smoking about 34 years ago. Her  smoking use included cigarettes. She has never used smokeless tobacco. She reports current alcohol use. She reports that she does not use drugs.  Allergies: No Known Allergies  (Not in a hospital admission)   Blood pressure 132/78, pulse 61, temperature 97.8 F (36.6 C), temperature source Oral, resp. rate 17, height 5' 8"  (1.727 m), weight 115.3 kg, SpO2 98 %. Physical Exam:  General: pleasant, WD, obese female who is laying in bed in NAD HEENT: head is normocephalic, atraumatic.  Sclera are anicteric.  Ears and nose without any masses or lesions.  Mouth is pink and moist Heart: regular, rate, and rhythm. Palpable pedal pulses bilaterally Lungs: Respiratory effort nonlabored on room air Abd: soft, TTP in RUQ, ND, +BS, no masses, hernias, or organomegaly MS: all 4 extremities are symmetrical with no cyanosis, clubbing, or edema. Skin: warm and dry with no masses, lesions, or rashes Neuro: Cranial nerves 2-12 grossly intact, sensation is normal throughout Psych: A&Ox3 with an appropriate affect.   Results for orders placed or performed during the hospital encounter of 09/18/21 (from the past 48 hour(s))  Urinalysis, Routine w reflex microscopic     Status: None   Collection Time: 09/18/21 10:30 AM  Result Value Ref Range   Color, Urine YELLOW YELLOW   APPearance CLEAR CLEAR   Specific Gravity, Urine 1.015 1.005 - 1.030   pH 7.0 5.0 - 8.0  Glucose, UA NEGATIVE NEGATIVE mg/dL   Hgb urine dipstick NEGATIVE NEGATIVE   Bilirubin Urine NEGATIVE NEGATIVE   Ketones, ur NEGATIVE NEGATIVE mg/dL   Protein, ur NEGATIVE NEGATIVE mg/dL   Nitrite NEGATIVE NEGATIVE   Leukocytes,Ua NEGATIVE NEGATIVE    Comment: Microscopic not done on urines with negative protein, blood, leukocytes, nitrite, or glucose < 500 mg/dL. Performed at [facility], [address] [place], [place] [address]   CBC with Differential     Status: None   Collection Time: 09/18/21 10:58 AM  Result Value Ref  Range   WBC 4.6 4.0 - 10.5 K/uL   RBC 4.48 3.87 - 5.11 MIL/uL   Hemoglobin 14.2 12.0 - 15.0 g/dL   HCT 40.3 36.0 - 46.0 %   MCV 90.0 80.0 - 100.0 fL   MCH 31.7 26.0 - 34.0 pg   MCHC 35.2 30.0 - 36.0 g/dL   RDW 12.5 11.5 - 15.5 %   Platelets 214 150 - 400 K/uL   nRBC 0.0 0.0 - 0.2 %   Neutrophils Relative % 54 %   Neutro Abs 2.5 1.7 - 7.7 K/uL   Lymphocytes Relative 36 %   Lymphs Abs 1.6 0.7 - 4.0 K/uL   Monocytes Relative 8 %   Monocytes Absolute 0.4 0.1 - 1.0 K/uL   Eosinophils Relative 1 %   Eosinophils Absolute 0.1 0.0 - 0.5 K/uL   Basophils Relative 1 %   Basophils Absolute 0.0 0.0 - 0.1 K/uL   Immature Granulocytes 0 %   Abs Immature Granulocytes 0.02 0.00 - 0.07 K/uL    Comment: Performed at [facility], [address] [place], [place] [address]  Comprehensive metabolic panel     Status: Abnormal   Collection Time: 09/18/21 10:58 AM  Result Value Ref Range   Sodium 139 135 - 145 mmol/L   Potassium 4.0 3.5 - 5.1 mmol/L   Chloride 102 98 - 111 mmol/L   CO2 28 22 - 32 mmol/L   Glucose, Bld 164 (H) 70 - 99 mg/dL    Comment: Glucose reference range applies only to samples taken after fasting for at least 8 hours.   BUN 10 6 - 20 mg/dL   Creatinine, Ser 0.75 0.44 - 1.00 mg/dL   Calcium 10.0 8.9 - 10.3 mg/dL   Total Protein 6.5 6.5 - 8.1 g/dL   Albumin 4.8 3.5 - 5.0 g/dL   AST 27 15 - 41 U/L   ALT 49 (H) 0 - 44 U/L   Alkaline Phosphatase 46 38 - 126 U/L   Total Bilirubin 1.1 0.3 - 1.2 mg/dL   GFR, Estimated >60 >60 mL/min    Comment: (NOTE) Calculated using the CKD-EPI Creatinine Equation (2021)    Anion gap 9 5 - 15    Comment: Performed at [facility], [address], [place], [place] [address]  Lactic acid, plasma     Status: Abnormal   Collection Time: 09/18/21 10:58 AM  Result Value Ref Range   Lactic Acid, Venous 2.2 (HH) 0.5 - 1.9 mmol/L    Comment: CRITICAL RESULT CALLED TO, READ BACK BY AND VERIFIED  WITH MARVA SIMMS, RN AT 1213 BY [facility] ON 7 27 23  Performed at [facility], [address] [place], [place] [address]   Lipase, blood     Status: None   Collection Time: 09/18/21 10:58 AM  Result Value Ref Range   Lipase 51  11 - 51 U/L    Comment: Performed at [facility], [address] [place], [place] [address]   US Abdomen Limited RUQ (LIVER/GB)  Result Date: 09/18/2021 CLINICAL DATA:  Pain right upper quadrant EXAM: ULTRASOUND ABDOMEN LIMITED RIGHT UPPER QUADRANT COMPARISON:  Abdomen sonogram done on 03/11/2021 FINDINGS: Gallbladder: There is a 2 cm calculus in the gallbladder. There is 5 mm echogenic focus without acoustic shadowing along the posterior wall of gallbladder, possibly a small polyp. There is no significant wall thickening. There is no fluid around the gallbladder. Technologist did not observe any tenderness over the gallbladder. Common bile duct: Diameter: 9.8 mm. There is no significant dilation of intrahepatic bile ducts. Distal common bile duct is not adequately visualized. Liver: There is increased echogenicity of liver suggesting fatty infiltration. No focal abnormalities are seen. Portal vein is patent on color Doppler imaging with normal direction of blood flow towards the liver. Other: None. IMPRESSION: There is 2 cm gallbladder stone, possibly calcified. There is possible 5 mm gallbladder polyp. There are no sonographic signs of acute cholecystitis. There is prominence of proximal common bile duct measuring 9.8 mm without significant dilation of intrahepatic bile ducts. Fatty liver. Electronically Signed   By: Elmer Picker M.D.   On: 09/18/2021 12:24      Assessment/Plan Cholelithiasis, probable acute cholecystitis  - RUQ [place] with 2 cm gallstone, possible 5 mm polyp, no sonographic signs of acute cholecystitis  - no leukocytosis and only mild elevation in ALT, LFTs otherwise normal - persistent pain that is reproducible on exam  -  recommend admission to observation and laparoscopic cholecystectomy tomorrow AM -  I have explained the procedure, risks, and aftercare of Laparoscopic cholecystectomy.  Risks include but are not limited to anesthesia (MI, CVA, death), bleeding, infection, wound problems, hernia, bile leak, injury to common bile duct/liver/intestine, increased risk of DVT/PE and diarrhea post op. She seems to understand and agrees to proceed.   FEN: CLD tonight, NPO after MN, IVF VTE: SCDs, LMWH ID: [email or web address]  NASH cirrhosis/portal HTN/previous EtOH use but in remission - followed by Dr. Hilarie Fredrickson at [place], recent EGD without esophageal varices, INR normal on 6/28, plts 214K, and no elevation in Tbili HTN HLD T2DM  GERD Hx of thyroid CA s/p thyroidectomy    I reviewed ED provider notes, last 24 h vitals and pain scores, last 48 h intake and output, last 24 h labs and trends, last 24 h imaging results, and previous GI outpatient notes/EGD .   Norm Parcel, [facility] 09/18/2021, 1:01 PM Please see Amion for pager number during day hours 7:00am-4:30pm

## 2021-09-18 NOTE — ED Triage Notes (Signed)
Pt presents with right side flank pain and RLQ abdominal pain for over a week.

## 2021-09-18 NOTE — ED Notes (Signed)
Patient is being discharged from the Urgent Care and sent to the Emergency Department via personal vehicle . Per Provider Ewell Poe, patient is in need of higher level of care due to possible gallbladder issue. Patient is aware and verbalizes understanding of plan of care.   Vitals:   09/18/21 0925  BP: (!) 143/72  Pulse: 64  Resp: 17  Temp: 98.7 F (37.1 C)  SpO2: 97%

## 2021-09-18 NOTE — ED Notes (Signed)
Patient refused morphine states that she did not want anything for pain

## 2021-09-18 NOTE — ED Notes (Signed)
Pain rt lower back and rt abdominal. Denies any n/v. Denies any issues with bowels

## 2021-09-18 NOTE — ED Provider Notes (Signed)
[facility] Provider Note   CSN: [id number] Arrival date & time: 09/18/21  1009     History  Chief Complaint  Patient presents with   Flank Pain   Abdominal Pain    Miranda Delacruz is a 61 y.o. female.   Abdominal Pain Pain location:  R flank and RUQ Pain quality: aching, cramping, dull and sharp   Pain radiates to:  R flank Pain severity:  Severe Onset quality:  Gradual Duration:  5 days Timing:  Intermittent Progression:  Waxing and waning Chronicity:  New Context: not recent illness and not trauma   Relieved by:  Nothing Worsened by:  Palpation Ineffective treatments:  NSAIDs Associated symptoms: no anorexia, no chest pain, no chills, no constipation, no cough, no diarrhea, no dysuria, no fatigue, no fever, no flatus, no hematuria, no nausea, no shortness of breath, no vaginal bleeding, no vaginal discharge and no vomiting   Risk factors: has not had multiple surgeries        Home Medications Prior to Admission medications   Medication Sig Start Date End Date Taking? Authorizing Provider  amLODipine (NORVASC) 5 MG tablet Take 1 tablet (5 mg total) by mouth daily. 08/18/21   Ronnell Freshwater, NP  benzonatate (TESSALON) 200 MG capsule Take 1 capsule (200 mg total) by mouth 2 (two) times daily as needed for cough. 05/11/21   [name] A, FNP  Cholecalciferol (VITAMIN D3) 125 MCG (5000 UT) CAPS  09/26/18   [provider]  Coenzyme Q10 200 MG capsule Take 1 capsule by mouth daily. 05/24/16   [provider]  diclofenac Sodium (VOLTAREN) 1 % GEL Apply 4 g topically 4 (four) times daily. 05/21/20   Ronnell Freshwater, NP  hydrochlorothiazide (MICROZIDE) 12.5 MG capsule Take 1 capsule (12.5 mg total) by mouth daily. 08/18/21   Ronnell Freshwater, NP  levothyroxine (EUTHYROX) 150 MCG tablet Take 1 tablet (150 mcg total) by mouth daily before breakfast. 07/22/21   Ronnell Freshwater, NP  magnesium oxide (MAG-OX) 400 MG tablet Take 2  tablets by mouth daily.    [provider]  Menaquinone-7 (VITAMIN K2) 100 MCG CAPS  09/26/18   [provider]  metFORMIN (GLUCOPHAGE) 500 MG tablet Take 1 tablet (500 mg total) by mouth 2 (two) times daily with a meal. 08/18/21   Ronnell Freshwater, NP  metoprolol succinate (TOPROL-XL) 100 MG 24 hr tablet Take 1 tablet (100 mg total) by mouth 2 (two) times daily. Take with or immediately following a meal. 08/18/21   Boscia, Greer Ee, NP  milk thistle 175 MG tablet Take 175 mg by mouth daily.    [provider]  sitaGLIPtin (JANUVIA) 25 MG tablet Take 1 tablet (25 mg total) by mouth daily. 08/18/21   Ronnell Freshwater, NP  valsartan (DIOVAN) 160 MG tablet Take 1 tablet (160 mg total) by mouth daily. 08/18/21   Ronnell Freshwater, NP  vitamin B-12 (CYANOCOBALAMIN) 1000 MCG tablet Take 1,000 mcg by mouth daily.    [provider]  zinc gluconate 50 MG tablet Take 1 tablet by mouth daily.    [provider]      Allergies    Patient has no known allergies.    Review of Systems   Review of Systems  Constitutional:  Negative for chills, fatigue and fever.  HENT:  Negative for congestion.   Respiratory:  Negative for cough, chest tightness, shortness of breath and wheezing.   Cardiovascular:  Negative for  chest pain, palpitations and leg swelling.  Gastrointestinal:  Positive for abdominal pain. Negative for anorexia, constipation, diarrhea, flatus, nausea and vomiting.  Genitourinary:  Positive for flank pain. Negative for dysuria, hematuria, vaginal bleeding and vaginal discharge.  Musculoskeletal:  Positive for back pain (R flank). Negative for neck pain and neck stiffness.  Skin:  Negative for rash and wound.  Neurological:  Negative for light-headedness and headaches.  Psychiatric/Behavioral:  Negative for agitation and confusion.   All other systems reviewed and are negative.   Physical Exam Updated Vital Signs BP 132/78 (BP Location: Left Arm)    Pulse 61   Temp 97.8 F (36.6 C) (Oral)   Resp 17   Ht 5' 8"  (1.727 m)   Wt 115.3 kg   SpO2 98%   BMI 38.65 kg/m  Physical Exam Vitals and nursing note reviewed.  Constitutional:      General: She is not in acute distress.    Appearance: She is well-developed. She is not ill-appearing, toxic-appearing or diaphoretic.  HENT:     Head: Normocephalic and atraumatic.     Mouth/Throat:     Mouth: Mucous membranes are moist.  Eyes:     Conjunctiva/sclera: Conjunctivae normal.  Cardiovascular:     Rate and Rhythm: Normal rate and regular rhythm.     Heart sounds: No murmur heard. Pulmonary:     Effort: Pulmonary effort is normal. No respiratory distress.     Breath sounds: Normal breath sounds.  Abdominal:     General: Abdomen is flat. Bowel sounds are normal. There is no distension.     Palpations: Abdomen is soft.     Tenderness: There is abdominal tenderness in the right upper quadrant and epigastric area. There is no right CVA tenderness, left CVA tenderness, guarding or rebound.    Musculoskeletal:        General: No swelling.     Cervical back: Neck supple.  Skin:    General: Skin is warm and dry.     Capillary Refill: Capillary refill takes less than 2 seconds.  Neurological:     Mental Status: She is alert.  Psychiatric:        Mood and Affect: Mood normal.     ED Results / Procedures / Treatments   Labs (all labs ordered are listed, but only abnormal results are displayed) Labs Reviewed  COMPREHENSIVE METABOLIC PANEL - Abnormal; Notable for the following components:      Result Value   Glucose, Bld 164 (*)    ALT 49 (*)    All other components within normal limits  LACTIC ACID, PLASMA - Abnormal; Notable for the following components:   Lactic Acid, Venous 2.2 (*)    All other components within normal limits  URINE CULTURE  CBC WITH DIFFERENTIAL/PLATELET  LIPASE, BLOOD  URINALYSIS, ROUTINE W REFLEX MICROSCOPIC  LACTIC ACID, PLASMA     EKG None  Radiology US Abdomen Limited RUQ (LIVER/GB)  Result Date: 09/18/2021 CLINICAL DATA:  Pain right upper quadrant EXAM: ULTRASOUND ABDOMEN LIMITED RIGHT UPPER QUADRANT COMPARISON:  Abdomen sonogram done on 03/11/2021 FINDINGS: Gallbladder: There is a 2 cm calculus in the gallbladder. There is 5 mm echogenic focus without acoustic shadowing along the posterior wall of gallbladder, possibly a small polyp. There is no significant wall thickening. There is no fluid around the gallbladder. Technologist did not observe any tenderness over the gallbladder. Common bile duct: Diameter: 9.8 mm. There is no significant dilation of intrahepatic bile ducts. Distal common bile duct is  not adequately visualized. Liver: There is increased echogenicity of liver suggesting fatty infiltration. No focal abnormalities are seen. Portal vein is patent on color Doppler imaging with normal direction of blood flow towards the liver. Other: None. IMPRESSION: There is 2 cm gallbladder stone, possibly calcified. There is possible 5 mm gallbladder polyp. There are no sonographic signs of acute cholecystitis. There is prominence of proximal common bile duct measuring 9.8 mm without significant dilation of intrahepatic bile ducts. Fatty liver. Electronically Signed   By: Elmer Picker M.D.   On: 09/18/2021 12:24    Procedures Procedures    Medications Ordered in ED Medications  morphine (PF) 4 MG/ML injection 4 mg (4 mg Intravenous Not Given 09/18/21 1054)    ED Course/ Medical Decision Making/ A&P                           Medical Decision Making Amount and/or Complexity of Data Reviewed Labs: ordered. Radiology: ordered.  Risk Prescription drug management.    Yeily Link is a 61 y.o. female with a past medical history significant for hypertension, hypothyroidism, previous papillary thyroid cancer, hyperlipidemia, diabetes, and previous cholelithiasis who presents with right abdominal pain.   According to patient, for the last 5 days or so she has had waxing waning aching and pain in her right flank wrapping around to her right upper quadrant.  She has never had pain like this before.  She was told she had a gallstone incidentally found on previous work-up but has never had any pain in the right upper quadrant epigastric area.  She reports that it started more in her right flank but is now in the right abdomen.  Is not associated with nausea, vomiting, constipation, or diarrhea.  She denies any history of kidney stones and is not having any pain in the CVA/other back areas.  She denies rashes to suggest shingles.  Denies any constipation, diarrhea, or urinary changes.  No hematuria reported.  No trauma.  No chest pain, shortness of breath, or palpitations.  It is not pleuritic.  She is simply having severe pain in her right upper quadrant that is new.  On exam, lungs clear and chest nontender.  Abdomen was tender in the right upper quadrant and right epigastric area.  There is also tenderness in the right flank towards the right back but there was not CVA tenderness or midline tenderness.  No rash to suggest shingles.  Rest of abdomen nontender.  Normal bowel sounds.  Rest of exam unremarkable.  Given her known gallstone in the location of discomfort I am somewhat concerned about cholecystitis versus symptomatic cholelithiasis.    We will start with labs and an ultrasound to look for acute cholecystitis.  If her ultrasound is negative, anticipate consideration for CT scan to rule out a right-sided diverticulitis or some other cause of her severe discomfort today.  We will also get urinalysis and give her some pain medicine.  Anticipate reassessment after work-up to determine disposition.  12:29 PM Work-up began to return.  Lactic acid is elevated at 2.2 but she has no leukocytosis.  Metabolic panel showed slight elevation in ALT but otherwise was reassuring.  Urinalysis did not show evidence  of infection or hematuria.  Right upper quadrant ultrasound was obtained and does show evidence of stones and a dilated common bile duct but there is not any definitive evidence of acute cholecystitis at this time.  Clinically I am most concerned about symptomatic  cholelithiasis versus early cholecystitis given the description of symptoms, ultrasound findings, and the lactic acidosis.  We will call general surgery to discuss disposition.  12:52 PM Just poke to general surgery.  They agree that this is either central cholelithiasis or early cholecystitis.  Due to the patient's continued pain, they request patient have an ED to ED transfer so they can evaluate themselves to determine if this is a emergent surgical issue.  1:41 PM Spoke to Dr. Ronnald Nian who accept the patient for ED to ED transfer to [facility] for general surgery evaluation.        Final Clinical Impression(s) / ED Diagnoses Final diagnoses:  Gallstones  RUQ abdominal pain    Clinical Impression: 1. Gallstones   2. RUQ abdominal pain     Disposition: Patient was transferred ED to ED for general surgery evaluation of the abdomen to determine if patient needs to go to the OR for gallbladder.  This note was prepared with assistance of Systems analyst. Occasional wrong-word or sound-a-like substitutions may have occurred due to the inherent limitations of voice recognition software.     Vi Biddinger, Gwenyth Allegra, MD 09/18/21 [phone]

## 2021-09-18 NOTE — Discharge Instructions (Signed)
  Please report to [facility]  [place], [place] [address]

## 2021-09-18 NOTE — ED Triage Notes (Signed)
Right flank pain radiating to right abdomen for one week.  No N/V/D.  No dysuria.  No known fever.

## 2021-09-18 NOTE — ED Notes (Addendum)
Patient left the facility at 1359. Patient is alert x4. Ezzard Flax states that she gave report to charge nurse Ed and That I  do not need to call.accetped by Dr Ronnald Nian

## 2021-09-18 NOTE — Discharge Instructions (Signed)
[facility]: POST OP INSTRUCTIONS Always review your discharge instruction sheet given to you by the facility where your surgery was performed. IF YOU HAVE DISABILITY OR FAMILY LEAVE FORMS, YOU MUST BRING THEM TO THE OFFICE FOR PROCESSING.   DO NOT GIVE THEM TO YOUR DOCTOR.  PAIN CONTROL  First take acetaminophen (Tylenol) AND/or ibuprofen (Advil) to control your pain after surgery.  Follow directions on package.  Taking acetaminophen (Tylenol) and/or ibuprofen (Advil) regularly after surgery will help to control your pain and lower the amount of prescription pain medication you may need.  You should not take more than 3,000 mg (3 grams) of acetaminophen (Tylenol) in 24 hours.  You should not take ibuprofen (Advil), aleve, motrin, naprosyn or other NSAIDS if you have a history of stomach ulcers or chronic kidney disease.  A prescription for pain medication may be given to you upon discharge.  Take your pain medication as prescribed, if you still have uncontrolled pain after taking acetaminophen (Tylenol) or ibuprofen (Advil). Use ice packs to help control pain. If you need a refill on your pain medication, please contact your pharmacy.  They will contact our office to request authorization. Prescriptions will not be filled after 5pm or on week-ends.  HOME MEDICATIONS Take your usually prescribed medications unless otherwise directed.  DIET You should follow a light diet the first few days after arrival home.  Be sure to include lots of fluids daily. Avoid fatty, fried foods.   CONSTIPATION It is common to experience some constipation after surgery and if you are taking pain medication.  Increasing fluid intake and taking a stool softener (such as Colace) will usually help or prevent this problem from occurring.  A mild laxative (Milk of Magnesia or Miralax) should be taken according to package instructions if there are no bowel movements after 48  hours.  WOUND/INCISION CARE Most patients will experience some swelling and bruising in the area of the incisions.  Ice packs will help.  Swelling and bruising can take several days to resolve.  Unless discharge instructions indicate otherwise, follow guidelines below  STERI-STRIPS - you may remove your outer bandages 48 hours after surgery, and you may shower at that time.  You have steri-strips (small skin tapes) in place directly over the incision.  These strips should be left on the skin for 7-10 days.   DERMABOND/SKIN GLUE - you may shower in 24 hours.  The glue will flake off over the next 2-3 weeks. Any sutures or staples will be removed at the office during your follow-up visit.  ACTIVITIES You may resume regular (light) daily activities beginning the next day--such as daily self-care, walking, climbing stairs--gradually increasing activities as tolerated.  You may have sexual intercourse when it is comfortable.  Refrain from any heavy lifting or straining until approved by your doctor. You may drive when you are no longer taking prescription pain medication, you can comfortably wear a seatbelt, and you can safely maneuver your car and apply brakes.  FOLLOW-UP You should see your doctor in the office for a follow-up appointment approximately 2-3 weeks after your surgery.  You should have been given your post-op/follow-up appointment when your surgery was scheduled.  If you did not receive a post-op/follow-up appointment, make sure that you call for this appointment within a day or two after you arrive home to insure a convenient appointment time.   WHEN TO CALL YOUR DOCTOR: Fever over 101.0 Inability to urinate Continued bleeding from incision.  Increased pain, redness, or drainage from the incision. Increasing abdominal pain  The clinic staff is available to answer your questions during regular business hours.  Please don't hesitate to call and ask to speak to one of the nurses for  clinical concerns.  If you have a medical emergency, go to the nearest emergency room or call 911.  A surgeon from [facility] is always on call at the hospital. [address] [place], [place], [place]  [address] ? [address], [place], [place]   [address] [phone] ? [phone] ? FAX [phone] Web site: [email or web address]

## 2021-09-18 NOTE — ED Provider Notes (Signed)
[name]    CSN: [id number] Arrival date & time: 09/18/21  0917      History   Chief Complaint Chief Complaint  Patient presents with   Flank Pain   Abdominal Pain    HPI Miranda Delacruz is a 61 y.o. female.   Patient here today for evaluation of right upper quadrant pain and right-sided flank pain that started over a week ago.  She reports that symptoms have waxed and waned.  She notes movement seems to make things worse.  She denies any nausea, vomiting or diarrhea.  She has not had fever.  She denies any known injury.  She does have history of gallstones.  The history is provided by the patient.    Past Medical History:  Diagnosis Date   Colon polyps    Diabetes mellitus without complication (Miranda Delacruz)    Gallstones    GERD (gastroesophageal reflux disease)    Hyperlipidemia    Hypertension    Hypothyroidism    Thyroid cancer (Miranda Delacruz)    thyroid    Patient Active Problem List   Diagnosis Date Noted   Calculus of gallbladder without cholecystitis without obstruction 03/18/2021   Elevated liver function tests 03/18/2021   Acute hip pain, left 01/29/2021   Muscle pain 01/29/2021   Acute pain of right knee 05/17/2020   Consumes a vegan diet- currently, for now 09/29/2018   Statin declined 05/26/2018   Type 2 diabetes mellitus with hypertriglyceridemia (Miranda Delacruz) 04/22/2018   Type 2 diabetes mellitus without complication, without long-term current use of insulin (Miranda Delacruz) 12/16/2017   Hypertension associated with diabetes (Miranda Delacruz) 12/16/2017   Mixed diabetic hyperlipidemia associated with type 2 diabetes mellitus (Miranda Delacruz) 12/16/2017   Microalbuminuria due to type 2 diabetes mellitus (Miranda Delacruz) 12/16/2017   Dysuria 02/08/2017   Abnormal urinalysis 02/08/2017   Counseling on health promotion and disease prevention 07/31/2016   Cervical cancer screening 07/31/2016   Screening for breast cancer 07/31/2016   Low serum HDL 10/24/2015   Menopause present 09/25/2015   Migraine  headache with aura 02/11/2015   Special screening for malignant neoplasms, colon    Benign neoplasm of ascending colon    Benign neoplasm of transverse colon    Benign neoplasm of sigmoid colon    Hepatic steatosis 12/18/2014   Elevated liver enzymes- from fatty liver disease. 12/10/2014   Hyperlipidemia 12/10/2014   Vitamin D deficiency 12/10/2014   Obesity, Class III, BMI 40-49.9 (morbid obesity) (Miranda Delacruz) 11/21/2014   Hypothyroidism 11/21/2014   HTN (hypertension) 11/21/2014   Hx of papillary thyroid cancer 11/21/2014    Past Surgical History:  Procedure Laterality Date   COLONOSCOPY  2000   normal   COLONOSCOPY     COLONOSCOPY WITH PROPOFOL N/A 01/22/2015   Procedure: COLONOSCOPY WITH PROPOFOL;  Surgeon: Lucilla Lame, MD;  Location: [facility];  Service: Endoscopy;  Laterality: N/A;   POLYPECTOMY     THYROIDECTOMY  2009   TONSILLECTOMY AND ADENOIDECTOMY  1976   TUBAL LIGATION  1997    OB History     Gravida  2   Para  2   Term  2   Preterm      AB      Living  2      SAB      IAB      Ectopic      Multiple      Live Births  2            Home Medications  Prior to Admission medications   Medication Sig Start Date End Date Taking? Authorizing Provider  amLODipine (NORVASC) 5 MG tablet Take 1 tablet (5 mg total) by mouth daily. 08/18/21   Ronnell Freshwater, NP  benzonatate (TESSALON) 200 MG capsule Take 1 capsule (200 mg total) by mouth 2 (two) times daily as needed for cough. 05/11/21   [name] A, FNP  Cholecalciferol (VITAMIN D3) 125 MCG (5000 UT) CAPS  09/26/18   [provider]  Coenzyme Q10 200 MG capsule Take 1 capsule by mouth daily. 05/24/16   [provider]  diclofenac Sodium (VOLTAREN) 1 % GEL Apply 4 g topically 4 (four) times daily. 05/21/20   Ronnell Freshwater, NP  hydrochlorothiazide (MICROZIDE) 12.5 MG capsule Take 1 capsule (12.5 mg total) by mouth daily. 08/18/21   Ronnell Freshwater, NP  levothyroxine  (EUTHYROX) 150 MCG tablet Take 1 tablet (150 mcg total) by mouth daily before breakfast. 07/22/21   Ronnell Freshwater, NP  magnesium oxide (MAG-OX) 400 MG tablet Take 2 tablets by mouth daily.    [provider]  Menaquinone-7 (VITAMIN K2) 100 MCG CAPS  09/26/18   [provider]  metFORMIN (GLUCOPHAGE) 500 MG tablet Take 1 tablet (500 mg total) by mouth 2 (two) times daily with a meal. 08/18/21   Ronnell Freshwater, NP  metoprolol succinate (TOPROL-XL) 100 MG 24 hr tablet Take 1 tablet (100 mg total) by mouth 2 (two) times daily. Take with or immediately following a meal. 08/18/21   Boscia, Greer Ee, NP  milk thistle 175 MG tablet Take 175 mg by mouth daily.    [provider]  sitaGLIPtin (JANUVIA) 25 MG tablet Take 1 tablet (25 mg total) by mouth daily. 08/18/21   Ronnell Freshwater, NP  valsartan (DIOVAN) 160 MG tablet Take 1 tablet (160 mg total) by mouth daily. 08/18/21   Ronnell Freshwater, NP  vitamin B-12 (CYANOCOBALAMIN) 1000 MCG tablet Take 1,000 mcg by mouth daily.    [provider]  zinc gluconate 50 MG tablet Take 1 tablet by mouth daily.    [provider]    Family History Family History  Problem Relation Age of Onset   Hypertension Mother    Hypertension Maternal Grandmother    Stroke Maternal Grandmother    Colon polyps Neg Hx    Colon cancer Neg Hx    Esophageal cancer Neg Hx    Rectal cancer Neg Hx    Stomach cancer Neg Hx     Social History Social History   Tobacco Use   Smoking status: Former    Types: Cigarettes    Quit date: 02/24/1987    Years since quitting: 34.5   Smokeless tobacco: Never  Vaping Use   Vaping Use: Never used  Substance Use Topics   Alcohol use: Yes    Comment: 5 per day   Drug use: No     Allergies   Patient has no known allergies.   Review of Systems Review of Systems  Constitutional:  Negative for chills and fever.  Respiratory:  Negative for shortness of breath.   Cardiovascular:   Negative for chest pain.  Gastrointestinal:  Positive for abdominal pain. Negative for diarrhea, nausea and vomiting.  Genitourinary:  Positive for flank pain.     Physical Exam Triage Vital Signs ED Triage Vitals  Enc Vitals Group     BP      Pulse      Resp  Temp      Temp src      SpO2      Weight      Height      Head Circumference      Peak Flow      Pain Score      Pain Loc      Pain Edu?      Excl. in [place]?    No data found.  Updated Vital Signs BP (!) 143/72 (BP Location: Left Arm)   Pulse 64   Temp 98.7 F (37.1 C) (Oral)   Resp 17   SpO2 97%      Physical Exam Vitals and nursing note reviewed.  Constitutional:      General: She is not in acute distress.    Appearance: Normal appearance. She is not ill-appearing.  HENT:     Head: Normocephalic and atraumatic.     Nose: Nose normal.  Cardiovascular:     Rate and Rhythm: Normal rate and regular rhythm.     Heart sounds: Normal heart sounds. No murmur heard. Pulmonary:     Effort: Pulmonary effort is normal. No respiratory distress.     Breath sounds: Normal breath sounds. No wheezing, rhonchi or rales.  Abdominal:     General: Abdomen is flat. Bowel sounds are normal. There is no distension.     Palpations: Abdomen is soft.     Tenderness: There is abdominal tenderness (RUQ TTP). There is no guarding.  Skin:    General: Skin is warm and dry.  Neurological:     Mental Status: She is alert.  Psychiatric:        Mood and Affect: Mood normal.        Thought Content: Thought content normal.      UC Treatments / Results  Labs (all labs ordered are listed, but only abnormal results are displayed) Labs Reviewed  POCT URINALYSIS DIP (MANUAL ENTRY)    EKG   Radiology No results found.  Procedures Procedures (including critical care time)  Medications Ordered in UC Medications - No data to display  Initial Impression / Assessment and Plan / UC Course  I have reviewed the triage vital  signs and the nursing notes.  Pertinent labs & imaging results that were available during my care of the patient were reviewed by me and considered in my medical decision making (see chart for details).   Discussed differential including muscle strain vs gallbladder etiology. Offered muscle relaxer and watchful waiting but patient prefers more definitive diagnosis today and will be seen in the ED for imaging, labs, etc.   Final Clinical Impressions(s) / UC Diagnoses   Final diagnoses:  RUQ pain     Discharge Instructions       Please report to [facility]  [place],  [id number]       ED Prescriptions   None    PDMP not reviewed this encounter.   Francene Finders, PA-C 09/18/21 [phone]

## 2021-09-18 NOTE — ED Provider Notes (Signed)
Patient is stable after transfer from the freestanding ED.  Vital signs are reassuring.  General surgery has seen the patient with plans to admit and managed with surgical intervention tomorrow.   Daleen Bo, MD 09/18/21 [phone]

## 2021-09-19 ENCOUNTER — Encounter (HOSPITAL_COMMUNITY): Payer: Self-pay

## 2021-09-19 ENCOUNTER — Encounter (HOSPITAL_COMMUNITY)
Admission: EM | Disposition: A | Discharge status: Home / Self Care | Payer: Self-pay | Source: Home / Self Care | Attending: Emergency Medicine

## 2021-09-19 ENCOUNTER — Observation Stay (HOSPITAL_COMMUNITY): Payer: BC Managed Care – PPO | Admitting: Certified Registered Nurse Anesthetist

## 2021-09-19 ENCOUNTER — Other Ambulatory Visit: Payer: Self-pay

## 2021-09-19 DIAGNOSIS — K8020 Calculus of gallbladder without cholecystitis without obstruction: Secondary | ICD-10-CM | POA: Diagnosis not present

## 2021-09-19 DIAGNOSIS — I10 Essential (primary) hypertension: Secondary | ICD-10-CM | POA: Diagnosis not present

## 2021-09-19 DIAGNOSIS — K8010 Calculus of gallbladder with chronic cholecystitis without obstruction: Secondary | ICD-10-CM | POA: Diagnosis not present

## 2021-09-19 DIAGNOSIS — K810 Acute cholecystitis: Secondary | ICD-10-CM | POA: Diagnosis not present

## 2021-09-19 DIAGNOSIS — Z87891 Personal history of nicotine dependence: Secondary | ICD-10-CM | POA: Diagnosis not present

## 2021-09-19 DIAGNOSIS — E119 Type 2 diabetes mellitus without complications: Secondary | ICD-10-CM | POA: Diagnosis not present

## 2021-09-19 HISTORY — PX: CHOLECYSTECTOMY: SHX55

## 2021-09-19 LAB — URINE CULTURE

## 2021-09-19 LAB — COMPREHENSIVE METABOLIC PANEL
ALT: 45 U/L — ABNORMAL HIGH (ref 0–44)
AST: 25 U/L (ref 15–41)
Albumin: 4 g/dL (ref 3.5–5.0)
Alkaline Phosphatase: 43 U/L (ref 38–126)
Anion gap: 8 (ref 5–15)
BUN: 13 mg/dL (ref 6–20)
CO2: 26 mmol/L (ref 22–32)
Calcium: 9 mg/dL (ref 8.9–10.3)
Chloride: 106 mmol/L (ref 98–111)
Creatinine, Ser: 0.73 mg/dL (ref 0.44–1.00)
GFR, Estimated: >60 mL/min (ref >60)
Glucose, Bld: 179 mg/dL — ABNORMAL HIGH (ref 70–99)
Potassium: 3.9 mmol/L (ref 3.5–5.1)
Sodium: 140 mmol/L (ref 135–145)
Total Bilirubin: 1 mg/dL (ref 0.3–1.2)
Total Protein: 6.1 g/dL — ABNORMAL LOW (ref 6.5–8.1)

## 2021-09-19 LAB — HIV ANTIBODY (ROUTINE TESTING W REFLEX): HIV Screen 4th Generation wRfx: NONREACTIVE

## 2021-09-19 LAB — CBC
HCT: 39.2 % (ref 36.0–46.0)
Hemoglobin: 14 g/dL (ref 12.0–15.0)
MCH: 32.9 pg (ref 26.0–34.0)
MCHC: 35.7 g/dL (ref 30.0–36.0)
MCV: 92 fL (ref 80.0–100.0)
Platelets: 196 10*3/uL (ref 150–400)
RBC: 4.26 MIL/uL (ref 3.87–5.11)
RDW: 12.7 % (ref 11.5–15.5)
WBC: 4.8 10*3/uL (ref 4.0–10.5)
nRBC: 0 % (ref 0.0–0.2)

## 2021-09-19 LAB — GLUCOSE, CAPILLARY
Glucose-Capillary: 191 mg/dL — ABNORMAL HIGH (ref 70–99)
Glucose-Capillary: 200 mg/dL — ABNORMAL HIGH (ref 70–99)

## 2021-09-19 LAB — PROTIME-INR
INR: 1 (ref 0.8–1.2)
Prothrombin Time: 12.6 seconds (ref 11.4–15.2)

## 2021-09-19 SURGERY — LAPAROSCOPIC CHOLECYSTECTOMY WITH INTRAOPERATIVE CHOLANGIOGRAM
Anesthesia: General

## 2021-09-19 MED ORDER — INDOCYANINE GREEN 25 MG IV SOLR
INTRAVENOUS | Status: DC | PRN
  Administered 2021-09-19: 2.5 mg via INTRAVENOUS

## 2021-09-19 MED ORDER — FENTANYL CITRATE (PF) 100 MCG/2ML IJ SOLN
INTRAMUSCULAR | Status: DC | PRN
  Administered 2021-09-19 (×2): 100 ug via INTRAVENOUS
  Administered 2021-09-19: 50 ug via INTRAVENOUS

## 2021-09-19 MED ORDER — LIDOCAINE 2% (20 MG/ML) 5 ML SYRINGE
INTRAMUSCULAR | Status: DC | PRN
  Administered 2021-09-19: 60 mg via INTRAVENOUS

## 2021-09-19 MED ORDER — BUPIVACAINE-EPINEPHRINE 0.25% -1:200000 IJ SOLN
INTRAMUSCULAR | Status: DC | PRN
  Administered 2021-09-19: 25 mL

## 2021-09-19 MED ORDER — AMLODIPINE BESYLATE 5 MG PO TABS: 5.0000 mg | ORAL_TABLET | Freq: Every day | ORAL | Status: DC

## 2021-09-19 MED ORDER — IRBESARTAN 150 MG PO TABS: 150.0000 mg | ORAL_TABLET | Freq: Every day | ORAL | Status: DC

## 2021-09-19 MED ORDER — DEXAMETHASONE SODIUM PHOSPHATE 10 MG/ML IJ SOLN
INTRAMUSCULAR | Status: DC | PRN
  Administered 2021-09-19: 8 mg via INTRAVENOUS

## 2021-09-19 MED ORDER — MORPHINE SULFATE (PF) 2 MG/ML IV SOLN: 2.0000 mg | INTRAVENOUS | Status: DC | PRN

## 2021-09-19 MED ORDER — HYDROCHLOROTHIAZIDE 12.5 MG PO TABS: 12.5000 mg | ORAL_TABLET | Freq: Every day | ORAL | Status: DC

## 2021-09-19 MED ORDER — OXYCODONE HCL 5 MG PO TABS: 5.0000 mg | ORAL_TABLET | ORAL | Status: DC | PRN

## 2021-09-19 MED ORDER — LACTATED RINGERS IV SOLN: INTRAVENOUS | Status: DC

## 2021-09-19 MED ORDER — OXYMETAZOLINE HCL 0.05 % NA SOLN: 1.0000 | Freq: Two times a day (BID) | NASAL | Status: DC | PRN

## 2021-09-19 MED ORDER — POLYETHYLENE GLYCOL 3350 17 G PO PACK: 17.0000 g | PACK | Freq: Every day | ORAL | 0 refills | Status: DC | PRN

## 2021-09-19 MED ORDER — OXYCODONE HCL 5 MG PO TABS
5.0000 mg | ORAL_TABLET | Freq: Four times a day (QID) | ORAL | 0 refills | Status: DC | PRN
Start: 2021-09-19 — End: 2021-11-18

## 2021-09-19 MED ORDER — LEVOTHYROXINE SODIUM 75 MCG PO TABS: 150.0000 ug | ORAL_TABLET | Freq: Every day | ORAL | Status: DC

## 2021-09-19 MED ORDER — FENTANYL CITRATE (PF) 100 MCG/2ML IJ SOLN
INTRAMUSCULAR | Status: AC
  Filled 2021-09-19: qty 2

## 2021-09-19 MED ORDER — ONDANSETRON HCL 4 MG/2ML IJ SOLN
INTRAMUSCULAR | Status: AC
  Filled 2021-09-19: qty 2

## 2021-09-19 MED ORDER — MIDAZOLAM HCL 5 MG/5ML IJ SOLN
INTRAMUSCULAR | Status: DC | PRN
  Administered 2021-09-19: 2 mg via INTRAVENOUS

## 2021-09-19 MED ORDER — PROPOFOL 10 MG/ML IV BOLUS
INTRAVENOUS | Status: AC
  Filled 2021-09-19: qty 20

## 2021-09-19 MED ORDER — LIDOCAINE HCL (PF) 2 % IJ SOLN
INTRAMUSCULAR | Status: AC
  Filled 2021-09-19: qty 5

## 2021-09-19 MED ORDER — ACETAMINOPHEN 325 MG PO TABS: 650.0000 mg | ORAL_TABLET | Freq: Four times a day (QID) | ORAL | Status: DC | PRN

## 2021-09-19 MED ORDER — LACTATED RINGERS IR SOLN
Status: DC | PRN
  Administered 2021-09-19: 1000 mL

## 2021-09-19 MED ORDER — FLUTICASONE PROPIONATE 50 MCG/ACT NA SUSP
2.0000 | Freq: Two times a day (BID) | NASAL | Status: DC
  Filled 2021-09-19: qty 16

## 2021-09-19 MED ORDER — ROCURONIUM BROMIDE 10 MG/ML (PF) SYRINGE
PREFILLED_SYRINGE | INTRAVENOUS | Status: DC | PRN
  Administered 2021-09-19: 80 mg via INTRAVENOUS
  Administered 2021-09-19: 20 mg via INTRAVENOUS

## 2021-09-19 MED ORDER — INSULIN ASPART 100 UNIT/ML IJ SOLN
INTRAMUSCULAR | Status: AC
  Filled 2021-09-19: qty 1

## 2021-09-19 MED ORDER — ROCURONIUM BROMIDE 10 MG/ML (PF) SYRINGE
PREFILLED_SYRINGE | INTRAVENOUS | Status: AC
  Filled 2021-09-19: qty 10

## 2021-09-19 MED ORDER — PROPOFOL 10 MG/ML IV BOLUS
INTRAVENOUS | Status: DC | PRN
  Administered 2021-09-19: 50 mg via INTRAVENOUS
  Administered 2021-09-19: 150 mg via INTRAVENOUS

## 2021-09-19 MED ORDER — CHLORHEXIDINE GLUCONATE 0.12 % MT SOLN
15.0000 mL | Freq: Once | OROMUCOSAL | Status: AC
  Administered 2021-09-19: 15 mL via OROMUCOSAL

## 2021-09-19 MED ORDER — METOPROLOL SUCCINATE ER 50 MG PO TB24: 100.0000 mg | ORAL_TABLET | Freq: Two times a day (BID) | ORAL | Status: DC

## 2021-09-19 MED ORDER — HYDROMORPHONE HCL 1 MG/ML IJ SOLN: 0.2500 mg | INTRAMUSCULAR | Status: DC | PRN

## 2021-09-19 MED ORDER — ONDANSETRON HCL 4 MG/2ML IJ SOLN
INTRAMUSCULAR | Status: DC | PRN
  Administered 2021-09-19: 4 mg via INTRAVENOUS

## 2021-09-19 MED ORDER — MIDAZOLAM HCL 2 MG/2ML IJ SOLN
INTRAMUSCULAR | Status: AC
  Filled 2021-09-19: qty 2

## 2021-09-19 MED ORDER — ACETAMINOPHEN 10 MG/ML IV SOLN
INTRAVENOUS | Status: DC | PRN
  Administered 2021-09-19: 1000 mg via INTRAVENOUS

## 2021-09-19 MED ORDER — 0.9 % SODIUM CHLORIDE (POUR BTL) OPTIME
TOPICAL | Status: DC | PRN
  Administered 2021-09-19: 1000 mL

## 2021-09-19 MED ORDER — BUPIVACAINE-EPINEPHRINE (PF) 0.25% -1:200000 IJ SOLN
INTRAMUSCULAR | Status: AC
  Filled 2021-09-19: qty 30

## 2021-09-19 MED ORDER — SUGAMMADEX SODIUM 200 MG/2ML IV SOLN
INTRAVENOUS | Status: DC | PRN
  Administered 2021-09-19: 400 mg via INTRAVENOUS

## 2021-09-19 MED ORDER — ACETAMINOPHEN 10 MG/ML IV SOLN
INTRAVENOUS | Status: AC
  Filled 2021-09-19: qty 100

## 2021-09-19 MED ORDER — INSULIN ASPART 100 UNIT/ML IJ SOLN
3.0000 [IU] | Freq: Once | INTRAMUSCULAR | Status: AC
  Administered 2021-09-19: 3 [IU] via SUBCUTANEOUS

## 2021-09-19 SURGICAL SUPPLY — 50 items
APPLICATOR ARISTA FLEXITIP XL (MISCELLANEOUS) IMPLANT
APPLIER CLIP 5 13 M/L LIGAMAX5 (MISCELLANEOUS) ×2
APPLIER CLIP ROT 10 11.4 M/L (STAPLE) ×2
BAG COUNTER SPONGE SURGICOUNT (BAG) IMPLANT
CABLE HIGH FREQUENCY MONO STRZ (ELECTRODE) ×2 IMPLANT
CHLORAPREP W/TINT 26 (MISCELLANEOUS) ×2 IMPLANT
CLIP APPLIE 5 13 M/L LIGAMAX5 (MISCELLANEOUS) ×1 IMPLANT
CLIP APPLIE ROT 10 11.4 M/L (STAPLE) IMPLANT
COVER MAYO STAND XLG (MISCELLANEOUS) ×2 IMPLANT
COVER SURGICAL LIGHT HANDLE (MISCELLANEOUS) ×2 IMPLANT
CUTTER FLEX LINEAR 45M (STAPLE) ×1 IMPLANT
DERMABOND ADVANCED (GAUZE/BANDAGES/DRESSINGS) ×1
DERMABOND ADVANCED .7 DNX12 (GAUZE/BANDAGES/DRESSINGS) ×1 IMPLANT
DISSECTOR BLUNT TIP ENDO 5MM (MISCELLANEOUS) IMPLANT
DRAPE C-ARM 42X120 X-RAY (DRAPES) ×1 IMPLANT
ELECT PENCIL ROCKER SW 15FT (MISCELLANEOUS) ×2 IMPLANT
ELECT REM PT RETURN 15FT ADLT (MISCELLANEOUS) ×2 IMPLANT
GLOVE BIO SURGEON STRL SZ7.5 (GLOVE) ×2 IMPLANT
GLOVE INDICATOR 8.0 STRL GRN (GLOVE) ×2 IMPLANT
GOWN STRL REUS W/ TWL XL LVL3 (GOWN DISPOSABLE) ×2 IMPLANT
GOWN STRL REUS W/TWL XL LVL3 (GOWN DISPOSABLE) ×2
GRASPER SUT TROCAR 14GX15 (MISCELLANEOUS) IMPLANT
HEMOSTAT ARISTA ABSORB 3G PWDR (HEMOSTASIS) IMPLANT
HEMOSTAT SNOW SURGICEL 2X4 (HEMOSTASIS) IMPLANT
IRRIG SUCT STRYKERFLOW 2 WTIP (MISCELLANEOUS) ×2
IRRIGATION SUCT STRKRFLW 2 WTP (MISCELLANEOUS) ×1 IMPLANT
KIT BASIN OR (CUSTOM PROCEDURE TRAY) ×2 IMPLANT
KIT TURNOVER KIT A (KITS) IMPLANT
NDL INSUFFLATION 14GA 120MM (NEEDLE) IMPLANT
NEEDLE INSUFFLATION 14GA 120MM (NEEDLE) IMPLANT
RELOAD STAPLE 45 3.5 BLU ETS (ENDOMECHANICALS) IMPLANT
RELOAD STAPLE TA45 3.5 REG BLU (ENDOMECHANICALS) ×2 IMPLANT
SCISSORS LAP 5X35 DISP (ENDOMECHANICALS) ×2 IMPLANT
SET CHOLANGIOGRAPH MIX (MISCELLANEOUS) IMPLANT
SET TUBE SMOKE EVAC HIGH FLOW (TUBING) ×2 IMPLANT
SLEEVE ADV FIXATION 5X100MM (TROCAR) ×4 IMPLANT
SPIKE FLUID TRANSFER (MISCELLANEOUS) ×2 IMPLANT
SUT MNCRL AB 4-0 PS2 18 (SUTURE) ×2 IMPLANT
SUT VIC AB 2-0 CT1 27 (SUTURE) ×1
SUT VIC AB 2-0 CT1 TAPERPNT 27 (SUTURE) IMPLANT
SYR 10ML ECCENTRIC (SYRINGE) ×2 IMPLANT
SYS BAG RETRIEVAL 10MM (BASKET) ×4
SYSTEM BAG RETRIEVAL 10MM (BASKET) ×1 IMPLANT
TOWEL OR 17X26 10 PK STRL BLUE (TOWEL DISPOSABLE) ×2 IMPLANT
TOWEL OR NON WOVEN STRL DISP B (DISPOSABLE) IMPLANT
TRAY LAPAROSCOPIC (CUSTOM PROCEDURE TRAY) ×2 IMPLANT
TROCAR 11X100 Z THREAD (TROCAR) IMPLANT
TROCAR ADV FIXATION 12X100MM (TROCAR) IMPLANT
TROCAR ADV FIXATION 5X100MM (TROCAR) ×2 IMPLANT
TROCAR BALLN 12MMX100 BLUNT (TROCAR) ×2 IMPLANT

## 2021-09-19 NOTE — Progress Notes (Signed)
Patient was updated as to why she hasn't went back for surgery.  Surgery originally scheduled for 0940. Informed patient her MD has been delayed in another case and her surgery has been tentatively moved to 1315.  Patient voiced understanding, calm but visibly frustrated.  Offered to allow patient to get up and move around or use the restroom, patient declined.  Patient states she just spoke with her husband via room phone and she will call him back now to update him.  Offered to allow patients husband to come back and sit with her and she states she will call and tell him that.  Informed patient he is welcome in short stay until she goes back to surgery. Patient voiced understanding.

## 2021-09-19 NOTE — Anesthesia Preprocedure Evaluation (Signed)
Anesthesia Evaluation  Patient identified by MRN, date of birth, ID band Patient awake    Reviewed: Allergy & Precautions, NPO status , Patient's Chart, lab work & pertinent test results  Airway Mallampati: II  TM Distance: >3 FB     Dental   Pulmonary former smoker,    breath sounds clear to auscultation       Cardiovascular hypertension,  Rhythm:Regular Rate:Normal     Neuro/Psych    GI/Hepatic GERD  ,  Endo/Other  diabetesHypothyroidism   Renal/GU Renal disease     Musculoskeletal   Abdominal   Peds  Hematology   Anesthesia Other Findings   Reproductive/Obstetrics                             Anesthesia Physical Anesthesia Plan  ASA: 3  Anesthesia Plan: General   Post-op Pain Management:    Induction: Intravenous  PONV Risk Score and Plan: Ondansetron, Dexamethasone and Midazolam  Airway Management Planned: Oral ETT  Additional Equipment:   Intra-op Plan:   Post-operative Plan: Extubation in OR  Informed Consent: I have reviewed the patients History and Physical, chart, labs and discussed the procedure including the risks, benefits and alternatives for the proposed anesthesia with the patient or authorized representative who has indicated his/her understanding and acceptance.     Dental advisory given  Plan Discussed with: CRNA and Anesthesiologist  Anesthesia Plan Comments:         Anesthesia Quick Evaluation

## 2021-09-19 NOTE — Progress Notes (Signed)
Subjective No acute events. Feeling reasonably well. No n/v. States she is ready for surgery  Objective: Vital signs in last 24 hours: Temp:  [97.9 F (36.6 C)-98.5 F (36.9 C)] 97.9 F (36.6 C) (07/28 0818) Pulse Rate:  [54-68] 68 (07/28 0818) Resp:  [15-18] 15 (07/28 0818) BP: (110-174)/(59-103) 174/88 (07/28 0818) SpO2:  [95 %-100 %] 100 % (07/28 0818) Weight:  [115.3 kg] 115.3 kg (07/28 0839) Last BM Date : 09/19/21  Intake/Output from previous day: 07/27 0701 - 07/28 0700 In: 820.1 [P.O.:720; IV Piggyback:100.1] Out: -  Intake/Output this shift: No intake/output data recorded.  Gen: NAD, comfortable CV: RRR Pulm: Normal work of breathing Abd: Soft, mild RUQ tenderness, nondistended Ext: SCDs in place  Lab Results: CBC  Recent Labs    09/18/21 1058 09/19/21 0446  WBC 4.6 4.8  HGB 14.2 14.0  HCT 40.3 39.2  PLT 214 196   BMET Recent Labs    09/18/21 1058 09/19/21 0446  NA 139 140  K 4.0 3.9  CL 102 106  CO2 28 26  GLUCOSE 164* 179*  BUN 10 13  CREATININE 0.75 0.73  CALCIUM 10.0 9.0   PT/INR Recent Labs    09/19/21 0446  LABPROT 12.6  INR 1.0   ABG No results for input(s): "PHART", "HCO3" in the last 72 hours.  Invalid input(s): "PCO2", "PO2"  Studies/Results:  Anti-infectives: Anti-infectives (From admission, onward)    Start     Dose/Rate Route Frequency Ordered Stop   09/18/21 1630  [MAR Hold]  cefTRIAXone (ROCEPHIN) 2 g in sodium chloride 0.9 % 100 mL IVPB        (MAR Hold since Fri 09/19/2021 at 0819.Hold Reason: Transfer to a Procedural area)   2 g 200 mL/hr over 30 Minutes Intravenous Every 24 hours 09/18/21 1543 09/25/21 1629        Assessment/Plan: Patient Active Problem List   Diagnosis Date Noted   Acute cholecystitis 09/18/2021   Calculus of gallbladder without cholecystitis without obstruction 03/18/2021   Elevated liver function tests 03/18/2021   Acute hip pain, left 01/29/2021   Muscle pain 01/29/2021   Acute  pain of right knee 05/17/2020   Consumes a vegan diet- currently, for now 09/29/2018   Statin declined 05/26/2018   Type 2 diabetes mellitus with hypertriglyceridemia ([place]) 04/22/2018   Type 2 diabetes mellitus without complication, without long-term current use of insulin ([place]) 12/16/2017   Hypertension associated with diabetes ([place]) 12/16/2017   Mixed diabetic hyperlipidemia associated with type 2 diabetes mellitus ([place]) 12/16/2017   Microalbuminuria due to type 2 diabetes mellitus ([place]) 12/16/2017   Dysuria 02/08/2017   Abnormal urinalysis 02/08/2017   Counseling on health promotion and disease prevention 07/31/2016   Cervical cancer screening 07/31/2016   Screening for breast cancer 07/31/2016   Low serum HDL 10/24/2015   Menopause present 09/25/2015   Migraine headache with aura 02/11/2015   Special screening for malignant neoplasms, colon    Benign neoplasm of ascending colon    Benign neoplasm of transverse colon    Benign neoplasm of sigmoid colon    Hepatic steatosis 12/18/2014   Elevated liver enzymes- from fatty liver disease. 12/10/2014   Hyperlipidemia 12/10/2014   Vitamin D deficiency 12/10/2014   Obesity, Class III, BMI 40-49.9 (morbid obesity) ([place]) 11/21/2014   Hypothyroidism 11/21/2014   HTN (hypertension) 11/21/2014   Hx of papillary thyroid cancer 11/21/2014   s/p Procedure(s): LAPAROSCOPIC CHOLECYSTECTOMY WITH INTRAOPERATIVE CHOLANGIOGRAM 09/19/2021  -The anatomy and physiology of the hepatobiliary system was  discussed at length with the patient with associated pictures. The pathophysiology of gallbladder disease was discussed at length with associated pictures. -The options for treatment were discussed including ongoing observation vs surgery - laparoscopic cholecystectomy with indocyanine green cholangiography  -She would like to pursue surgery -The planned procedure, material risks (including, but not limited to, pain, bleeding, infection, scarring, need for  blood transfusion, damage to surrounding structures- blood vessels/nerves/viscus/organs, damage to bile duct, bile leak, need for additional procedures, hernia, worsening of pre-existing medical conditions, pancreatitis, pneumonia, heart attack, stroke, death) benefits and alternatives to surgery were discussed at length. The patient's questions were answered to her satisfaction, she voiced understanding and elected to proceed with surgery. Additionally, we discussed typical postoperative expectations and the recovery process.   LOS: 0 days   I spent a total of 35 minutes in both face-to-face and non-face-to-face activities, excluding procedures performed, for this visit on the date of this encounter.  Nadeen Landau, [facility], [place]

## 2021-09-19 NOTE — Progress Notes (Signed)
Patient was given discharge instructions, and all questions were answered. Patient was stable for discharge and was taken to the main exit by wheelchair.

## 2021-09-19 NOTE — Anesthesia Procedure Notes (Signed)
Procedure Name: Intubation Date/Time: 09/19/2021 1:49 PM  Performed by: Victoriano Lain, CRNAPre-anesthesia Checklist: Patient identified, Emergency Drugs available, Suction available, Patient being monitored and Timeout performed Patient Re-evaluated:Patient Re-evaluated prior to induction Oxygen Delivery Method: Circle system utilized Preoxygenation: Pre-oxygenation with 100% oxygen Induction Type: IV induction Ventilation: Mask ventilation without difficulty Laryngoscope Size: Mac and 4 Grade View: Grade I Tube type: Oral Tube size: 7.5 mm Number of attempts: 1 Airway Equipment and Method: Stylet Placement Confirmation: ETT inserted through vocal cords under direct vision, positive ETCO2 and breath sounds checked- equal and bilateral Secured at: 22 cm Tube secured with: Tape Dental Injury: Teeth and Oropharynx as per pre-operative assessment

## 2021-09-19 NOTE — Transfer of Care (Signed)
Immediate Anesthesia Transfer of Care Note  Patient: [name]  Procedure(s) Performed: LAPAROSCOPIC CHOLECYSTECTOMY WITH ICG  Patient Location: PACU  Anesthesia Type:General  Level of Consciousness: awake, alert , oriented and patient cooperative  Airway & Oxygen Therapy: Patient Spontanous Breathing and Patient connected to face mask oxygen  Post-op Assessment: Report given to RN, Post -op Vital signs reviewed and stable and Patient moving all extremities X 4  Post vital signs: Reviewed and stable  Last Vitals:  Vitals Value Taken Time  BP 159/82 09/19/21 1545  Temp    Pulse 73 09/19/21 1548  Resp 19 09/19/21 1547  SpO2 100 % 09/19/21 1548  Vitals shown include unvalidated device data.  Last Pain:  Vitals:   09/19/21 0838  TempSrc:   PainSc: 5       Patients Stated Pain Goal: 4 (04/59/97 7414)  Complications: No notable events documented.

## 2021-09-19 NOTE — Op Note (Signed)
09/19/2021 3:33 PM  PATIENT: Miranda Delacruz  61 y.o. female  Patient Care Team: Ronnell Freshwater, NP as PCP - General (Family Medicine) Copland, Ginette Otto as Referring Physician (Obstetrics and Gynecology) Lonia Farber, MD as Consulting Physician (Endocrinology)  PRE-OPERATIVE DIAGNOSIS: Acute cholecystitis  POST-OPERATIVE DIAGNOSIS: Same  PROCEDURE: Laparoscopic cholecystectomy with ICG cholangiography  SURGEON: Sharon Mt. Asah Lamay, MD  ASSISTANT: Margie Billet, PA-C  ANESTHESIA: General endotracheal  EBL: Total I/O In: 1600 [I.V.:1500; IV Piggyback:100] Out: 20 [Blood:20]  DRAINS: None  SPECIMEN: Gallbladder  COUNTS: Sponge, needle and instrument counts were reported correct x2 at the conclusion of the operation  DISPOSITION: PACU in satisfactory condition  COMPLICATIONS: None  FINDINGS: Significant fatty liver disease with a substantially enlarged liver that extends below her costal margins and has costal indentations on the right anterior surface.  Gallbladder was completely covered by the liver as well as overlying omentum.  Ultimately, were able to tease away this omentum and visualize her subhepatic gallbladder.  Mild amount of inflammation around the gallbladder with adhesed omentum.  ICG cholangiography demonstrates uptake in liver with faint excretion apparent in cystic duct. Gallbladder did not clearly opacify with ICG. Faint tracer seen through wall of duodenum suggestive of patent biliary sytem. Critical view of safety was achieved prior to clipping or dividing any structures.  DESCRIPTION:  The patient was identified & brought into the operating room. She was then positioned supine on the OR table. SCDs were in place and active during the entire case. She then underwent general endotracheal anesthesia.2.5 mg of ICG had been administered. Pressure points were padded. Hair on the abdomen was clipped by the OR team. The abdomen was prepped and draped in  the standard sterile fashion. Antibiotics were administered. A surgical timeout was performed and confirmed our plan.   A supraumbilical incision was made. The umbilical stalk was grasped and retracted outwardly. The supraumbilical fascia was identified and incised. The peritoneal cavity was gently entered bluntly. A purse-string 0 Vicryl suture was placed. The Hasson cannula was inserted into the peritoneal cavity and insufflation with CO2 commenced to 17mHg. A laparoscope was inserted into the peritoneal cavity and inspection confirmed no evidence of trocar site complications. The patient was then positioned in reverse Trendelenburg with slight left side down. 3 additional 5104mtrocars were placed along the right subcostal line - one 53m11mort in mid subcostal region, another 53mm80mrt in the right flank near the anterior axillary line, and a third 53mm 41mt in the left subxiphoid region obliquely near the falciform ligament.  The liver was inspected and significantly enlarged.  Fatty changes with blunted edges.  There is also costal indentations on the right anterior lobe with liver extending below the costal margin.  All of this is consistent with fatty liver disease.  Gallbladder is initially not visualized as it is encapsulated in omentum covered then with enlarged liver.  This omentum is able to be dissected away bluntly and the fundus of the gallbladder is found.  The gallbladder fundus was grasped and elevated cephalad. An additional grasper was then placed on the infundibulum of the gallbladder and the infundibulum was retracted laterally. Staying high on the gallbladder, the peritoneum on both sides of the gallbladder was opened with hook cautery. Gentle blunt dissection was then employed with a MarylIT consultanting down into [place] cystic duct was identified and carefully circumferentially dissected. The cystic artery was also identified and carefully circumferentially dissected.  The space between the cystic  artery and hepatocystic plate was developed such that a good view of the liver could be seen through a window medial to the cystic artery. The triangle of [place] had been cleared of all fibrofatty tissue. At this point, a critical view of safety was achieved and the only structures visualized was the skeletonized cystic duct laterally, the skeletonized cystic artery and the liver through the window medial to the artery. No posterior cystic artery was noted  Under near infrared light, ICG tracer is visualized within the liver parenchyma. The cystic duct has faint tracer within. The cystic artery is without tracer. There is faint tracer seen through anterior wall of duodenum suggesting patent biliary tree.  The cystic artery was clipped with 2 on the patient side and 1 on the specimen side.  This is then divided.  Due to her habitus and difficulty with manipulating the gallbladder, not able to get a clip applier encircling the cystic duct and therefore opted to divide this at the level of the infundibulum using a laparoscopic 45 mm blue load GIA stapler.  The staple line is inspected noted to be well formed and intact.  The cystic artery stump is hemostatic.  The gallbladder was then freed from its remaining attachments to the liver using electrocautery and placed into an endocatch bag.  There was some spillage of bile during extraction.  The RUQ was copiously irrigated with sterile saline until the effluent ran clear. Hemostasis was then verified. The clips were in good position; the gallbladder fossa was dry. The rest of the abdomen was inspected no injury nor bleeding elsewhere was identified.  The endocatch bag containing the gallbladder was then removed from the umbilical port site and passed off as specimen. The RUQ ports were removed under direct visualization and noted to be hemostatic. The umbilical fascia was then closed using the 0 Vicryl purse-string suture. The fascia  was palpated and noted to be completely closed. The skin of all incision sites was approximated with 4-0 monocryl subcuticular suture and dermabond applied. She was then awakened from anesthesia, extubated, and transferred to a stretcher for transport to PACU in satisfactory condition.

## 2021-09-19 NOTE — Anesthesia Postprocedure Evaluation (Signed)
Anesthesia Post Note  Patient: Miranda Delacruz  Procedure(s) Performed: LAPAROSCOPIC CHOLECYSTECTOMY WITH ICG     Patient location during evaluation: PACU Anesthesia Type: General Level of consciousness: awake and alert, oriented and patient cooperative Pain management: pain level controlled Vital Signs Assessment: post-procedure vital signs reviewed and stable Respiratory status: spontaneous breathing, nonlabored ventilation and respiratory function stable Cardiovascular status: blood pressure returned to baseline and stable Postop Assessment: no apparent nausea or vomiting Anesthetic complications: no   No notable events documented.  Last Vitals:  Vitals:   09/19/21 1543 09/19/21 1545  BP: (!) 164/96 (!) 159/82  Pulse: 82 81  Resp: (!) 25 (!) 25  Temp: 37.3 C   SpO2: 100% 100%    Last Pain:  Vitals:   09/19/21 1543  TempSrc:   PainSc: 0-No pain                 [name]

## 2021-09-20 ENCOUNTER — Encounter (HOSPITAL_COMMUNITY): Payer: Self-pay | Admitting: Surgery

## 2021-09-22 NOTE — Discharge Summary (Signed)
[facility] Discharge Summary   Patient ID: Miranda Delacruz MRN: [id number] DOB/AGE: 1961/02/20 61 y.o.  Admit date: 09/18/2021 Discharge date: 09/22/2021   Discharge Diagnosis Acute cholecystitis  Consultants None  Imaging: No results found.  Procedures Dr. Dema Severin (09/19/2021) - Laparoscopic Cholecystectomy with [place] Hospital Course:  Miranda Delacruz is a 62 y.o. female PMH NASH cirrhosis who presented to [facility] 7/27 with RUQ pain.  Workup showed symptomatic cholelithiasis with concern for early acute cholecystitis.  Patient was admitted and underwent procedure listed above.  Tolerated procedure well and was transferred to the floor.  Diet was advanced as tolerated.  On POD0, the patient was voiding well, tolerating diet, ambulating well, pain well controlled, vital signs stable, incisions c/d/i and felt stable for discharge home.  Patient will follow up as below and knows to call with questions or concerns.      Allergies as of 09/19/2021   No Known Allergies      Medication List     TAKE these medications    acetaminophen 325 MG tablet Commonly known as: TYLENOL Take 2 tablets (650 mg total) by mouth every 6 (six) hours as needed for mild pain (or temp > 100).   amLODipine 5 MG tablet Commonly known as: NORVASC Take 1 tablet (5 mg total) by mouth daily.   Coenzyme Q10 200 MG capsule Take 200 mg by mouth daily.   cyanocobalamin 1000 MCG tablet Commonly known as: VITAMIN B12 Take 1,000 mcg by mouth daily.   fluticasone 50 MCG/ACT nasal spray Commonly known as: FLONASE Place 2 sprays into both nostrils 2 (two) times daily.   hydrochlorothiazide 12.5 MG capsule Commonly known as: MICROZIDE Take 1 capsule (12.5 mg total) by mouth daily.   ibuprofen 200 MG tablet Commonly known as: ADVIL Take 600 mg by mouth every 6 (six) hours as needed for mild pain.   levothyroxine 150 MCG tablet Commonly known as: Euthyrox Take 1 tablet (150 mcg total) by mouth  daily before breakfast.   magnesium oxide 400 MG tablet Commonly known as: MAG-OX Take 400 mg by mouth daily.   metFORMIN 500 MG tablet Commonly known as: GLUCOPHAGE Take 1 tablet (500 mg total) by mouth 2 (two) times daily with a meal.   metoprolol succinate 100 MG 24 hr tablet Commonly known as: TOPROL-XL Take 1 tablet (100 mg total) by mouth 2 (two) times daily. Take with or immediately following a meal.   milk thistle 175 MG tablet Take 175 mg by mouth daily.   oxyCODONE 5 MG immediate release tablet Commonly known as: Oxy IR/ROXICODONE Take 1 tablet (5 mg total) by mouth every 6 (six) hours as needed for severe pain.   oxymetazoline 0.05 % nasal spray Commonly known as: AFRIN Place 1 spray into both nostrils 2 (two) times daily as needed for congestion.   polyethylene glycol 17 g packet Commonly known as: MIRALAX / GLYCOLAX Take 17 g by mouth daily as needed for mild constipation.   sitaGLIPtin 25 MG tablet Commonly known as: Januvia Take 1 tablet (25 mg total) by mouth daily.   valsartan 160 MG tablet Commonly known as: DIOVAN Take 1 tablet (160 mg total) by mouth daily.   Vitamin D3 125 MCG (5000 UT) Caps Take 5,000 Units by mouth daily.   Vitamin K2 100 MCG Caps Take 100 mcg by mouth daily.   zinc gluconate 50 MG tablet Take 1 tablet by mouth daily.          Follow-up Information  Sur[facility], [place]. Go on 10/09/2021.   Specialty: General Surgery Why: 3:30 PM with Malachi Pro, PA-C. Please arrive 30 min prior to appointment time. Bring ID and insurance information with you. Contact information: [address] [place] [address] [phone]                  Signed: Wellington Hampshire, [facility] 09/22/2021, 3:51 PM Please see Amion for pager number during day hours 7:00am-4:30pm

## 2021-09-23 LAB — SURGICAL PATHOLOGY

## 2021-10-01 ENCOUNTER — Ambulatory Visit: Payer: BC Managed Care – PPO | Admitting: Physician Assistant

## 2021-10-20 ENCOUNTER — Other Ambulatory Visit: Payer: Self-pay | Admitting: Nurse Practitioner

## 2021-10-23 ENCOUNTER — Other Ambulatory Visit: Payer: Self-pay

## 2021-10-23 ENCOUNTER — Encounter: Payer: Self-pay | Admitting: Internal Medicine

## 2021-10-23 DIAGNOSIS — R932 Abnormal findings on diagnostic imaging of liver and biliary tract: Secondary | ICD-10-CM

## 2021-10-23 DIAGNOSIS — K766 Portal hypertension: Secondary | ICD-10-CM

## 2021-10-23 DIAGNOSIS — K7460 Unspecified cirrhosis of liver: Secondary | ICD-10-CM

## 2021-11-04 ENCOUNTER — Other Ambulatory Visit: Payer: Self-pay | Admitting: Internal Medicine

## 2021-11-04 ENCOUNTER — Ambulatory Visit (HOSPITAL_COMMUNITY)
Admission: RE | Admit: 2021-11-04 | Discharge: 2021-11-04 | Disposition: A | Discharge status: Home / Self Care | Payer: BC Managed Care – PPO | Source: Ambulatory Visit | Attending: Internal Medicine | Admitting: Internal Medicine

## 2021-11-04 DIAGNOSIS — R932 Abnormal findings on diagnostic imaging of liver and biliary tract: Secondary | ICD-10-CM | POA: Insufficient documentation

## 2021-11-04 DIAGNOSIS — K766 Portal hypertension: Secondary | ICD-10-CM | POA: Insufficient documentation

## 2021-11-04 DIAGNOSIS — K7460 Unspecified cirrhosis of liver: Secondary | ICD-10-CM

## 2021-11-04 DIAGNOSIS — K5730 Diverticulosis of large intestine without perforation or abscess without bleeding: Secondary | ICD-10-CM | POA: Diagnosis not present

## 2021-11-04 DIAGNOSIS — K760 Fatty (change of) liver, not elsewhere classified: Secondary | ICD-10-CM | POA: Diagnosis not present

## 2021-11-04 DIAGNOSIS — K7581 Nonalcoholic steatohepatitis (NASH): Secondary | ICD-10-CM | POA: Insufficient documentation

## 2021-11-04 DIAGNOSIS — D3502 Benign neoplasm of left adrenal gland: Secondary | ICD-10-CM | POA: Diagnosis not present

## 2021-11-04 MED ORDER — GADOBUTROL 1 MMOL/ML IV SOLN
10.0000 mL | Freq: Once | INTRAVENOUS | Status: AC | PRN
  Administered 2021-11-04: 10 mL via INTRAVENOUS

## 2021-11-10 ENCOUNTER — Other Ambulatory Visit: Payer: Self-pay

## 2021-11-10 DIAGNOSIS — Z0000 Encounter for general adult medical examination without abnormal findings: Secondary | ICD-10-CM

## 2021-11-11 ENCOUNTER — Other Ambulatory Visit: Payer: BC Managed Care – PPO

## 2021-11-11 DIAGNOSIS — Z0000 Encounter for general adult medical examination without abnormal findings: Secondary | ICD-10-CM | POA: Diagnosis not present

## 2021-11-12 LAB — CBC
Hematocrit: 43.1 % (ref 34.0–46.6)
Hemoglobin: 14.3 g/dL (ref 11.1–15.9)
MCH: 30.7 pg (ref 26.6–33.0)
MCHC: 33.2 g/dL (ref 31.5–35.7)
MCV: 93 fL (ref 79–97)
Platelets: 233 10*3/uL (ref 150–450)
RBC: 4.66 x10E6/uL (ref 3.77–5.28)
RDW: 13.7 % (ref 11.7–15.4)
WBC: 6.7 10*3/uL (ref 3.4–10.8)

## 2021-11-12 LAB — COMPREHENSIVE METABOLIC PANEL
ALT: 70 IU/L — ABNORMAL HIGH (ref 0–32)
AST: 39 IU/L (ref 0–40)
Albumin/Globulin Ratio: 2.3 — ABNORMAL HIGH (ref 1.2–2.2)
Albumin: 4.6 g/dL (ref 3.8–4.9)
Alkaline Phosphatase: 65 IU/L (ref 44–121)
BUN/Creatinine Ratio: 9 (ref 9–23)
BUN: 6 mg/dL (ref 6–24)
Bilirubin Total: 0.7 mg/dL (ref 0.0–1.2)
CO2: 23 mmol/L (ref 20–29)
Calcium: 10 mg/dL (ref 8.7–10.2)
Chloride: 94 mmol/L — ABNORMAL LOW (ref 96–106)
Creatinine, Ser: 0.66 mg/dL (ref 0.57–1.00)
Globulin, Total: 2 g/dL (ref 1.5–4.5)
Glucose: 193 mg/dL — ABNORMAL HIGH (ref 70–99)
Potassium: 4.1 mmol/L (ref 3.5–5.2)
Sodium: 136 mmol/L (ref 134–144)
Total Protein: 6.6 g/dL (ref 6.0–8.5)
eGFR: 101 mL/min/{1.73_m2} (ref >59)

## 2021-11-12 LAB — TSH: TSH: 11.9 u[IU]/mL — ABNORMAL HIGH (ref 0.450–4.500)

## 2021-11-12 LAB — LIPID PANEL
Chol/HDL Ratio: 7.1 ratio — ABNORMAL HIGH (ref 0.0–4.4)
Cholesterol, Total: 241 mg/dL — ABNORMAL HIGH (ref 100–199)
HDL: 34 mg/dL — ABNORMAL LOW (ref >39)
LDL Chol Calc (NIH): 137 mg/dL — ABNORMAL HIGH (ref 0–99)
Triglycerides: 381 mg/dL — ABNORMAL HIGH (ref 0–149)
VLDL Cholesterol Cal: 70 mg/dL — ABNORMAL HIGH (ref 5–40)

## 2021-11-12 NOTE — Progress Notes (Signed)
Lipids still high, though slightly improved. TSH very elevated. LFT higher than usual. Will get HgbA1c at next visit. Discuss with her at visit 11/18/2021

## 2021-11-17 NOTE — Progress Notes (Signed)
Established patient visit   Patient: Miranda Delacruz   DOB: [birth year]   61 y.o. Female  MRN: [id number] Visit Date: 11/18/2021   Chief Complaint  Patient presents with   Follow-up   Diabetes   Subjective    HPI  Annual physica  -type 2 diabetes.  -most recent HgbA1c 6.0  -due for check today - result is 6.1  Routine fasting labs done prior to today's visit.  --blood sugar 193 --elevated LFTs, however stable. She does see gastroenterology.  --moderate, generalized elevation of lipid panel. Has not tolerated statin in the past. Will discuss again.  --TSH elevated at 11.900. will need to increase levothyroxine dose -she did have her gallbladder removed about 2 months ago. Had pain which lasted a little longer than expected.  -continues to be followed by gastroenterology.  -had abdominal MRI. Improved appearance of the liver and reduced splenomegaly. No reverse blood flow which is indicative of portal hypertension.    Medications: Outpatient Medications Prior to Visit  Medication Sig   amLODipine (NORVASC) 5 MG tablet Take 1 tablet (5 mg total) by mouth daily.   Cholecalciferol (VITAMIN D3) 125 MCG (5000 UT) CAPS Take 5,000 Units by mouth daily.   Coenzyme Q10 200 MG capsule Take 200 mg by mouth daily.   fluticasone (FLONASE) 50 MCG/ACT nasal spray Place 2 sprays into both nostrils 2 (two) times daily.   hydrochlorothiazide (MICROZIDE) 12.5 MG capsule Take 1 capsule (12.5 mg total) by mouth daily.   magnesium oxide (MAG-OX) 400 MG tablet Take 400 mg by mouth daily.   Menaquinone-7 (VITAMIN K2) 100 MCG CAPS Take 100 mcg by mouth daily.   metFORMIN (GLUCOPHAGE) 500 MG tablet Take 1 tablet (500 mg total) by mouth 2 (two) times daily with a meal.   metoprolol succinate (TOPROL-XL) 100 MG 24 hr tablet Take 1 tablet (100 mg total) by mouth 2 (two) times daily. Take with or immediately following a meal.   milk thistle 175 MG tablet Take 175 mg by mouth daily.   ONETOUCH VERIO test strip  1 each daily.   oxymetazoline (AFRIN) 0.05 % nasal spray Place 1 spray into both nostrils 2 (two) times daily as needed for congestion.   sitaGLIPtin (JANUVIA) 25 MG tablet    valsartan (DIOVAN) 160 MG tablet Take 1 tablet (160 mg total) by mouth daily.   zinc gluconate 50 MG tablet Take 1 tablet by mouth daily.   [DISCONTINUED] levothyroxine (EUTHYROX) 150 MCG tablet Take 1 tablet (150 mcg total) by mouth daily before breakfast.   [DISCONTINUED] acetaminophen (TYLENOL) 325 MG tablet Take 2 tablets (650 mg total) by mouth every 6 (six) hours as needed for mild pain (or temp > 100).   [DISCONTINUED] ibuprofen (ADVIL) 200 MG tablet Take 600 mg by mouth every 6 (six) hours as needed for mild pain.   [DISCONTINUED] oxyCODONE (OXY IR/ROXICODONE) 5 MG immediate release tablet Take 1 tablet (5 mg total) by mouth every 6 (six) hours as needed for severe pain.   [DISCONTINUED] polyethylene glycol (MIRALAX / GLYCOLAX) 17 g packet Take 17 g by mouth daily as needed for mild constipation.   [DISCONTINUED] sitaGLIPtin (JANUVIA) 25 MG tablet Take 1 tablet (25 mg total) by mouth daily.   [DISCONTINUED] vitamin B-12 (CYANOCOBALAMIN) 1000 MCG tablet Take 1,000 mcg by mouth daily.   No facility-administered medications prior to visit.    Review of Systems  Constitutional:  Positive for fatigue. Negative for activity change, appetite change, chills and fever.  HENT:  Negative for  congestion, postnasal drip, rhinorrhea, sinus pressure, sinus pain, sneezing and sore throat.   Eyes: Negative.   Respiratory:  Negative for cough, chest tightness, shortness of breath and wheezing.   Cardiovascular:  Negative for chest pain and palpitations.  Gastrointestinal:  Negative for abdominal pain, constipation, diarrhea, nausea and vomiting.  Endocrine: Negative for cold intolerance, heat intolerance, polydipsia and polyuria.       Blood sugars doing well   Thyroid underactive   Genitourinary:  Negative for dyspareunia,  dysuria, flank pain, frequency and urgency.  Musculoskeletal:  Negative for arthralgias, back pain and myalgias.  Skin:  Negative for rash.  Allergic/Immunologic: Positive for environmental allergies.  Neurological:  Negative for dizziness, weakness and headaches.  Hematological:  Negative for adenopathy.  Psychiatric/Behavioral:  The patient is not nervous/anxious.     Last CBC Lab Results  Component Value Date   WBC 6.7 11/11/2021   HGB 14.3 11/11/2021   HCT 43.1 11/11/2021   MCV 93 11/11/2021   MCH 30.7 11/11/2021   RDW 13.7 11/11/2021   PLT 233 33/82/5053   Last metabolic panel Lab Results  Component Value Date   GLUCOSE 193 (H) 11/11/2021   NA 136 11/11/2021   K 4.1 11/11/2021   CL 94 (L) 11/11/2021   CO2 23 11/11/2021   BUN 6 11/11/2021   CREATININE 0.66 11/11/2021   EGFR 101 11/11/2021   CALCIUM 10.0 11/11/2021   PROT 6.6 11/11/2021   ALBUMIN 4.6 11/11/2021   LABGLOB 2.0 11/11/2021   AGRATIO 2.3 (H) 11/11/2021   BILITOT 0.7 11/11/2021   ALKPHOS 65 11/11/2021   AST 39 11/11/2021   ALT 70 (H) 11/11/2021   ANIONGAP 8 09/19/2021   Last lipids Lab Results  Component Value Date   CHOL 241 (H) 11/11/2021   HDL 34 (L) 11/11/2021   LDLCALC 137 (H) 11/11/2021   TRIG 381 (H) 11/11/2021   CHOLHDL 7.1 (H) 11/11/2021   Last hemoglobin A1c Lab Results  Component Value Date   HGBA1C 6.1 (A) 11/18/2021   Last thyroid functions Lab Results  Component Value Date   TSH 11.900 (H) 11/11/2021   Last vitamin D Lab Results  Component Value Date   VD25OH 80.3 09/05/2020       Objective     Today's Vitals   11/18/21 0944  BP: 124/74  Pulse: 82  SpO2: 95%  Weight: 260 lb 12.8 oz (118.3 kg)   Body mass index is 39.65 kg/m.   BP Readings from Last 3 Encounters:  11/18/21 124/74  09/19/21 (Abnormal) 151/82  09/18/21 (Abnormal) 143/72    Wt Readings from Last 3 Encounters:  11/18/21 260 lb 12.8 oz (118.3 kg)  09/19/21 254 lb 3.1 oz (115.3 kg)  09/16/21  255 lb (115.7 kg)    Physical Exam Vitals and nursing note reviewed.  Constitutional:      Appearance: Normal appearance. She is well-developed.  HENT:     Head: Normocephalic and atraumatic.     Right Ear: Tympanic membrane, ear canal and external ear normal.     Left Ear: Tympanic membrane, ear canal and external ear normal.     Nose: Nose normal.     Mouth/Throat:     Mouth: Mucous membranes are moist.     Pharynx: Oropharynx is clear.  Eyes:     Extraocular Movements: Extraocular movements intact.     Conjunctiva/sclera: Conjunctivae normal.     Pupils: Pupils are equal, round, and reactive to light.  Neck:     Vascular: No  carotid bruit.  Cardiovascular:     Rate and Rhythm: Normal rate and regular rhythm.     Pulses: Normal pulses.     Heart sounds: Normal heart sounds.  Pulmonary:     Effort: Pulmonary effort is normal.     Breath sounds: Normal breath sounds.  Abdominal:     General: Bowel sounds are normal. There is no distension.     Palpations: Abdomen is soft. There is no mass.     Tenderness: There is no abdominal tenderness. There is no right CVA tenderness, left CVA tenderness, guarding or rebound.     Hernia: No hernia is present.  Musculoskeletal:        General: Normal range of motion.     Cervical back: Normal range of motion and neck supple.  Lymphadenopathy:     Cervical: No cervical adenopathy.  Skin:    General: Skin is warm and dry.     Capillary Refill: Capillary refill takes less than 2 seconds.  Neurological:     General: No focal deficit present.     Mental Status: She is alert and oriented to person, place, and time.  Psychiatric:        Mood and Affect: Mood normal.        Behavior: Behavior normal.        Thought Content: Thought content normal.        Judgment: Judgment normal.      Results for orders placed or performed in visit on 11/18/21  POCT glycosylated hemoglobin (Hb A1C)  Result Value Ref Range   Hemoglobin A1C 6.1 (A) 4.0  - 5.6 %   HbA1c POC (<> result, manual entry)     HbA1c, POC (prediabetic range)     HbA1c, POC (controlled diabetic range)      Assessment & Plan    1. Encounter for general adult medical examination with abnormal findings Annual physical today.  2. Type 2 diabetes mellitus without complication, without long-term current use of insulin (HCC) Hemoglobin A1c is 6.1.  Continue medication as prescribed.  Refer for diabetic eye exam. - POCT glycosylated hemoglobin (Hb A1C) - Ambulatory referral to Ophthalmology  3. Hypertension associated with diabetes ([place]) Blood pressure stable.  Continue medication as prescribed.  4. Acquired hypothyroidism Increase levothyroxine to 125 mcg daily.  Recheck thyroid panel in 3 months.  Adjust levothyroxine as indicated. - levothyroxine (EUTHYROX) 175 MCG tablet; Take 1 tablet (175 mcg total) by mouth daily before breakfast.  Dispense: 90 tablet; Refill: 1  5. Hepatic steatosis Continue regular visits with GI provider.  6. Encounter for screening mammogram for malignant neoplasm of breast Order for screening mammogram placed. - MM DIGITAL SCREENING BILATERAL; Future    Problem List Items Addressed This Visit       Cardiovascular and Mediastinum   Hypertension associated with diabetes ([place])   Relevant Medications   sitaGLIPtin (JANUVIA) 25 MG tablet   Other Relevant Orders   POCT glycosylated hemoglobin (Hb A1C) (Completed)     Digestive   Hepatic steatosis     Endocrine   Hypothyroidism (Chronic)   Relevant Medications   levothyroxine (EUTHYROX) 175 MCG tablet   Type 2 diabetes mellitus without complication, without long-term current use of insulin (HCC)   Relevant Medications   sitaGLIPtin (JANUVIA) 25 MG tablet   Other Relevant Orders   POCT glycosylated hemoglobin (Hb A1C) (Completed)   Ambulatory referral to Ophthalmology     Other   Screening for breast cancer   Relevant  Orders   MM DIGITAL SCREENING BILATERAL   Other Visit  Diagnoses     Encounter for general adult medical examination with abnormal findings    -  Primary        Return in about 3 months (around 02/17/2022) for diabetes with HgbA1c check, Fasting lipids, TSH, Free t4, and CMP.         Ronnell Freshwater, NP  [facility] [phone] (phone) [phone] (fax)  [place]

## 2021-11-18 ENCOUNTER — Encounter: Payer: Self-pay | Admitting: Nurse Practitioner

## 2021-11-18 ENCOUNTER — Ambulatory Visit (INDEPENDENT_AMBULATORY_CARE_PROVIDER_SITE_OTHER): Payer: BC Managed Care – PPO | Admitting: Nurse Practitioner

## 2021-11-18 VITALS — BP 124/74 | HR 82 | Ht 66.93 in | Wt 260.8 lb

## 2021-11-18 DIAGNOSIS — E119 Type 2 diabetes mellitus without complications: Secondary | ICD-10-CM

## 2021-11-18 DIAGNOSIS — E039 Hypothyroidism, unspecified: Secondary | ICD-10-CM | POA: Diagnosis not present

## 2021-11-18 DIAGNOSIS — I152 Hypertension secondary to endocrine disorders: Secondary | ICD-10-CM

## 2021-11-18 DIAGNOSIS — Z0001 Encounter for general adult medical examination with abnormal findings: Secondary | ICD-10-CM

## 2021-11-18 DIAGNOSIS — E1169 Type 2 diabetes mellitus with other specified complication: Secondary | ICD-10-CM

## 2021-11-18 DIAGNOSIS — Z1231 Encounter for screening mammogram for malignant neoplasm of breast: Secondary | ICD-10-CM

## 2021-11-18 DIAGNOSIS — E1159 Type 2 diabetes mellitus with other circulatory complications: Secondary | ICD-10-CM | POA: Diagnosis not present

## 2021-11-18 DIAGNOSIS — K760 Fatty (change of) liver, not elsewhere classified: Secondary | ICD-10-CM

## 2021-11-18 DIAGNOSIS — E782 Mixed hyperlipidemia: Secondary | ICD-10-CM

## 2021-11-18 LAB — POCT GLYCOSYLATED HEMOGLOBIN (HGB A1C): Hemoglobin A1C: 6.1 % — AB (ref 4.0–5.6)

## 2021-11-18 MED ORDER — LEVOTHYROXINE SODIUM 175 MCG PO TABS: 175.0000 ug | ORAL_TABLET | Freq: Every day | ORAL | 1 refills | Status: DC

## 2022-01-05 ENCOUNTER — Other Ambulatory Visit: Payer: Self-pay | Admitting: Nurse Practitioner

## 2022-01-05 DIAGNOSIS — E119 Type 2 diabetes mellitus without complications: Secondary | ICD-10-CM

## 2022-01-06 ENCOUNTER — Ambulatory Visit: Payer: BC Managed Care – PPO | Admitting: Physician Assistant

## 2022-01-06 ENCOUNTER — Ambulatory Visit
Admission: EM | Admit: 2022-01-06 | Discharge: 2022-01-06 | Disposition: A | Discharge status: Home / Self Care | Payer: BC Managed Care – PPO | Attending: Internal Medicine | Admitting: Internal Medicine

## 2022-01-06 DIAGNOSIS — B349 Viral infection, unspecified: Secondary | ICD-10-CM | POA: Diagnosis not present

## 2022-01-06 DIAGNOSIS — Z1152 Encounter for screening for COVID-19: Secondary | ICD-10-CM | POA: Insufficient documentation

## 2022-01-06 DIAGNOSIS — R051 Acute cough: Secondary | ICD-10-CM | POA: Insufficient documentation

## 2022-01-06 MED ORDER — BENZONATATE 100 MG PO CAPS: 100.0000 mg | ORAL_CAPSULE | Freq: Three times a day (TID) | ORAL | 0 refills | Status: DC | PRN

## 2022-01-06 NOTE — ED Provider Notes (Signed)
[name]    CSN: [id number] Arrival date & time: 01/06/22  1329      History   Chief Complaint Chief Complaint  Patient presents with   Cough   Nasal Congestion    HPI Miranda Delacruz is a 61 y.o. female.   Patient presents with dry cough and postnasal drip that has been present for about 3 days.  Denies nasal congestion, runny nose, sore throat, ear pain, chest pain, shortness of breath, nausea, vomiting, diarrhea, abdominal pain.  Patient reports her husband is currently sick with similar symptoms.  She has been taking over-the-counter cough and cold medication and Tessalon Perles that her husband has with some temporary improvement in cough.  She denies any formal diagnosis of asthma or COPD.   Cough   Past Medical History:  Diagnosis Date   Colon polyps    Diabetes mellitus without complication ([place])    Gallstones    GERD (gastroesophageal reflux disease)    Hyperlipidemia    Hypertension    Hypothyroidism    Thyroid cancer ([place])    thyroid    Patient Active Problem List   Diagnosis Date Noted   Acute cholecystitis 09/18/2021   Calculus of gallbladder without cholecystitis without obstruction 03/18/2021   Elevated liver function tests 03/18/2021   Acute hip pain, left 01/29/2021   Muscle pain 01/29/2021   Acute pain of right knee 05/17/2020   Consumes a vegan diet- currently, for now 09/29/2018   Statin declined 05/26/2018   Type 2 diabetes mellitus with hypertriglyceridemia ([place]) 04/22/2018   Type 2 diabetes mellitus without complication, without long-term current use of insulin ([place]) 12/16/2017   Hypertension associated with diabetes ([place]) 12/16/2017   Mixed diabetic hyperlipidemia associated with type 2 diabetes mellitus ([place]) 12/16/2017   Microalbuminuria due to type 2 diabetes mellitus ([place]) 12/16/2017   Dysuria 02/08/2017   Abnormal urinalysis 02/08/2017   Counseling on health promotion and disease prevention 07/31/2016   Cervical  cancer screening 07/31/2016   Screening for breast cancer 07/31/2016   Low serum HDL 10/24/2015   Menopause present 09/25/2015   Migraine headache with aura 02/11/2015   Special screening for malignant neoplasms, colon    Benign neoplasm of ascending colon    Benign neoplasm of transverse colon    Benign neoplasm of sigmoid colon    Hepatic steatosis 12/18/2014   Elevated liver enzymes- from fatty liver disease. 12/10/2014   Hyperlipidemia 12/10/2014   Vitamin D deficiency 12/10/2014   Obesity, Class III, BMI 40-49.9 (morbid obesity) ([place]) 11/21/2014   Hypothyroidism 11/21/2014   HTN (hypertension) 11/21/2014   Hx of papillary thyroid cancer 11/21/2014    Past Surgical History:  Procedure Laterality Date   CHOLECYSTECTOMY N/A 09/19/2021   Procedure: LAPAROSCOPIC CHOLECYSTECTOMY WITH ICG;  Surgeon: Ileana Roup, MD;  Location: [facility] ORS;  Service: General;  Laterality: N/A;   COLONOSCOPY  2000   normal   COLONOSCOPY     COLONOSCOPY WITH PROPOFOL N/A 01/22/2015   Procedure: COLONOSCOPY WITH PROPOFOL;  Surgeon: Lucilla Lame, MD;  Location: [facility] ENDOSCOPY;  Service: Endoscopy;  Laterality: N/A;   POLYPECTOMY     THYROIDECTOMY  2009   TONSILLECTOMY AND ADENOIDECTOMY  1976   TUBAL LIGATION  1997    OB History     Gravida  2   Para  2   Term  2   Preterm      AB      Living  2      SAB  IAB      Ectopic      Multiple      Live Births  2            Home Medications    Prior to Admission medications   Medication Sig Start Date End Date Taking? Authorizing Provider  benzonatate (TESSALON) 100 MG capsule Take 1 capsule (100 mg total) by mouth every 8 (eight) hours as needed for cough. 01/06/22  Yes Marvette Schamp, Hildred Alamin E, FNP  amLODipine (NORVASC) 5 MG tablet Take 1 tablet (5 mg total) by mouth daily. 08/18/21   Ronnell Freshwater, NP  Cholecalciferol (VITAMIN D3) 125 MCG (5000 UT) CAPS Take 5,000 Units by mouth daily. 09/26/18   [provider]   Coenzyme Q10 200 MG capsule Take 200 mg by mouth daily. 05/24/16   [provider]  fluticasone (FLONASE) 50 MCG/ACT nasal spray Place 2 sprays into both nostrils 2 (two) times daily.    [provider]  hydrochlorothiazide (MICROZIDE) 12.5 MG capsule Take 1 capsule (12.5 mg total) by mouth daily. 08/18/21   Ronnell Freshwater, NP  levothyroxine (EUTHYROX) 175 MCG tablet Take 1 tablet (175 mcg total) by mouth daily before breakfast. 11/18/21   Ronnell Freshwater, NP  magnesium oxide (MAG-OX) 400 MG tablet Take 400 mg by mouth daily.    [provider]  Menaquinone-7 (VITAMIN K2) 100 MCG CAPS Take 100 mcg by mouth daily. 09/26/18   [provider]  metFORMIN (GLUCOPHAGE) 500 MG tablet TAKE 1 TABLET BY MOUTH TWICE DAILY WITH A MEAL 01/05/22   Ronnell Freshwater, NP  metoprolol succinate (TOPROL-XL) 100 MG 24 hr tablet Take 1 tablet (100 mg total) by mouth 2 (two) times daily. Take with or immediately following a meal. 08/18/21   Boscia, Greer Ee, NP  milk thistle 175 MG tablet Take 175 mg by mouth daily.    [provider]  [facility] VERIO test strip 1 each daily. 09/21/21   [provider]  oxymetazoline (AFRIN) 0.05 % nasal spray Place 1 spray into both nostrils 2 (two) times daily as needed for congestion.    [provider]  sitaGLIPtin (JANUVIA) 25 MG tablet  10/29/20   [provider]  valsartan (DIOVAN) 160 MG tablet Take 1 tablet (160 mg total) by mouth daily. 08/18/21   Ronnell Freshwater, NP  zinc gluconate 50 MG tablet Take 1 tablet by mouth daily.    [provider]    Family History Family History  Problem Relation Age of Onset   Hypertension Mother    Hypertension Maternal Grandmother    Stroke Maternal Grandmother    Colon polyps Neg Hx    Colon cancer Neg Hx    Esophageal cancer Neg Hx    Rectal cancer Neg Hx    Stomach cancer Neg Hx     Social History Social History   Tobacco Use   Smoking status:  Former    Types: Cigarettes    Quit date: 02/24/1987    Years since quitting: 34.8   Smokeless tobacco: Never  Vaping Use   Vaping Use: Never used  Substance Use Topics   Alcohol use: Yes    Comment: 5 per day   Drug use: No     Allergies   Patient has no known allergies.   Review of Systems Review of Systems Per HPI  Physical Exam Triage Vital Signs ED Triage Vitals  Enc Vitals Group     BP 01/06/22 1427 127/77  Pulse Rate 01/06/22 1427 68     Resp 01/06/22 1427 19     Temp 01/06/22 1427 98.1 F (36.7 C)     Temp src --      SpO2 01/06/22 1427 97 %     Weight --      Height --      Head Circumference --      Peak Flow --      Pain Score 01/06/22 1426 0     Pain Loc --      Pain Edu? --      Excl. in [place]? --    No data found.  Updated Vital Signs BP 127/77   Pulse 68   Temp 98.1 F (36.7 C)   Resp 19   SpO2 97%   Visual Acuity Right Eye Distance:   Left Eye Distance:   Bilateral Distance:    Right Eye Near:   Left Eye Near:    Bilateral Near:     Physical Exam Constitutional:      General: She is not in acute distress.    Appearance: Normal appearance. She is not toxic-appearing or diaphoretic.  HENT:     Head: Normocephalic and atraumatic.     Right Ear: Tympanic membrane and ear canal normal.     Left Ear: Tympanic membrane and ear canal normal.     Nose: Congestion present.     Mouth/Throat:     Mouth: Mucous membranes are moist.     Pharynx: No posterior oropharyngeal erythema.  Eyes:     Extraocular Movements: Extraocular movements intact.     Conjunctiva/sclera: Conjunctivae normal.     Pupils: Pupils are equal, round, and reactive to light.  Cardiovascular:     Rate and Rhythm: Normal rate and regular rhythm.     Pulses: Normal pulses.     Heart sounds: Normal heart sounds.  Pulmonary:     Effort: Pulmonary effort is normal. No respiratory distress.     Breath sounds: Normal breath sounds. No stridor. No wheezing, rhonchi or  rales.  Abdominal:     General: Abdomen is flat. Bowel sounds are normal.     Palpations: Abdomen is soft.  Musculoskeletal:        General: Normal range of motion.     Cervical back: Normal range of motion.  Skin:    General: Skin is warm and dry.  Neurological:     General: No focal deficit present.     Mental Status: She is alert and oriented to person, place, and time. Mental status is at baseline.  Psychiatric:        Mood and Affect: Mood normal.        Behavior: Behavior normal.      UC Treatments / Results  Labs (all labs ordered are listed, but only abnormal results are displayed) Labs Reviewed  SARS CORONAVIRUS 2 (TAT 6-24 HRS)    EKG   Radiology No results found.  Procedures Procedures (including critical care time)  Medications Ordered in UC Medications - No data to display  Initial Impression / Assessment and Plan / UC Course  I have reviewed the triage vital signs and the nursing notes.  Pertinent labs & imaging results that were available during my care of the patient were reviewed by me and considered in my medical decision making (see chart for details).     Patient's symptoms appear viral in etiology.  COVID test is pending.  Tessalon perles sent for patient to  have for herself.  Discussed supportive care and symptom management as well with patient.  Discussed return precautions.  Patient verbalized understanding and was agreeable with plan. Final Clinical Impressions(s) / UC Diagnoses   Final diagnoses:  Acute cough  Viral illness     Discharge Instructions      Cough is most likely viral in nature.  It should run its course and self resolve with symptomatic treatment.  I have prescribed you a cough medication take as needed.  COVID test is pending.  We will call if it is positive.    ED Prescriptions     Medication Sig Dispense Auth. Provider   benzonatate (TESSALON) 100 MG capsule Take 1 capsule (100 mg total) by mouth every 8  (eight) hours as needed for cough. 21 capsule [place], Michele Rockers, [place]      PDMP not reviewed this encounter.   Teodora Medici, [place] 01/06/22 1447

## 2022-01-06 NOTE — ED Triage Notes (Signed)
Pt presents to uc with co of cough and post nasel drip for 3 days Pt reports husband is sick and is on prednisone but has copd. She thinks he has some sort of uri that got her sick, pt reports no fevers. Pt reports some otc medication with minimal Improvement.

## 2022-01-06 NOTE — Discharge Instructions (Signed)
Cough is most likely viral in nature.  It should run its course and self resolve with symptomatic treatment.  I have prescribed you a cough medication take as needed.  COVID test is pending.  We will call if it is positive.

## 2022-01-07 ENCOUNTER — Ambulatory Visit: Payer: BC Managed Care – PPO | Admitting: Physician Assistant

## 2022-01-07 LAB — SARS CORONAVIRUS 2 (TAT 6-24 HRS): SARS Coronavirus 2: NEGATIVE

## 2022-01-08 ENCOUNTER — Telehealth: Payer: BC Managed Care – PPO | Admitting: Physician Assistant

## 2022-01-08 DIAGNOSIS — B9689 Other specified bacterial agents as the cause of diseases classified elsewhere: Secondary | ICD-10-CM

## 2022-01-08 DIAGNOSIS — J208 Acute bronchitis due to other specified organisms: Secondary | ICD-10-CM

## 2022-01-08 MED ORDER — DOXYCYCLINE HYCLATE 100 MG PO TABS: 100.0000 mg | ORAL_TABLET | Freq: Two times a day (BID) | ORAL | 0 refills | Status: DC

## 2022-01-08 MED ORDER — PROMETHAZINE-DM 6.25-15 MG/5ML PO SYRP: 5.0000 mL | ORAL_SOLUTION | Freq: Four times a day (QID) | ORAL | 0 refills | Status: DC | PRN

## 2022-01-08 MED ORDER — PREDNISONE 20 MG PO TABS: 40.0000 mg | ORAL_TABLET | Freq: Every day | ORAL | 0 refills | Status: DC

## 2022-01-08 NOTE — Progress Notes (Signed)
Virtual Visit Consent   Miranda Delacruz, you are scheduled for a virtual visit with a [facility] provider today. Just as with appointments in the office, your consent must be obtained to participate. Your consent will be active for this visit and any virtual visit you may have with one of our providers in the next 365 days. If you have a MyChart account, a copy of this consent can be sent to you electronically.  As this is a virtual visit, video technology does not allow for your provider to perform a traditional examination. This may limit your provider's ability to fully assess your condition. If your provider identifies any concerns that need to be evaluated in person or the need to arrange testing (such as labs, EKG, etc.), we will make arrangements to do so. Although advances in technology are sophisticated, we cannot ensure that it will always work on either your end or our end. If the connection with a video visit is poor, the visit may have to be switched to a telephone visit. With either a video or telephone visit, we are not always able to ensure that we have a secure connection.  By engaging in this virtual visit, you consent to the provision of healthcare and authorize for your insurance to be billed (if applicable) for the services provided during this visit. Depending on your insurance coverage, you may receive a charge related to this service.  I need to obtain your verbal consent now. Are you willing to proceed with your visit today? Miranda Delacruz has provided verbal consent on 01/08/2022 for a virtual visit (video or telephone). Miranda Delacruz, [place]  Date: 01/08/2022 8:28 AM  Virtual Visit via Video Note   I, Miranda Delacruz, connected with  Miranda Delacruz  ([id number], 1961-02-14) on 01/08/22 at  8:15 AM EST by a video-enabled telemedicine application and verified that I am speaking with the correct person using two identifiers.  Location: Patient: Virtual Visit Location  Patient: Home Provider: Virtual Visit Location Provider: Home Office   I discussed the limitations of evaluation and management by telemedicine and the availability of in person appointments. The patient expressed understanding and agreed to proceed.    History of Present Illness: Miranda Delacruz is a 61 y.o. who identifies as a female who was assigned female at birth, and is being seen today for progressively worsening chest congestion and cough now with chest wall tenderness and development of some sinus pressure. Was seen at UC few days ago and tested negative for COVID. Was started on tessalon perles and supportive measures. Symptoms have continued to significantly progress since that time.   HPI: HPI  Problems:  Patient Active Problem List   Diagnosis Date Noted   Acute cholecystitis 09/18/2021   Calculus of gallbladder without cholecystitis without obstruction 03/18/2021   Elevated liver function tests 03/18/2021   Acute hip pain, left 01/29/2021   Muscle pain 01/29/2021   Acute pain of right knee 05/17/2020   Consumes a vegan diet- currently, for now 09/29/2018   Statin declined 05/26/2018   Type 2 diabetes mellitus with hypertriglyceridemia ([place]) 04/22/2018   Type 2 diabetes mellitus without complication, without long-term current use of insulin ([place]) 12/16/2017   Hypertension associated with diabetes ([place]) 12/16/2017   Mixed diabetic hyperlipidemia associated with type 2 diabetes mellitus ([place]) 12/16/2017   Microalbuminuria due to type 2 diabetes mellitus ([place]) 12/16/2017   Dysuria 02/08/2017   Abnormal urinalysis 02/08/2017   Counseling on health promotion and disease prevention 07/31/2016  Cervical cancer screening 07/31/2016   Screening for breast cancer 07/31/2016   Low serum HDL 10/24/2015   Menopause present 09/25/2015   Migraine headache with aura 02/11/2015   Special screening for malignant neoplasms, colon    Benign neoplasm of ascending colon    Benign neoplasm  of transverse colon    Benign neoplasm of sigmoid colon    Hepatic steatosis 12/18/2014   Elevated liver enzymes- from fatty liver disease. 12/10/2014   Hyperlipidemia 12/10/2014   Vitamin D deficiency 12/10/2014   Obesity, Class III, BMI 40-49.9 (morbid obesity) ([place]) 11/21/2014   Hypothyroidism 11/21/2014   HTN (hypertension) 11/21/2014   Hx of papillary thyroid cancer 11/21/2014    Allergies: No Known Allergies Medications:  Current Outpatient Medications:    doxycycline (VIBRA-TABS) 100 MG tablet, Take 1 tablet (100 mg total) by mouth 2 (two) times daily., Disp: 20 tablet, Rfl: 0   predniSONE (DELTASONE) 20 MG tablet, Take 2 tablets (40 mg total) by mouth daily with breakfast., Disp: 10 tablet, Rfl: 0   promethazine-dextromethorphan (PROMETHAZINE-DM) 6.25-15 MG/5ML syrup, Take 5 mLs by mouth 4 (four) times daily as needed for cough., Disp: 118 mL, Rfl: 0   amLODipine (NORVASC) 5 MG tablet, Take 1 tablet (5 mg total) by mouth daily., Disp: 90 tablet, Rfl: 1   Cholecalciferol (VITAMIN D3) 125 MCG (5000 UT) CAPS, Take 5,000 Units by mouth daily., Disp: , Rfl:    Coenzyme Q10 200 MG capsule, Take 200 mg by mouth daily., Disp: , Rfl:    fluticasone (FLONASE) 50 MCG/ACT nasal spray, Place 2 sprays into both nostrils 2 (two) times daily., Disp: , Rfl:    hydrochlorothiazide (MICROZIDE) 12.5 MG capsule, Take 1 capsule (12.5 mg total) by mouth daily., Disp: 90 capsule, Rfl: 1   levothyroxine (EUTHYROX) 175 MCG tablet, Take 1 tablet (175 mcg total) by mouth daily before breakfast., Disp: 90 tablet, Rfl: 1   magnesium oxide (MAG-OX) 400 MG tablet, Take 400 mg by mouth daily., Disp: , Rfl:    Menaquinone-7 (VITAMIN K2) 100 MCG CAPS, Take 100 mcg by mouth daily., Disp: , Rfl:    metFORMIN (GLUCOPHAGE) 500 MG tablet, TAKE 1 TABLET BY MOUTH TWICE DAILY WITH A MEAL, Disp: 180 tablet, Rfl: 0   metoprolol succinate (TOPROL-XL) 100 MG 24 hr tablet, Take 1 tablet (100 mg total) by mouth 2 (two) times  daily. Take with or immediately following a meal., Disp: 180 tablet, Rfl: 1   milk thistle 175 MG tablet, Take 175 mg by mouth daily., Disp: , Rfl:    ONETOUCH VERIO test strip, 1 each daily., Disp: , Rfl:    oxymetazoline (AFRIN) 0.05 % nasal spray, Place 1 spray into both nostrils 2 (two) times daily as needed for congestion., Disp: , Rfl:    sitaGLIPtin (JANUVIA) 25 MG tablet, , Disp: , Rfl:    valsartan (DIOVAN) 160 MG tablet, Take 1 tablet (160 mg total) by mouth daily., Disp: 90 tablet, Rfl: 1   zinc gluconate 50 MG tablet, Take 1 tablet by mouth daily., Disp: , Rfl:   Observations/Objective: Patient is well-developed, well-nourished in no acute distress.  Resting comfortably at home.  Head is normocephalic, atraumatic.  No labored breathing. Speech is clear and coherent with logical content.  Patient is alert and oriented at baseline.   Assessment and Plan: 1. Acute bacterial bronchitis - predniSONE (DELTASONE) 20 MG tablet; Take 2 tablets (40 mg total) by mouth daily with breakfast.  Dispense: 10 tablet; Refill: 0 - doxycycline (VIBRA-TABS) 100  MG tablet; Take 1 tablet (100 mg total) by mouth 2 (two) times daily.  Dispense: 20 tablet; Refill: 0 - promethazine-dextromethorphan (PROMETHAZINE-DM) 6.25-15 MG/5ML syrup; Take 5 mLs by mouth 4 (four) times daily as needed for cough.  Dispense: 118 mL; Refill: 0  Rx Doxycycline.  Increase fluids.  Rest.  Saline nasal spray.  Probiotic.  Mucinex as directed.  Humidifier in bedroom. Start prednisone and promethazine-dm per orders.  Call or return to clinic if symptoms are not improving.   Follow Up Instructions: I discussed the assessment and treatment plan with the patient. The patient was provided an opportunity to ask questions and all were answered. The patient agreed with the plan and demonstrated an understanding of the instructions.  A copy of instructions were sent to the patient via MyChart unless otherwise noted below.   The  patient was advised to call back or seek an in-person evaluation if the symptoms worsen or if the condition fails to improve as anticipated.  Time:  I spent 10 minutes with the patient via telehealth technology discussing the above problems/concerns.    Miranda Rio, PA-C

## 2022-01-08 NOTE — Patient Instructions (Signed)
[name], thank you for joining Leeanne Rio, PA-C for today's virtual visit.  While this provider is not your primary care provider (PCP), if your PCP is located in our provider database this encounter information will be shared with them immediately following your visit.   [facility] account gives you access to today's visit and all your visits, tests, and labs performed at [facility] " click here if you don't have a [facility] account or go to [email or web address]  Consent: (Patient) Tinsleigh Slovacek provided verbal consent for this virtual visit at the beginning of the encounter.  Current Medications:  Current Outpatient Medications:    amLODipine (NORVASC) 5 MG tablet, Take 1 tablet (5 mg total) by mouth daily., Disp: 90 tablet, Rfl: 1   Cholecalciferol (VITAMIN D3) 125 MCG (5000 UT) CAPS, Take 5,000 Units by mouth daily., Disp: , Rfl:    Coenzyme Q10 200 MG capsule, Take 200 mg by mouth daily., Disp: , Rfl:    fluticasone (FLONASE) 50 MCG/ACT nasal spray, Place 2 sprays into both nostrils 2 (two) times daily., Disp: , Rfl:    hydrochlorothiazide (MICROZIDE) 12.5 MG capsule, Take 1 capsule (12.5 mg total) by mouth daily., Disp: 90 capsule, Rfl: 1   levothyroxine (EUTHYROX) 175 MCG tablet, Take 1 tablet (175 mcg total) by mouth daily before breakfast., Disp: 90 tablet, Rfl: 1   magnesium oxide (MAG-OX) 400 MG tablet, Take 400 mg by mouth daily., Disp: , Rfl:    Menaquinone-7 (VITAMIN K2) 100 MCG CAPS, Take 100 mcg by mouth daily., Disp: , Rfl:    metFORMIN (GLUCOPHAGE) 500 MG tablet, TAKE 1 TABLET BY MOUTH TWICE DAILY WITH A MEAL, Disp: 180 tablet, Rfl: 0   metoprolol succinate (TOPROL-XL) 100 MG 24 hr tablet, Take 1 tablet (100 mg total) by mouth 2 (two) times daily. Take with or immediately following a meal., Disp: 180 tablet, Rfl: 1   milk thistle 175 MG tablet, Take 175 mg by mouth daily., Disp: , Rfl:    ONETOUCH VERIO test strip, 1  each daily., Disp: , Rfl:    oxymetazoline (AFRIN) 0.05 % nasal spray, Place 1 spray into both nostrils 2 (two) times daily as needed for congestion., Disp: , Rfl:    sitaGLIPtin (JANUVIA) 25 MG tablet, , Disp: , Rfl:    valsartan (DIOVAN) 160 MG tablet, Take 1 tablet (160 mg total) by mouth daily., Disp: 90 tablet, Rfl: 1   zinc gluconate 50 MG tablet, Take 1 tablet by mouth daily., Disp: , Rfl:    Medications ordered in this encounter:  No orders of the defined types were placed in this encounter.    *If you need refills on other medications prior to your next appointment, please contact your pharmacy*  Follow-Up: Call back or seek an in-person evaluation if the symptoms worsen or if the condition fails to improve as anticipated.  [place] [phone]  Other Instructions Take antibiotic (Doxycycline) as directed.  Increase fluids.  Get plenty of rest. Use Mucinex for congestion. Promethazine-DM for cough and steroid per orders. Take a daily probiotic (I recommend Align or Culturelle, but even Activia Yogurt may be beneficial).  A humidifier placed in the bedroom may offer some relief for a dry, scratchy throat of nasal irritation.  Read information below on acute bronchitis. Please call or return to clinic if symptoms are not improving.  Acute Bronchitis Bronchitis is when the airways that extend from the windpipe into the lungs get red,  puffy, and painful (inflamed). Bronchitis often causes thick spit (mucus) to develop. This leads to a cough. A cough is the most common symptom of bronchitis. In acute bronchitis, the condition usually begins suddenly and goes away over time (usually in 2 weeks). Smoking, allergies, and asthma can make bronchitis worse. Repeated episodes of bronchitis may cause more lung problems.  HOME CARE Rest. Drink enough fluids to keep your pee (urine) clear or pale yellow (unless you need to limit fluids as told by your doctor). Only take  over-the-counter or prescription medicines as told by your doctor. Avoid smoking and secondhand smoke. These can make bronchitis worse. If you are a smoker, think about using nicotine gum or skin patches. Quitting smoking will help your lungs heal faster. Reduce the chance of getting bronchitis again by: Washing your hands often. Avoiding people with cold symptoms. Trying not to touch your hands to your mouth, nose, or eyes. Follow up with your doctor as told.  GET HELP IF: Your symptoms do not improve after 1 week of treatment. Symptoms include: Cough. Fever. Coughing up thick spit. Body aches. Chest congestion. Chills. Shortness of breath. Sore throat.  GET HELP RIGHT AWAY IF:  You have an increased fever. You have chills. You have severe shortness of breath. You have bloody thick spit (sputum). You throw up (vomit) often. You lose too much body fluid (dehydration). You have a severe headache. You faint.  MAKE SURE YOU:  Understand these instructions. Will watch your condition. Will get help right away if you are not doing well or get worse. Document Released: 07/29/2007 Document Revised: 10/12/2012 Document Reviewed: 08/02/2012 [facility] Patient Information 2015 [place], [place]. This information is not intended to replace advice given to you by your health care provider. Make sure you discuss any questions you have with your health care provider.    If you have been instructed to have an in-person evaluation today at a local Urgent Care facility, please use the link below. It will take you to a list of all of our available [facility], including address, phone number and hours of operation. Please do not delay care.  [facility]  If you or a family member do not have a primary care provider, use the link below to schedule a visit and establish care. When you choose a [facility] primary care physician or advanced practice provider, you gain a  long-term partner in health. Find a Primary Care Provider  Learn more about [facility]'s in-office and virtual care options: [facility]

## 2022-01-09 ENCOUNTER — Other Ambulatory Visit: Payer: Self-pay

## 2022-01-09 ENCOUNTER — Encounter: Payer: Self-pay | Admitting: Internal Medicine

## 2022-01-09 DIAGNOSIS — K7460 Unspecified cirrhosis of liver: Secondary | ICD-10-CM

## 2022-01-14 ENCOUNTER — Encounter (HOSPITAL_BASED_OUTPATIENT_CLINIC_OR_DEPARTMENT_OTHER): Payer: Self-pay | Admitting: Emergency Medicine

## 2022-01-14 ENCOUNTER — Telehealth: Payer: BC Managed Care – PPO | Admitting: Physician Assistant

## 2022-01-14 ENCOUNTER — Emergency Department (HOSPITAL_BASED_OUTPATIENT_CLINIC_OR_DEPARTMENT_OTHER): Payer: BC Managed Care – PPO

## 2022-01-14 ENCOUNTER — Emergency Department (HOSPITAL_BASED_OUTPATIENT_CLINIC_OR_DEPARTMENT_OTHER)
Admission: EM | Admit: 2022-01-14 | Discharge: 2022-01-14 | Disposition: A | Discharge status: Home / Self Care | Payer: BC Managed Care – PPO | Attending: Emergency Medicine | Admitting: Emergency Medicine

## 2022-01-14 ENCOUNTER — Other Ambulatory Visit: Payer: Self-pay

## 2022-01-14 DIAGNOSIS — J069 Acute upper respiratory infection, unspecified: Secondary | ICD-10-CM | POA: Insufficient documentation

## 2022-01-14 DIAGNOSIS — B9689 Other specified bacterial agents as the cause of diseases classified elsewhere: Secondary | ICD-10-CM

## 2022-01-14 DIAGNOSIS — J0100 Acute maxillary sinusitis, unspecified: Secondary | ICD-10-CM | POA: Insufficient documentation

## 2022-01-14 DIAGNOSIS — R059 Cough, unspecified: Secondary | ICD-10-CM | POA: Diagnosis not present

## 2022-01-14 DIAGNOSIS — B9789 Other viral agents as the cause of diseases classified elsewhere: Secondary | ICD-10-CM | POA: Diagnosis not present

## 2022-01-14 DIAGNOSIS — Z20822 Contact with and (suspected) exposure to covid-19: Secondary | ICD-10-CM | POA: Insufficient documentation

## 2022-01-14 LAB — RESP PANEL BY RT-PCR (FLU A&B, COVID) ARPGX2
Influenza A by PCR: NEGATIVE
Influenza B by PCR: NEGATIVE
SARS Coronavirus 2 by RT PCR: NEGATIVE

## 2022-01-14 MED ORDER — AMOXICILLIN-POT CLAVULANATE 875-125 MG PO TABS: 1.0000 | ORAL_TABLET | Freq: Two times a day (BID) | ORAL | 0 refills | Status: DC

## 2022-01-14 MED ORDER — BENZONATATE 100 MG PO CAPS: 100.0000 mg | ORAL_CAPSULE | Freq: Three times a day (TID) | ORAL | 0 refills | Status: DC

## 2022-01-14 NOTE — ED Triage Notes (Signed)
Dry cough since 11/12. Started abx and steroids on 11/14 prescribed by UC. Sx unrelieved. Also endorses sinus congestion.

## 2022-01-14 NOTE — Patient Instructions (Signed)
[name], thank you for joining [name], PA-C for today's virtual visit.  While this provider is not your primary care provider (PCP), if your PCP is located in our provider database this encounter information will be shared with them immediately following your visit.   [place] account gives you access to today's visit and all your visits, tests, and labs performed at [facility] " click here if you don't have a [facility] account or go to [email or web address]  Consent: (Patient) Miranda Delacruz provided verbal consent for this virtual visit at the beginning of the encounter.  Current Medications:  Current Outpatient Medications:    amLODipine (NORVASC) 5 MG tablet, Take 1 tablet (5 mg total) by mouth daily., Disp: 90 tablet, Rfl: 1   Cholecalciferol (VITAMIN D3) 125 MCG (5000 UT) CAPS, Take 5,000 Units by mouth daily., Disp: , Rfl:    Coenzyme Q10 200 MG capsule, Take 200 mg by mouth daily., Disp: , Rfl:    doxycycline (VIBRA-TABS) 100 MG tablet, Take 1 tablet (100 mg total) by mouth 2 (two) times daily., Disp: 20 tablet, Rfl: 0   fluticasone (FLONASE) 50 MCG/ACT nasal spray, Place 2 sprays into both nostrils 2 (two) times daily., Disp: , Rfl:    hydrochlorothiazide (MICROZIDE) 12.5 MG capsule, Take 1 capsule (12.5 mg total) by mouth daily., Disp: 90 capsule, Rfl: 1   levothyroxine (EUTHYROX) 175 MCG tablet, Take 1 tablet (175 mcg total) by mouth daily before breakfast., Disp: 90 tablet, Rfl: 1   magnesium oxide (MAG-OX) 400 MG tablet, Take 400 mg by mouth daily., Disp: , Rfl:    Menaquinone-7 (VITAMIN K2) 100 MCG CAPS, Take 100 mcg by mouth daily., Disp: , Rfl:    metFORMIN (GLUCOPHAGE) 500 MG tablet, TAKE 1 TABLET BY MOUTH TWICE DAILY WITH A MEAL, Disp: 180 tablet, Rfl: 0   metoprolol succinate (TOPROL-XL) 100 MG 24 hr tablet, Take 1 tablet (100 mg total) by mouth 2 (two) times daily. Take with or immediately following a meal., Disp:  180 tablet, Rfl: 1   milk thistle 175 MG tablet, Take 175 mg by mouth daily., Disp: , Rfl:    ONETOUCH VERIO test strip, 1 each daily., Disp: , Rfl:    oxymetazoline (AFRIN) 0.05 % nasal spray, Place 1 spray into both nostrils 2 (two) times daily as needed for congestion., Disp: , Rfl:    predniSONE (DELTASONE) 20 MG tablet, Take 2 tablets (40 mg total) by mouth daily with breakfast., Disp: 10 tablet, Rfl: 0   promethazine-dextromethorphan (PROMETHAZINE-DM) 6.25-15 MG/5ML syrup, Take 5 mLs by mouth 4 (four) times daily as needed for cough., Disp: 118 mL, Rfl: 0   sitaGLIPtin (JANUVIA) 25 MG tablet, , Disp: , Rfl:    valsartan (DIOVAN) 160 MG tablet, Take 1 tablet (160 mg total) by mouth daily., Disp: 90 tablet, Rfl: 1   zinc gluconate 50 MG tablet, Take 1 tablet by mouth daily., Disp: , Rfl:    Medications ordered in this encounter:  No orders of the defined types were placed in this encounter.    *If you need refills on other medications prior to your next appointment, please contact your pharmacy*  Follow-Up: Call back or seek an in-person evaluation if the symptoms worsen or if the condition fails to improve as anticipated.  [place] [phone]  Other Instructions Seek in person evaluation at local Urgent Care   If you have been instructed to have an in-person evaluation today at a  local Urgent Care facility, please use the link below. It will take you to a list of all of our available [facility], including address, phone number and hours of operation. Please do not delay care.  [facility]  If you or a family member do not have a primary care provider, use the link below to schedule a visit and establish care. When you choose a [facility] primary care physician or advanced practice provider, you gain a long-term partner in health. Find a Primary Care Provider  Learn more about [facility]'s in-office and virtual care options: [facility]

## 2022-01-14 NOTE — Progress Notes (Signed)
Virtual Visit Consent   Miranda Delacruz, you are scheduled for a virtual visit with a [facility] provider today. Just as with appointments in the office, your consent must be obtained to participate. Your consent will be active for this visit and any virtual visit you may have with one of our providers in the next 365 days. If you have a MyChart account, a copy of this consent can be sent to you electronically.  As this is a virtual visit, video technology does not allow for your provider to perform a traditional examination. This may limit your provider's ability to fully assess your condition. If your provider identifies any concerns that need to be evaluated in person or the need to arrange testing (such as labs, EKG, etc.), we will make arrangements to do so. Although advances in technology are sophisticated, we cannot ensure that it will always work on either your end or our end. If the connection with a video visit is poor, the visit may have to be switched to a telephone visit. With either a video or telephone visit, we are not always able to ensure that we have a secure connection.  By engaging in this virtual visit, you consent to the provision of healthcare and authorize for your insurance to be billed (if applicable) for the services provided during this visit. Depending on your insurance coverage, you may receive a charge related to this service.  I need to obtain your verbal consent now. Are you willing to proceed with your visit today? Miranda Delacruz has provided verbal consent on 01/14/2022 for a virtual visit (video or telephone). [name], PA-C  Date: 01/14/2022 1:31 PM  Virtual Visit via Video Note   IMar Daring, connected with  Miranda Delacruz  ([id number], [birth year]) on 01/14/22 at  1:30 PM EST by a video-enabled telemedicine application and verified that I am speaking with the correct person using two identifiers.  Location: Patient: Virtual Visit Location  Patient: Home Provider: Virtual Visit Location Provider: Home Office   I discussed the limitations of evaluation and management by telemedicine and the availability of in person appointments. The patient expressed understanding and agreed to proceed.    History of Present Illness: Miranda Delacruz is a 61 y.o. who identifies as a female who was assigned female at birth, and is being seen today for continued bronchitis symptoms.  HPI: Cough This is a new problem. Episode onset: treated on 01/08/22, virtually, with Doxycycline (still taking-has 2 days left) and prednisone 43m x 5 days. The problem has been unchanged. The problem occurs constantly. The cough is Productive of sputum and productive of purulent sputum. Associated symptoms include chest pain (tightness), headaches and myalgias. She has tried oral steroids (antibiotics) for the symptoms. The treatment provided no relief.     Problems:  Patient Active Problem List   Diagnosis Date Noted   Acute cholecystitis 09/18/2021   Calculus of gallbladder without cholecystitis without obstruction 03/18/2021   Elevated liver function tests 03/18/2021   Acute hip pain, left 01/29/2021   Muscle pain 01/29/2021   Acute pain of right knee 05/17/2020   Consumes a vegan diet- currently, for now 09/29/2018   Statin declined 05/26/2018   Type 2 diabetes mellitus with hypertriglyceridemia ([facility] 04/22/2018   Type 2 diabetes mellitus without complication, without long-term current use of insulin ([facility] 12/16/2017   Hypertension associated with diabetes ([facility] 12/16/2017   Mixed diabetic hyperlipidemia associated with type 2 diabetes mellitus ([facility] 12/16/2017   Microalbuminuria due  to type 2 diabetes mellitus ([place]) 12/16/2017   Dysuria 02/08/2017   Abnormal urinalysis 02/08/2017   Counseling on health promotion and disease prevention 07/31/2016   Cervical cancer screening 07/31/2016   Screening for breast cancer 07/31/2016   Low serum HDL 10/24/2015    Menopause present 09/25/2015   Migraine headache with aura 02/11/2015   Special screening for malignant neoplasms, colon    Benign neoplasm of ascending colon    Benign neoplasm of transverse colon    Benign neoplasm of sigmoid colon    Hepatic steatosis 12/18/2014   Elevated liver enzymes- from fatty liver disease. 12/10/2014   Hyperlipidemia 12/10/2014   Vitamin D deficiency 12/10/2014   Obesity, Class III, BMI 40-49.9 (morbid obesity) ([place]) 11/21/2014   Hypothyroidism 11/21/2014   HTN (hypertension) 11/21/2014   Hx of papillary thyroid cancer 11/21/2014    Allergies: No Known Allergies Medications:  Current Outpatient Medications:    amLODipine (NORVASC) 5 MG tablet, Take 1 tablet (5 mg total) by mouth daily., Disp: 90 tablet, Rfl: 1   Cholecalciferol (VITAMIN D3) 125 MCG (5000 UT) CAPS, Take 5,000 Units by mouth daily., Disp: , Rfl:    Coenzyme Q10 200 MG capsule, Take 200 mg by mouth daily., Disp: , Rfl:    doxycycline (VIBRA-TABS) 100 MG tablet, Take 1 tablet (100 mg total) by mouth 2 (two) times daily., Disp: 20 tablet, Rfl: 0   fluticasone (FLONASE) 50 MCG/ACT nasal spray, Place 2 sprays into both nostrils 2 (two) times daily., Disp: , Rfl:    hydrochlorothiazide (MICROZIDE) 12.5 MG capsule, Take 1 capsule (12.5 mg total) by mouth daily., Disp: 90 capsule, Rfl: 1   levothyroxine (EUTHYROX) 175 MCG tablet, Take 1 tablet (175 mcg total) by mouth daily before breakfast., Disp: 90 tablet, Rfl: 1   magnesium oxide (MAG-OX) 400 MG tablet, Take 400 mg by mouth daily., Disp: , Rfl:    Menaquinone-7 (VITAMIN K2) 100 MCG CAPS, Take 100 mcg by mouth daily., Disp: , Rfl:    metFORMIN (GLUCOPHAGE) 500 MG tablet, TAKE 1 TABLET BY MOUTH TWICE DAILY WITH A MEAL, Disp: 180 tablet, Rfl: 0   metoprolol succinate (TOPROL-XL) 100 MG 24 hr tablet, Take 1 tablet (100 mg total) by mouth 2 (two) times daily. Take with or immediately following a meal., Disp: 180 tablet, Rfl: 1   milk thistle 175 MG  tablet, Take 175 mg by mouth daily., Disp: , Rfl:    ONETOUCH VERIO test strip, 1 each daily., Disp: , Rfl:    oxymetazoline (AFRIN) 0.05 % nasal spray, Place 1 spray into both nostrils 2 (two) times daily as needed for congestion., Disp: , Rfl:    predniSONE (DELTASONE) 20 MG tablet, Take 2 tablets (40 mg total) by mouth daily with breakfast., Disp: 10 tablet, Rfl: 0   promethazine-dextromethorphan (PROMETHAZINE-DM) 6.25-15 MG/5ML syrup, Take 5 mLs by mouth 4 (four) times daily as needed for cough., Disp: 118 mL, Rfl: 0   sitaGLIPtin (JANUVIA) 25 MG tablet, , Disp: , Rfl:    valsartan (DIOVAN) 160 MG tablet, Take 1 tablet (160 mg total) by mouth daily., Disp: 90 tablet, Rfl: 1   zinc gluconate 50 MG tablet, Take 1 tablet by mouth daily., Disp: , Rfl:   Observations/Objective: Patient is well-developed, well-nourished in no acute distress.  Resting comfortably at home.  Head is normocephalic, atraumatic.  No labored breathing.  Speech is clear and coherent with logical content.  Patient is alert and oriented at baseline.    Assessment and Plan: 1.  Acute bacterial bronchitis  - Continued symptoms with no improvement since starting Doxycycline and Prednisone 8 days ago - Advised to seek in person evaluation to r/o pneumonia  Follow Up Instructions: I discussed the assessment and treatment plan with the patient. The patient was provided an opportunity to ask questions and all were answered. The patient agreed with the plan and demonstrated an understanding of the instructions.  A copy of instructions were sent to the patient via MyChart unless otherwise noted below.    The patient was advised to call back or seek an in-person evaluation if the symptoms worsen or if the condition fails to improve as anticipated.  Time:  I spent 6 minutes with the patient via telehealth technology discussing the above problems/concerns.    [name], PA-C

## 2022-01-14 NOTE — ED Provider Notes (Signed)
[facility] EMERGENCY DEPARTMENT Provider Note   CSN: [id number] Arrival date & time: 01/14/22  1404     History  Chief Complaint  Patient presents with   Cough    Miranda Delacruz is a 61 y.o. female.  61 yo F with a chief complaints of cough.  This been going on for about a week.  She has been on a course of doxycycline and prednisone without significant change.  She has had now 2 virtual visits for this.  She called them back today and they suggested she come to the ED for a chest x-ray.  She has a nonproductive cough.  No fevers or chills.  Feels some pressure in her sinuses and inner ears.  A bit of a sore throat.   Cough      Home Medications Prior to Admission medications   Medication Sig Start Date End Date Taking? Authorizing Provider  amoxicillin-clavulanate (AUGMENTIN) 875-125 MG tablet Take 1 tablet by mouth every 12 (twelve) hours. 01/14/22  Yes Deno Etienne, DO  benzonatate (TESSALON) 100 MG capsule Take 1 capsule (100 mg total) by mouth every 8 (eight) hours. 01/14/22  Yes Deno Etienne, DO  amLODipine (NORVASC) 5 MG tablet Take 1 tablet (5 mg total) by mouth daily. 08/18/21   Ronnell Freshwater, NP  Cholecalciferol (VITAMIN D3) 125 MCG (5000 UT) CAPS Take 5,000 Units by mouth daily. 09/26/18   [provider]  Coenzyme Q10 200 MG capsule Take 200 mg by mouth daily. 05/24/16   [provider]  doxycycline (VIBRA-TABS) 100 MG tablet Take 1 tablet (100 mg total) by mouth 2 (two) times daily. 01/08/22   Brunetta Jeans, PA-C  fluticasone (FLONASE) 50 MCG/ACT nasal spray Place 2 sprays into both nostrils 2 (two) times daily.    [provider]  hydrochlorothiazide (MICROZIDE) 12.5 MG capsule Take 1 capsule (12.5 mg total) by mouth daily. 08/18/21   Ronnell Freshwater, NP  levothyroxine (EUTHYROX) 175 MCG tablet Take 1 tablet (175 mcg total) by mouth daily before breakfast. 11/18/21   Ronnell Freshwater, NP  magnesium oxide (MAG-OX) 400 MG tablet  Take 400 mg by mouth daily.    [provider]  Menaquinone-7 (VITAMIN K2) 100 MCG CAPS Take 100 mcg by mouth daily. 09/26/18   [provider]  metFORMIN (GLUCOPHAGE) 500 MG tablet TAKE 1 TABLET BY MOUTH TWICE DAILY WITH A MEAL 01/05/22   Ronnell Freshwater, NP  metoprolol succinate (TOPROL-XL) 100 MG 24 hr tablet Take 1 tablet (100 mg total) by mouth 2 (two) times daily. Take with or immediately following a meal. 08/18/21   Boscia, Greer Ee, NP  milk thistle 175 MG tablet Take 175 mg by mouth daily.    [provider]  [facility] VERIO test strip 1 each daily. 09/21/21   [provider]  oxymetazoline (AFRIN) 0.05 % nasal spray Place 1 spray into both nostrils 2 (two) times daily as needed for congestion.    [provider]  predniSONE (DELTASONE) 20 MG tablet Take 2 tablets (40 mg total) by mouth daily with breakfast. 01/08/22   Brunetta Jeans, PA-C  promethazine-dextromethorphan (PROMETHAZINE-DM) 6.25-15 MG/5ML syrup Take 5 mLs by mouth 4 (four) times daily as needed for cough. 01/08/22   Brunetta Jeans, PA-C  sitaGLIPtin (JANUVIA) 25 MG tablet  10/29/20   [provider]  valsartan (DIOVAN) 160 MG tablet Take 1 tablet (160 mg total) by mouth daily. 08/18/21   Ronnell Freshwater, NP  zinc  gluconate 50 MG tablet Take 1 tablet by mouth daily.    [provider]      Allergies    Patient has no known allergies.    Review of Systems   Review of Systems  Respiratory:  Positive for cough.     Physical Exam Updated Vital Signs BP (!) 154/102 (BP Location: Left Arm)   Pulse 79   Temp 97.7 F (36.5 C)   Resp 20   Ht 5' 7.5" (1.715 m)   Wt 115.7 kg   SpO2 100%   BMI 39.35 kg/m  Physical Exam Vitals and nursing note reviewed.  Constitutional:      General: She is not in acute distress.    Appearance: She is well-developed. She is not diaphoretic.  HENT:     Head: Normocephalic and atraumatic.     Comments: Swollen  turbinates, posterior nasal drip, tm normal bilaterally.  Sinus tenderness percussion worse over the right maxillary sinus.  Eyes:     Pupils: Pupils are equal, round, and reactive to light.  Cardiovascular:     Rate and Rhythm: Normal rate and regular rhythm.     Heart sounds: No murmur heard.    No friction rub. No gallop.  Pulmonary:     Effort: Pulmonary effort is normal.     Breath sounds: No wheezing or rales.  Abdominal:     General: There is no distension.     Palpations: Abdomen is soft.     Tenderness: There is no abdominal tenderness.  Musculoskeletal:        General: No tenderness.     Cervical back: Normal range of motion and neck supple.  Skin:    General: Skin is warm and dry.  Neurological:     Mental Status: She is alert and oriented to person, place, and time.  Psychiatric:        Behavior: Behavior normal.     ED Results / Procedures / Treatments   Labs (all labs ordered are listed, but only abnormal results are displayed) Labs Reviewed  RESP PANEL BY RT-PCR (FLU A&B, COVID) [id number]    EKG None  Radiology DG Chest Port 1 View  Result Date: 01/14/2022 CLINICAL DATA:  Cough. EXAM: PORTABLE CHEST 1 VIEW COMPARISON:  None Available. FINDINGS: The heart is mildly enlarged. Lungs are clear without evidence of focal consolidation or large pleural effusion. No acute osseous abnormality. IMPRESSION: Mild cardiomegaly. No acute cardiopulmonary process. Electronically Signed   By: Keane Police D.O.   On: 01/14/2022 14:33    Procedures Procedures    Medications Ordered in ED Medications - No data to display  ED Course/ Medical Decision Making/ A&P                           Medical Decision Making Amount and/or Complexity of Data Reviewed Radiology: ordered.  Risk Prescription drug management.   60 yo F with cough congestion going on for just about 8 days now.  Has been seen virtually twice for this called back with continued symptoms and suggested to  come to the ED for evaluation.  No obvious adventitious lung sounds.  Signs of an upper respiratory illness.  Does have some signs and symptoms consistent with sinusitis.  As the patient does not feel she is improving and feels like she is having some worsening sinus congestion would consider a round of antibiotics though I discussed with her that most likely she has  a viral syndrome and she is not improving because antibiotics do not improve those symptoms.  Chest x-ray independently interpreted by me with likely atelectasis in bilateral bases.  No obvious focal infiltrate.  2:37 PM:  I have discussed the diagnosis/risks/treatment options with the patient and family.  Evaluation and diagnostic testing in the emergency department does not suggest an emergent condition requiring admission or immediate intervention beyond what has been performed at this time.  They will follow up with PCP. We also discussed returning to the ED immediately if new or worsening sx occur. We discussed the sx which are most concerning (e.g., sudden worsening pain, fever, inability to tolerate by mouth) that necessitate immediate return. Medications administered to the patient during their visit and any new prescriptions provided to the patient are listed below.  Medications given during this visit Medications - No data to display   The patient appears reasonably screen and/or stabilized for discharge and I doubt any other medical condition or other [facility] requiring further screening, evaluation, or treatment in the ED at this time prior to discharge.          Final Clinical Impression(s) / ED Diagnoses Final diagnoses:  Viral URI with cough  Acute maxillary sinusitis, recurrence not specified    Rx / DC Orders ED Discharge Orders          Ordered    amoxicillin-clavulanate (AUGMENTIN) 875-125 MG tablet  Every 12 hours        01/14/22 1434    benzonatate (TESSALON) 100 MG capsule  Every 8 hours        01/14/22  [place], [place], DO 01/14/22 1437

## 2022-01-14 NOTE — Discharge Instructions (Addendum)
I would have you stop taking the doxycycline and switch to the Augmentin.  Again most likely your symptoms are viral and so antibiotics might not provide much benefit.  I did prescribe you some cough medicine you could try though typically the cough medicines do not tend to completely stop your coughing.  Please follow-up with your family doctor in the office.  Please return for confusion difficulty breathing.

## 2022-01-20 ENCOUNTER — Telehealth: Payer: BC Managed Care – PPO | Admitting: Physician Assistant

## 2022-01-20 DIAGNOSIS — J329 Chronic sinusitis, unspecified: Secondary | ICD-10-CM

## 2022-01-20 NOTE — Progress Notes (Signed)
Because of continued symptoms despite multiple rounds of antibiotics, I feel your condition warrants further evaluation and I recommend that you be seen in a face to face visit. You need a repeat examination and potential imaging of the sinuses to see why this is not resolving. I recommend first contacting your PCP for in-person evaluation. If unable to be seen, please be seen at local urgent care.    NOTE: There will be NO CHARGE for this eVisit   If you are having a true medical emergency please call 911.      For an urgent face to face visit, [facility] has seven urgent care centers for your convenience:     [facility] [place] [facility] [place] Get Driving Directions [phone] [address] [place], [place] [address]    [facility]) Get Driving Directions [phone] [place], [place] [address]  [facility] [place] ([place]) Get Driving Directions [phone] [address] [place],  [place]  [address]  [facility] - at [address] Get Driving Directions  [phone] [address] [place],  [place] [address]   [facility] Get Driving Directions [phone] [address] [place], [place], [place] [address]   [facility] Get Driving Directions  [phone] [address] [place], [place] [address]   [facility] [place] Get Driving Directions [phone] [address] [place], [place] [address]  Your MyChart E-visit questionnaire answers were reviewed by a board certified advanced clinical practitioner to complete your personal care plan based on your specific symptoms.  Thank you for using e-Visits.

## 2022-01-21 ENCOUNTER — Ambulatory Visit: Payer: Self-pay

## 2022-01-26 ENCOUNTER — Ambulatory Visit: Payer: BC Managed Care – PPO

## 2022-01-30 ENCOUNTER — Ambulatory Visit: Payer: BC Managed Care – PPO | Admitting: Nurse Practitioner

## 2022-02-05 ENCOUNTER — Other Ambulatory Visit: Payer: Self-pay

## 2022-02-05 DIAGNOSIS — E119 Type 2 diabetes mellitus without complications: Secondary | ICD-10-CM

## 2022-02-05 DIAGNOSIS — E039 Hypothyroidism, unspecified: Secondary | ICD-10-CM

## 2022-02-05 DIAGNOSIS — E782 Mixed hyperlipidemia: Secondary | ICD-10-CM

## 2022-02-05 DIAGNOSIS — E1159 Type 2 diabetes mellitus with other circulatory complications: Secondary | ICD-10-CM

## 2022-02-10 ENCOUNTER — Other Ambulatory Visit: Payer: BC Managed Care – PPO

## 2022-02-10 DIAGNOSIS — K760 Fatty (change of) liver, not elsewhere classified: Secondary | ICD-10-CM

## 2022-02-10 DIAGNOSIS — E039 Hypothyroidism, unspecified: Secondary | ICD-10-CM | POA: Diagnosis not present

## 2022-02-10 DIAGNOSIS — Z1231 Encounter for screening mammogram for malignant neoplasm of breast: Secondary | ICD-10-CM

## 2022-02-11 LAB — COMPREHENSIVE METABOLIC PANEL
ALT: 69 IU/L — ABNORMAL HIGH (ref 0–32)
AST: 45 IU/L — ABNORMAL HIGH (ref 0–40)
Albumin/Globulin Ratio: 2.4 — ABNORMAL HIGH (ref 1.2–2.2)
Albumin: 4.6 g/dL (ref 3.8–4.9)
Alkaline Phosphatase: 72 IU/L (ref 44–121)
BUN/Creatinine Ratio: 14 (ref 9–23)
BUN: 10 mg/dL (ref 6–24)
Bilirubin Total: 0.9 mg/dL (ref 0.0–1.2)
CO2: 23 mmol/L (ref 20–29)
Calcium: 9.7 mg/dL (ref 8.7–10.2)
Chloride: 94 mmol/L — ABNORMAL LOW (ref 96–106)
Creatinine, Ser: 0.73 mg/dL (ref 0.57–1.00)
Globulin, Total: 1.9 g/dL (ref 1.5–4.5)
Glucose: 309 mg/dL — ABNORMAL HIGH (ref 70–99)
Potassium: 4.4 mmol/L (ref 3.5–5.2)
Sodium: 135 mmol/L (ref 134–144)
Total Protein: 6.5 g/dL (ref 6.0–8.5)
eGFR: 95 mL/min/{1.73_m2} (ref >59)

## 2022-02-11 LAB — TSH+FREE T4
Free T4: 1.44 ng/dL (ref 0.82–1.77)
TSH: 3.26 u[IU]/mL (ref 0.450–4.500)

## 2022-02-11 LAB — LIPID PANEL
Chol/HDL Ratio: 7.2 ratio — ABNORMAL HIGH (ref 0.0–4.4)
Cholesterol, Total: 209 mg/dL — ABNORMAL HIGH (ref 100–199)
HDL: 29 mg/dL — ABNORMAL LOW (ref >39)
LDL Chol Calc (NIH): 65 mg/dL (ref 0–99)
Triglycerides: 751 mg/dL (ref 0–149)
VLDL Cholesterol Cal: 115 mg/dL — ABNORMAL HIGH (ref 5–40)

## 2022-02-11 NOTE — Progress Notes (Signed)
Significant elevation of triglycerides since last check. Discuss with patient at visit 02/17/2022. Thyroid is normal but blood sugars elevated from last check.  ?was she fasting?

## 2022-02-16 NOTE — Progress Notes (Unsigned)
Established patient visit   Patient: Miranda Delacruz   DOB: 01/22/1961   61 y.o. Female  MRN: [id number] Visit Date: 02/17/2022   No chief complaint on file.  Subjective    HPI  Follow up  -diabetes -  --HgbA1c due to be checked.  --check urine microalbumin  --history  of htn - generally well controlled.  -recently drawn labs  --normal thyroid --elevated flucose --elevated liver functions  --severe elevation of triglycerides. Mild elevation of LDL and total  cholesterol  Elevated Chol/HDL ratio is elevated at 7.2    Medications: Outpatient Medications Prior to Visit  Medication Sig   amLODipine (NORVASC) 5 MG tablet Take 1 tablet (5 mg total) by mouth daily.   amoxicillin-clavulanate (AUGMENTIN) 875-125 MG tablet Take 1 tablet by mouth every 12 (twelve) hours.   benzonatate (TESSALON) 100 MG capsule Take 1 capsule (100 mg total) by mouth every 8 (eight) hours.   Cholecalciferol (VITAMIN D3) 125 MCG (5000 UT) CAPS Take 5,000 Units by mouth daily.   Coenzyme Q10 200 MG capsule Take 200 mg by mouth daily.   doxycycline (VIBRA-TABS) 100 MG tablet Take 1 tablet (100 mg total) by mouth 2 (two) times daily.   fluticasone (FLONASE) 50 MCG/ACT nasal spray Place 2 sprays into both nostrils 2 (two) times daily.   hydrochlorothiazide (MICROZIDE) 12.5 MG capsule Take 1 capsule (12.5 mg total) by mouth daily.   levothyroxine (EUTHYROX) 175 MCG tablet Take 1 tablet (175 mcg total) by mouth daily before breakfast.   magnesium oxide (MAG-OX) 400 MG tablet Take 400 mg by mouth daily.   Menaquinone-7 (VITAMIN K2) 100 MCG CAPS Take 100 mcg by mouth daily.   metFORMIN (GLUCOPHAGE) 500 MG tablet TAKE 1 TABLET BY MOUTH TWICE DAILY WITH A MEAL   metoprolol succinate (TOPROL-XL) 100 MG 24 hr tablet Take 1 tablet (100 mg total) by mouth 2 (two) times daily. Take with or immediately following a meal.   milk thistle 175 MG tablet Take 175 mg by mouth daily.   ONETOUCH VERIO test strip 1 each daily.    oxymetazoline (AFRIN) 0.05 % nasal spray Place 1 spray into both nostrils 2 (two) times daily as needed for congestion.   predniSONE (DELTASONE) 20 MG tablet Take 2 tablets (40 mg total) by mouth daily with breakfast.   promethazine-dextromethorphan (PROMETHAZINE-DM) 6.25-15 MG/5ML syrup Take 5 mLs by mouth 4 (four) times daily as needed for cough.   sitaGLIPtin (JANUVIA) 25 MG tablet    valsartan (DIOVAN) 160 MG tablet Take 1 tablet (160 mg total) by mouth daily.   zinc gluconate 50 MG tablet Take 1 tablet by mouth daily.   No facility-administered medications prior to visit.    Review of Systems  {Labs (Optional):23779}   Objective    There were no vitals filed for this visit. There is no height or weight on file to calculate BMI.  BP Readings from Last 3 Encounters:  01/14/22 (Abnormal) 158/96  01/06/22 127/77  11/18/21 124/74    Wt Readings from Last 3 Encounters:  01/14/22 255 lb (115.7 kg)  11/18/21 260 lb 12.8 oz (118.3 kg)  09/19/21 254 lb 3.1 oz (115.3 kg)    Physical Exam  ***  No results found for any visits on 02/17/22.  Assessment & Plan     Problem List Items Addressed This Visit   None    No follow-ups on file.         Ronnell Freshwater, NP  [facility]  [place] [phone] (phone) [phone] (fax)  [place]

## 2022-02-17 ENCOUNTER — Encounter: Payer: Self-pay | Admitting: Nurse Practitioner

## 2022-02-17 ENCOUNTER — Ambulatory Visit (INDEPENDENT_AMBULATORY_CARE_PROVIDER_SITE_OTHER): Payer: BC Managed Care – PPO | Admitting: Nurse Practitioner

## 2022-02-17 VITALS — BP 126/82 | HR 68 | Resp 18 | Ht 66.93 in | Wt 261.0 lb

## 2022-02-17 DIAGNOSIS — Z6841 Body Mass Index (BMI) 40.0 and over, adult: Secondary | ICD-10-CM

## 2022-02-17 DIAGNOSIS — E119 Type 2 diabetes mellitus without complications: Secondary | ICD-10-CM | POA: Diagnosis not present

## 2022-02-17 DIAGNOSIS — Z5320 Procedure and treatment not carried out because of patient's decision for unspecified reasons: Secondary | ICD-10-CM

## 2022-02-17 DIAGNOSIS — I152 Hypertension secondary to endocrine disorders: Secondary | ICD-10-CM

## 2022-02-17 DIAGNOSIS — E1159 Type 2 diabetes mellitus with other circulatory complications: Secondary | ICD-10-CM

## 2022-02-17 DIAGNOSIS — E781 Pure hyperglyceridemia: Secondary | ICD-10-CM | POA: Diagnosis not present

## 2022-02-17 DIAGNOSIS — E039 Hypothyroidism, unspecified: Secondary | ICD-10-CM

## 2022-02-17 LAB — POCT GLYCOSYLATED HEMOGLOBIN (HGB A1C): HbA1c POC (<> result, manual entry): 8.2 % (ref 4.0–5.6)

## 2022-02-17 LAB — POCT UA - MICROALBUMIN
Creatinine, POC: 50 mg/dL
Microalbumin Ur, POC: 30 mg/L

## 2022-02-17 MED ORDER — FENOFIBRATE 48 MG PO TABS: 48.0000 mg | ORAL_TABLET | Freq: Every day | ORAL | 1 refills | Status: DC

## 2022-02-17 MED ORDER — OZEMPIC (0.25 OR 0.5 MG/DOSE) 2 MG/3ML ~~LOC~~ SOPN: PEN_INJECTOR | SUBCUTANEOUS | 2 refills | Status: DC

## 2022-02-19 ENCOUNTER — Ambulatory Visit
Admission: RE | Admit: 2022-02-19 | Discharge: 2022-02-19 | Disposition: A | Discharge status: Home / Self Care | Payer: BC Managed Care – PPO | Source: Ambulatory Visit | Attending: Internal Medicine | Admitting: Internal Medicine

## 2022-02-19 DIAGNOSIS — K7460 Unspecified cirrhosis of liver: Secondary | ICD-10-CM

## 2022-02-19 DIAGNOSIS — K7689 Other specified diseases of liver: Secondary | ICD-10-CM | POA: Diagnosis not present

## 2022-02-19 MED ORDER — GADOPICLENOL 0.5 MMOL/ML IV SOLN
10.0000 mL | Freq: Once | INTRAVENOUS | Status: AC | PRN
  Administered 2022-02-19: 10 mL via INTRAVENOUS

## 2022-02-26 ENCOUNTER — Other Ambulatory Visit: Payer: Self-pay

## 2022-02-26 DIAGNOSIS — K7460 Unspecified cirrhosis of liver: Secondary | ICD-10-CM

## 2022-03-23 ENCOUNTER — Encounter: Payer: Self-pay | Admitting: *Deleted

## 2022-04-04 ENCOUNTER — Other Ambulatory Visit: Payer: Self-pay | Admitting: Nurse Practitioner

## 2022-04-04 DIAGNOSIS — E119 Type 2 diabetes mellitus without complications: Secondary | ICD-10-CM

## 2022-04-06 NOTE — Telephone Encounter (Signed)
L.O.V: 11/18/21  N.O.V: 05/19/22  L.R.F: 08/18/21 Januvia 90 tab 1 refill

## 2022-04-07 ENCOUNTER — Other Ambulatory Visit: Payer: Self-pay | Admitting: Nurse Practitioner

## 2022-04-07 DIAGNOSIS — E1159 Type 2 diabetes mellitus with other circulatory complications: Secondary | ICD-10-CM

## 2022-04-16 ENCOUNTER — Ambulatory Visit: Payer: BC Managed Care – PPO | Admitting: Internal Medicine

## 2022-04-20 ENCOUNTER — Other Ambulatory Visit: Payer: Self-pay | Admitting: Nurse Practitioner

## 2022-04-20 DIAGNOSIS — E119 Type 2 diabetes mellitus without complications: Secondary | ICD-10-CM

## 2022-04-25 ENCOUNTER — Encounter: Payer: Self-pay | Admitting: Nurse Practitioner

## 2022-04-27 ENCOUNTER — Other Ambulatory Visit: Payer: Self-pay | Admitting: Nurse Practitioner

## 2022-04-27 DIAGNOSIS — E119 Type 2 diabetes mellitus without complications: Secondary | ICD-10-CM

## 2022-04-27 DIAGNOSIS — E781 Pure hyperglyceridemia: Secondary | ICD-10-CM

## 2022-05-05 ENCOUNTER — Other Ambulatory Visit: Payer: Self-pay | Admitting: Nurse Practitioner

## 2022-05-05 DIAGNOSIS — E119 Type 2 diabetes mellitus without complications: Secondary | ICD-10-CM

## 2022-05-12 ENCOUNTER — Other Ambulatory Visit: Payer: BC Managed Care – PPO

## 2022-05-12 DIAGNOSIS — E119 Type 2 diabetes mellitus without complications: Secondary | ICD-10-CM | POA: Diagnosis not present

## 2022-05-12 DIAGNOSIS — E781 Pure hyperglyceridemia: Secondary | ICD-10-CM | POA: Diagnosis not present

## 2022-05-13 LAB — COMPREHENSIVE METABOLIC PANEL
ALT: 64 IU/L — ABNORMAL HIGH (ref 0–32)
AST: 39 IU/L (ref 0–40)
Albumin/Globulin Ratio: 2.3 — ABNORMAL HIGH (ref 1.2–2.2)
Albumin: 4.6 g/dL (ref 3.8–4.9)
Alkaline Phosphatase: 69 IU/L (ref 44–121)
BUN/Creatinine Ratio: 16 (ref 9–23)
BUN: 12 mg/dL (ref 6–24)
Bilirubin Total: 0.7 mg/dL (ref 0.0–1.2)
CO2: 20 mmol/L (ref 20–29)
Calcium: 9.6 mg/dL (ref 8.7–10.2)
Chloride: 99 mmol/L (ref 96–106)
Creatinine, Ser: 0.73 mg/dL (ref 0.57–1.00)
Globulin, Total: 2 g/dL (ref 1.5–4.5)
Glucose: 220 mg/dL — ABNORMAL HIGH (ref 70–99)
Potassium: 4.3 mmol/L (ref 3.5–5.2)
Sodium: 136 mmol/L (ref 134–144)
Total Protein: 6.6 g/dL (ref 6.0–8.5)
eGFR: 95 mL/min/{1.73_m2} (ref >59)

## 2022-05-13 LAB — LIPID PANEL
Chol/HDL Ratio: 7.3 ratio — ABNORMAL HIGH (ref 0.0–4.4)
Cholesterol, Total: 219 mg/dL — ABNORMAL HIGH (ref 100–199)
HDL: 30 mg/dL — ABNORMAL LOW (ref >39)
LDL Chol Calc (NIH): 128 mg/dL — ABNORMAL HIGH (ref 0–99)
Triglycerides: 339 mg/dL — ABNORMAL HIGH (ref 0–149)
VLDL Cholesterol Cal: 61 mg/dL — ABNORMAL HIGH (ref 5–40)

## 2022-05-13 LAB — C-PEPTIDE: C-Peptide: 7.2 ng/mL — ABNORMAL HIGH (ref 1.1–4.4)

## 2022-05-19 ENCOUNTER — Ambulatory Visit: Payer: BC Managed Care – PPO | Admitting: Nurse Practitioner

## 2022-05-19 ENCOUNTER — Encounter: Payer: Self-pay | Admitting: Nurse Practitioner

## 2022-05-19 VITALS — BP 113/73 | HR 63 | Ht 66.93 in | Wt 267.4 lb

## 2022-05-19 DIAGNOSIS — E119 Type 2 diabetes mellitus without complications: Secondary | ICD-10-CM | POA: Diagnosis not present

## 2022-05-19 DIAGNOSIS — E039 Hypothyroidism, unspecified: Secondary | ICD-10-CM

## 2022-05-19 DIAGNOSIS — I152 Hypertension secondary to endocrine disorders: Secondary | ICD-10-CM | POA: Diagnosis not present

## 2022-05-19 DIAGNOSIS — E1159 Type 2 diabetes mellitus with other circulatory complications: Secondary | ICD-10-CM

## 2022-05-19 MED ORDER — TIRZEPATIDE 2.5 MG/0.5ML ~~LOC~~ SOAJ: 2.5000 mg | SUBCUTANEOUS | 1 refills | Status: DC

## 2022-05-19 MED ORDER — METFORMIN HCL 500 MG PO TABS: 1000.0000 mg | ORAL_TABLET | Freq: Two times a day (BID) | ORAL | 2 refills | Status: DC

## 2022-05-19 NOTE — Progress Notes (Signed)
Established patient visit   Patient: Miranda Delacruz   DOB: 1960/05/03   62 y.o. Female  MRN: [id number] Visit Date: 05/19/2022   Chief Complaint  Patient presents with   Medical Management of Chronic Issues   Subjective    HPI  Diabetes Mellitus: Patient presents for follow up of type 2 diabetes. Symptoms: none. Symptoms have been well-controlled. Patient denies none.  Home sugars: BGs range between 100 and 200 . Medications metformin. No longer on ozempic or mounjaro as insurance would not cover it.  Patient is tolerating medications.   Lab Results  Component Value Date   HGBA1C 7.3 (A) 06/24/2022    Lab Results  Component Value Date   LABMICR <3.0 01/23/2019   MICROALBUR 30 02/17/2022   MICROALBUR 10 03/05/2020   Has started working out with weights. Does this for about an hour, three days per week.  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 This is being watched per her gastro doctor.     She has no new concerns or complaints today. -She denies chest pain, chest pressure, or shortness of breath. She denies headaches or visual disturbances. She denies abdominal pain, nausea, vomiting, or changes in bowel or bladder habits.     Medications: Outpatient Medications Prior to Visit  Medication Sig   Cholecalciferol (VITAMIN D3) 125 MCG (5000 UT) CAPS Take 5,000 Units by mouth daily.   Coenzyme Q10 200 MG capsule Take 200 mg by mouth daily.   doxycycline (VIBRA-TABS) 100 MG tablet Take 1 tablet (100 mg total) by mouth 2 (two) times daily.   fenofibrate (TRICOR) 48 MG tablet Take 1 tablet (48 mg total) by mouth daily.   fluticasone (FLONASE) 50 MCG/ACT nasal spray Place 2 sprays into both nostrils 2 (two) times daily.   levothyroxine (EUTHYROX) 175 MCG tablet Take 1 tablet (175 mcg total) by mouth daily before breakfast.   magnesium oxide (MAG-OX) 400 MG tablet Take 400 mg by mouth daily.   Menaquinone-7 (VITAMIN K2) 100 MCG CAPS Take 100 mcg by mouth daily.   milk thistle 175  MG tablet Take 175 mg by mouth daily.   ONETOUCH VERIO test strip 1 each daily.   oxymetazoline (AFRIN) 0.05 % nasal spray Place 1 spray into both nostrils 2 (two) times daily as needed for congestion.   [DISCONTINUED] amLODipine (NORVASC) 5 MG tablet Take 1 tablet (5 mg total) by mouth daily.   [DISCONTINUED] hydrochlorothiazide (MICROZIDE) 12.5 MG capsule Take 1 capsule (12.5 mg total) by mouth daily.   [DISCONTINUED] JANUVIA 25 MG tablet Take 1 tablet by mouth once daily   [DISCONTINUED] metFORMIN (GLUCOPHAGE) 500 MG tablet TAKE 1 TABLET BY MOUTH TWICE DAILY WITH A MEAL   [DISCONTINUED] metoprolol succinate (TOPROL-XL) 100 MG 24 hr tablet TAKE 1 TABLET BY MOUTH TWICE DAILY WITH  OR  IMMEDIATELY  FOLLOWING  A  MEAL   [DISCONTINUED] valsartan (DIOVAN) 160 MG tablet Take 1 tablet (160 mg total) by mouth daily.   [DISCONTINUED] amoxicillin-clavulanate (AUGMENTIN) 875-125 MG tablet Take 1 tablet by mouth every 12 (twelve) hours.   [DISCONTINUED] benzonatate (TESSALON) 100 MG capsule Take 1 capsule (100 mg total) by mouth every 8 (eight) hours.   [DISCONTINUED] predniSONE (DELTASONE) 20 MG tablet Take 2 tablets (40 mg total) by mouth daily with breakfast.   [DISCONTINUED] promethazine-dextromethorphan (PROMETHAZINE-DM) 6.25-15 MG/5ML syrup Take 5 mLs by mouth 4 (four) times daily as needed for cough.   [DISCONTINUED] Semaglutide,0.25 or 0.5MG /DOS, (OZEMPIC, 0.25 OR 0.5 MG/DOSE,) 2 MG/3ML SOPN Inject 0.25 mg  weekly for two weeks then increase to 0.5 mg weekly Dx - E11.65   [DISCONTINUED] zinc gluconate 50 MG tablet Take 1 tablet by mouth daily.   No facility-administered medications prior to visit.    Review of Systems See HPI     Last CBC Lab Results  Component Value Date   WBC 6.7 11/11/2021   HGB 14.3 11/11/2021   HCT 43.1 11/11/2021   MCV 93 11/11/2021   MCH 30.7 11/11/2021   RDW 13.7 11/11/2021   PLT 233 11/11/2021   Last metabolic panel Lab Results  Component Value Date    GLUCOSE 220 (H) 05/12/2022   NA 136 05/12/2022   K 4.3 05/12/2022   CL 99 05/12/2022   CO2 20 05/12/2022   BUN 12 05/12/2022   CREATININE 0.73 05/12/2022   EGFR 95 05/12/2022   CALCIUM 9.6 05/12/2022   PROT 6.6 05/12/2022   ALBUMIN 4.6 05/12/2022   LABGLOB 2.0 05/12/2022   AGRATIO 2.3 (H) 05/12/2022   BILITOT 0.7 05/12/2022   ALKPHOS 69 05/12/2022   AST 39 05/12/2022   ALT 64 (H) 05/12/2022   ANIONGAP 8 09/19/2021   Last lipids Lab Results  Component Value Date   CHOL 219 (H) 05/12/2022   HDL 30 (L) 05/12/2022   LDLCALC 128 (H) 05/12/2022   TRIG 339 (H) 05/12/2022   CHOLHDL 7.3 (H) 05/12/2022   Last hemoglobin A1c Lab Results  Component Value Date   HGBA1C 7.3 (A) 06/24/2022   Last thyroid functions Lab Results  Component Value Date   TSH 3.260 02/10/2022   Last vitamin D Lab Results  Component Value Date   VD25OH 80.3 09/05/2020   Last vitamin B12 and Folate Lab Results  Component Value Date   VITAMINB12 446 05/24/2018       Objective     Today's Vitals   05/19/22 1037  BP: 113/73  Pulse: 63  SpO2: 98%  Weight: 267 lb 6.4 oz (121.3 kg)  Height: 5' 6.93" (1.7 m)   Body mass index is 41.97 kg/m.  BP Readings from Last 3 Encounters:  05/19/22 113/73  02/17/22 126/82  01/14/22 (Abnormal) 158/96    Wt Readings from Last 3 Encounters:  05/19/22 267 lb 6.4 oz (121.3 kg)  02/17/22 261 lb (118.4 kg)  01/14/22 255 lb (115.7 kg)    Physical Exam Vitals and nursing note reviewed.  Constitutional:      Appearance: Normal appearance. She is well-developed.  HENT:     Head: Normocephalic and atraumatic.     Nose: Nose normal.     Mouth/Throat:     Mouth: Mucous membranes are moist.     Pharynx: Oropharynx is clear.  Eyes:     Extraocular Movements: Extraocular movements intact.     Conjunctiva/sclera: Conjunctivae normal.     Pupils: Pupils are equal, round, and reactive to light.  Neck:     Vascular: No carotid bruit.  Cardiovascular:      Rate and Rhythm: Normal rate and regular rhythm.     Pulses: Normal pulses.     Heart sounds: Normal heart sounds.  Pulmonary:     Effort: Pulmonary effort is normal.     Breath sounds: Normal breath sounds.  Abdominal:     Palpations: Abdomen is soft.  Musculoskeletal:        General: Normal range of motion.     Cervical back: Normal range of motion and neck supple.  Lymphadenopathy:     Cervical: No cervical adenopathy.  Skin:  General: Skin is warm and dry.     Capillary Refill: Capillary refill takes less than 2 seconds.  Neurological:     General: No focal deficit present.     Mental Status: She is alert and oriented to person, place, and time.  Psychiatric:        Mood and Affect: Mood normal.        Behavior: Behavior normal.        Thought Content: Thought content normal.        Judgment: Judgment normal.     Results for orders placed or performed in visit on 05/19/22  POCT glycosylated hemoglobin (Hb A1C)  Result Value Ref Range   Hemoglobin A1C 7.3 (A) 4.0 - 5.6 %   HbA1c POC (<> result, manual entry)     HbA1c, POC (prediabetic range)     HbA1c, POC (controlled diabetic range)      Assessment & Plan    Type 2 diabetes mellitus without complication, without long-term current use of insulin (HCC) Assessment & Plan: HgbA1c 7.3 today.  -Change Ozempic to Mounjaro 2.5 mg weekly.  -Continue other diabetic medication as prescribed.  -limit intake of CHO and sugar.  -recheck HgbA1c in 3 months   Orders: -     POCT glycosylated hemoglobin (Hb A1C) -     metFORMIN HCl; Take 2 tablets (1,000 mg total) by mouth 2 (two) times daily with a meal.  Dispense: 120 tablet; Refill: 2 -     Tirzepatide; Inject 2.5 mg into the skin once a week.  Dispense: 2 mL; Refill: 1  Hypertension associated with diabetes (HCC) Assessment & Plan: Blood pressure stable. Continue medication as prescribed   Orders: -     POCT glycosylated hemoglobin (Hb A1C)  Acquired  hypothyroidism Assessment & Plan: Thyroid panel stable. Continue levothyroxine as prescribed    Class 3 severe obesity due to excess calories with serious comorbidity and body mass index (BMI) of 40.0 to 44.9 in adult [facility]) Assessment & Plan: Discussed lowering calorie intake to 1500 calories per day and incorporating exercise into daily routine to help lose weight.       Return in about 3 months (around 08/19/2022) for diabetes with HgbA1c check.         Carlean Jews, NP  [facility] at [facility] [phone] (phone) [phone] (fax)  [facility]

## 2022-05-31 ENCOUNTER — Other Ambulatory Visit: Payer: Self-pay | Admitting: Nurse Practitioner

## 2022-05-31 DIAGNOSIS — E119 Type 2 diabetes mellitus without complications: Secondary | ICD-10-CM

## 2022-06-10 ENCOUNTER — Other Ambulatory Visit: Payer: Self-pay | Admitting: Nurse Practitioner

## 2022-06-10 DIAGNOSIS — I152 Hypertension secondary to endocrine disorders: Secondary | ICD-10-CM

## 2022-06-17 ENCOUNTER — Other Ambulatory Visit: Payer: Self-pay | Admitting: Nurse Practitioner

## 2022-06-17 DIAGNOSIS — E1159 Type 2 diabetes mellitus with other circulatory complications: Secondary | ICD-10-CM

## 2022-06-24 LAB — POCT GLYCOSYLATED HEMOGLOBIN (HGB A1C): Hemoglobin A1C: 7.3 % — AB (ref 4.0–5.6)

## 2022-06-24 NOTE — Assessment & Plan Note (Signed)
HgbA1c 7.3 today.  -Change Ozempic to Mounjaro 2.5 mg weekly.  -Continue other diabetic medication as prescribed.  -limit intake of CHO and sugar.  -recheck HgbA1c in 3 months

## 2022-06-24 NOTE — Assessment & Plan Note (Signed)
Thyroid panel stable. Continue levothyroxine as prescribed  

## 2022-06-24 NOTE — Assessment & Plan Note (Signed)
Discussed lowering calorie intake to 1500 calories per day and incorporating exercise into daily routine to help lose weight.  °

## 2022-06-24 NOTE — Assessment & Plan Note (Signed)
Blood pressure stable. Continue medication as prescribed  °

## 2022-06-30 ENCOUNTER — Encounter: Payer: Self-pay | Admitting: Nurse Practitioner

## 2022-08-04 ENCOUNTER — Other Ambulatory Visit: Payer: Self-pay | Admitting: Nurse Practitioner

## 2022-08-04 DIAGNOSIS — Z1231 Encounter for screening mammogram for malignant neoplasm of breast: Secondary | ICD-10-CM

## 2022-08-09 IMAGING — MR MR KNEE*R* W/O CM
6 series · 40 of 40 positions shown · non-contrast
Comparison: Right knee x-rays dated May 17, 2020.

CLINICAL DATA: Right knee pain and swelling with mechanical
symptoms the past 3 months. Fell in [REDACTED].

EXAM:
MRI OF THE RIGHT KNEE WITHOUT CONTRAST
TECHNIQUE: Multiplanar, multisequence MR imaging of the knee was performed. No
intravenous contrast was administered.

[Series 6: T2 fat-sat · axial · right · 4.0mm · 0.50mm/px · z∈[-50,+103]mm · 9 of 36 slices shown (1 of 3)]
[im 1/36]
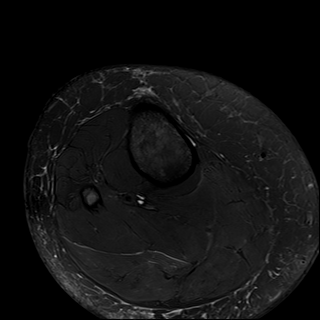
[im 5/36]
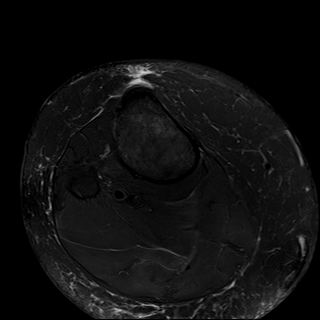
[im 9/36]
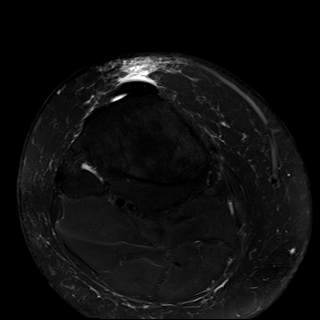
[im 14/36]
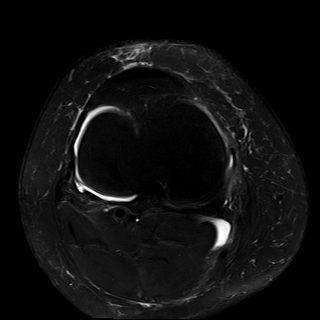
[im 18/36]
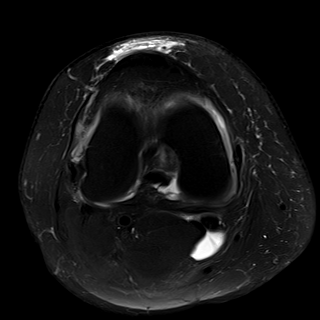
[im 22/36]
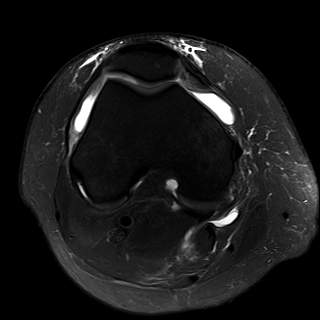
[im 27/36]
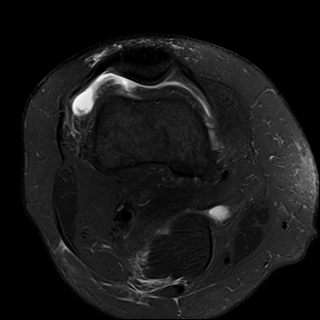
[im 31/36]
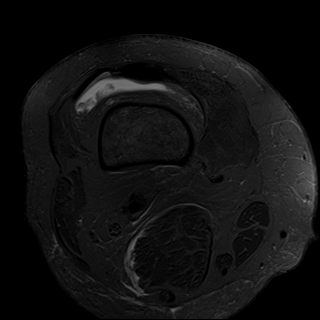
[im 36/36]
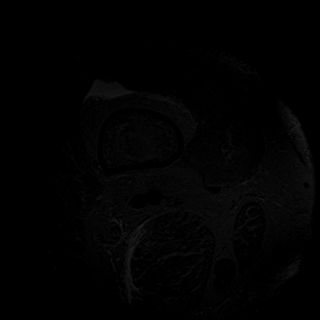

[Series 7: T2 fat-sat · coronal · right · 4.0mm · 0.47mm/px · 6 of 28 slices shown (2 of 3)]
[im 1/28]
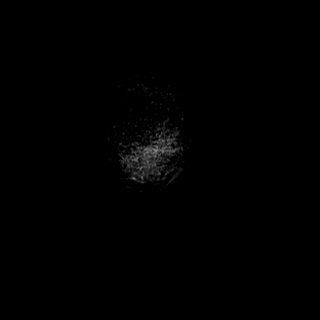
[im 6/28]
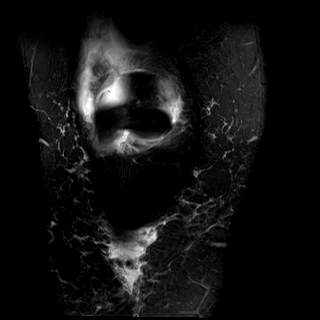
[im 11/28]
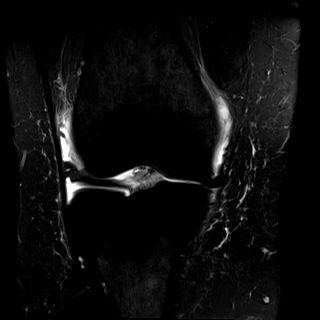
[im 17/28]
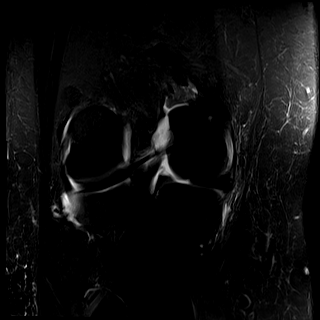
[im 22/28]
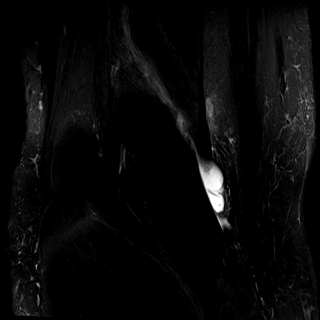
[im 28/28]
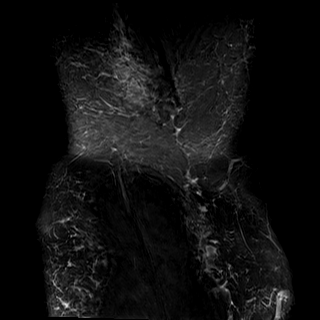

[Series 8: T1 · coronal · right · 4.0mm · 0.47mm/px · 6 of 28 slices shown]
[im 1/28]
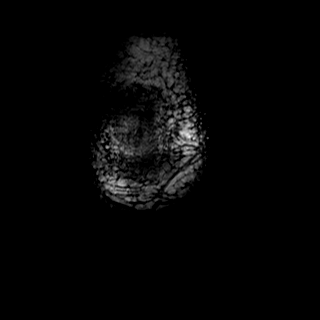
[im 6/28]
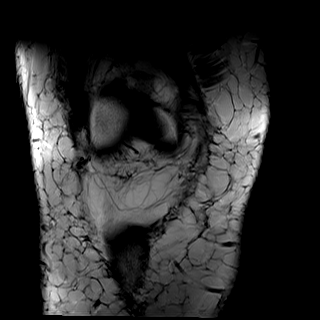
[im 11/28]
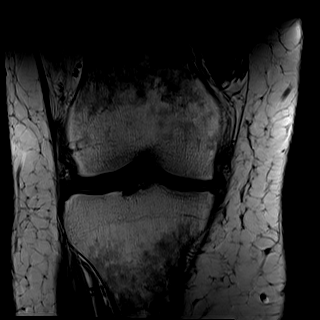
[im 17/28]
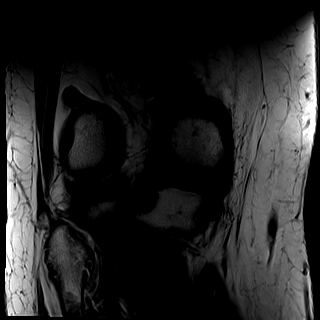
[im 22/28]
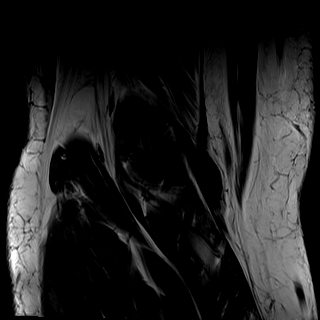
[im 28/28]
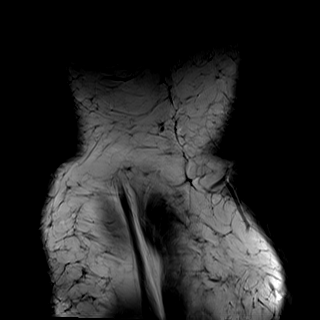

[Series 9: PD fat-sat · coronal · right · 3.0mm · 0.47mm/px · 7 of 32 slices shown (1 of 2)]
[im 1/32]
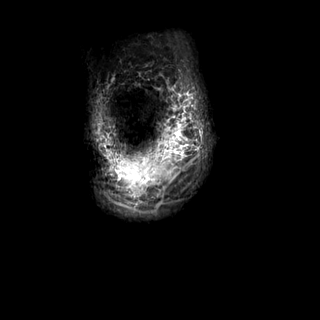
[im 6/32]
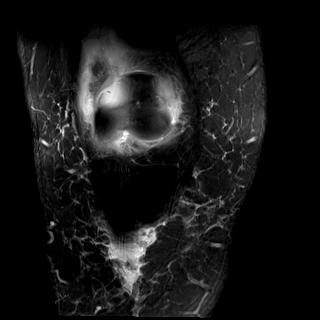
[im 11/32]
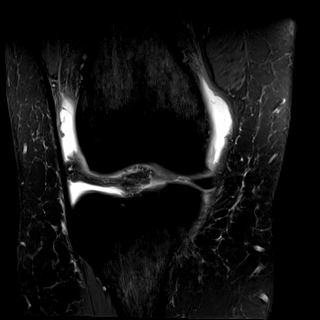
[im 16/32]
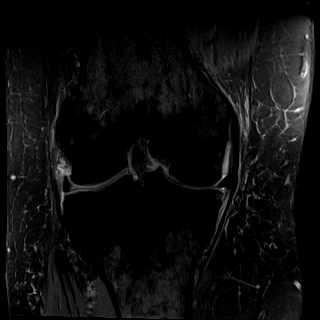
[im 21/32]
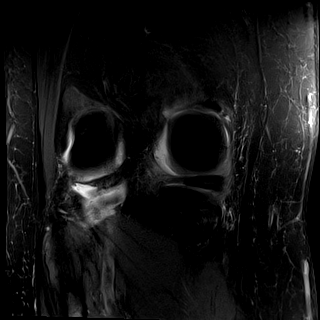
[im 26/32]
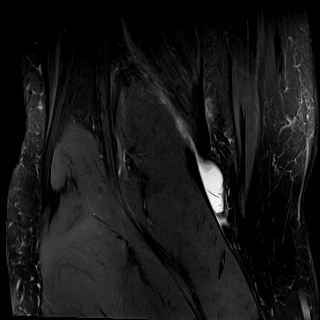
[im 32/32]
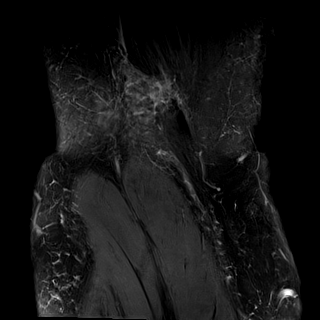

[Series 10: PD fat-sat · sagittal · right · 3.0mm · 0.39mm/px · 6 of 29 slices shown (2 of 2)]
[im 1/29]
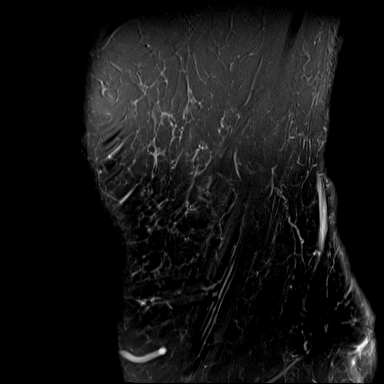
[im 6/29]
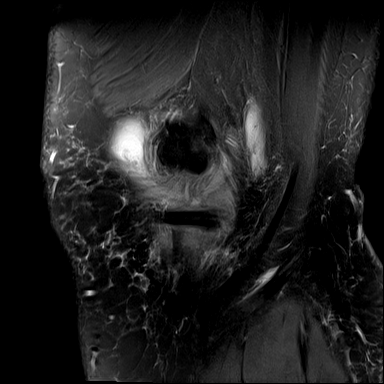
[im 12/29]
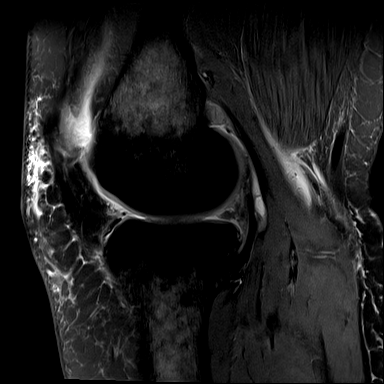
[im 17/29]
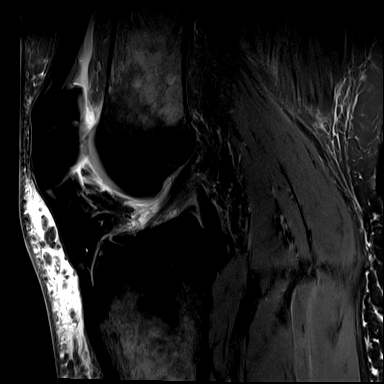
[im 23/29]
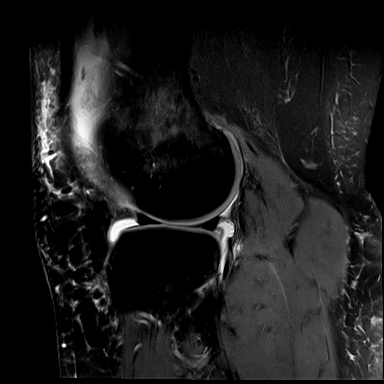
[im 29/29]
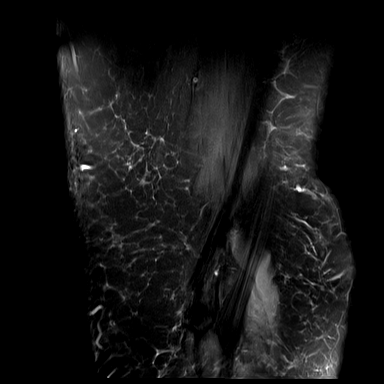

[Series 11: T2 fat-sat · sagittal · right · 3.0mm · 0.39mm/px · 6 of 29 slices shown (3 of 3)]
[im 1/29]
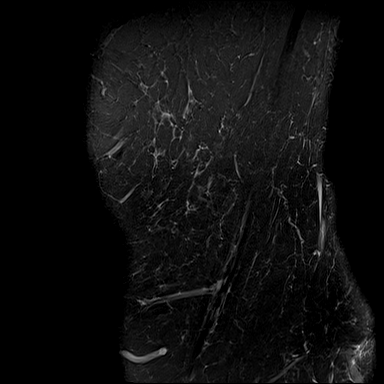
[im 6/29]
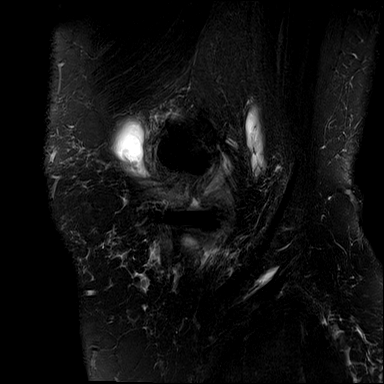
[im 12/29]
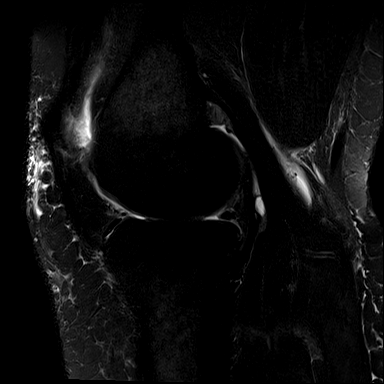
[im 17/29]
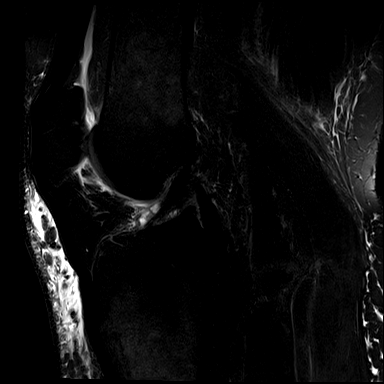
[im 23/29]
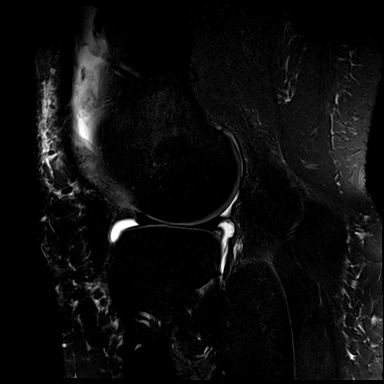
[im 29/29]
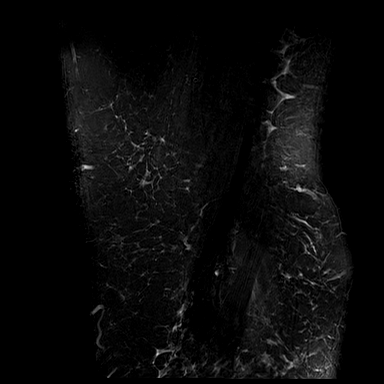

[40 of 40 positions shown; findings below may reference images not displayed]

FINDINGS: MENISCI

Medial meniscus: Partial radial tear of the posterior horn near the
root (series 9, image 13; series 10, image 12). Prominent extrusion
of the body.

Lateral meniscus: Small horizontal tear of the mid body (series 9,
images 17-18).

LIGAMENTS

Cruciates:  Intact ACL and PCL.

Collaterals: Medial collateral ligament is intact. Lateral
collateral ligament complex is intact.

CARTILAGE

Patellofemoral: Mild partial-thickness cartilage loss over the
patellar apex and medial trochlea.

Medial: Diffuse cartilage thinning over the weight-bearing medial
femoral condyle with focal full-thickness cartilage defect.

Lateral: Small focal high-grade partial-thickness cartilage defect
over the posterior lateral tibial plateau.

Joint:  Moderate joint effusion with synovitis.  Normal Hoffa's fat.

Popliteal Fossa: Small to moderate Baker cyst. Intact popliteus
tendon.

Extensor Mechanism: Intact quadriceps tendon and patellar tendon.
Intact medial and lateral patellar retinaculum. Intact MPFL.

Bones: No acute fracture or dislocation. No suspicious bone lesion.
Red marrow reconversion noted in the distal femur and proximal
tibia.

Other: None.
IMPRESSION: 1. Partial radial tear of the medial meniscus posterior horn near
the root with prominent extrusion of the body.
2. Small horizontal tear of the lateral meniscus mid body.
3. Mild tricompartmental osteoarthritis.
4. Moderate joint effusion with synovitis. Small to moderate Baker
cyst.

## 2022-08-15 ENCOUNTER — Other Ambulatory Visit: Payer: Self-pay | Admitting: Nurse Practitioner

## 2022-08-15 DIAGNOSIS — E119 Type 2 diabetes mellitus without complications: Secondary | ICD-10-CM

## 2022-08-17 ENCOUNTER — Encounter: Payer: Self-pay | Admitting: Nurse Practitioner

## 2022-08-17 ENCOUNTER — Ambulatory Visit: Payer: BC Managed Care – PPO | Admitting: Nurse Practitioner

## 2022-08-17 VITALS — BP 124/79 | HR 69 | Ht 66.93 in | Wt 258.8 lb

## 2022-08-17 DIAGNOSIS — E119 Type 2 diabetes mellitus without complications: Secondary | ICD-10-CM | POA: Diagnosis not present

## 2022-08-17 DIAGNOSIS — E039 Hypothyroidism, unspecified: Secondary | ICD-10-CM

## 2022-08-17 DIAGNOSIS — E1159 Type 2 diabetes mellitus with other circulatory complications: Secondary | ICD-10-CM

## 2022-08-17 DIAGNOSIS — I152 Hypertension secondary to endocrine disorders: Secondary | ICD-10-CM

## 2022-08-17 DIAGNOSIS — E781 Pure hyperglyceridemia: Secondary | ICD-10-CM

## 2022-08-17 DIAGNOSIS — E782 Mixed hyperlipidemia: Secondary | ICD-10-CM

## 2022-08-17 DIAGNOSIS — Z7984 Long term (current) use of oral hypoglycemic drugs: Secondary | ICD-10-CM

## 2022-08-17 LAB — POCT GLYCOSYLATED HEMOGLOBIN (HGB A1C): HbA1c POC (<> result, manual entry): 7.2 % (ref 4.0–5.6)

## 2022-08-17 MED ORDER — VALSARTAN 160 MG PO TABS
160.0000 mg | ORAL_TABLET | Freq: Every day | ORAL | 1 refills | Status: DC
Start: 2022-08-17 — End: 2022-11-19

## 2022-08-17 MED ORDER — HYDROCHLOROTHIAZIDE 12.5 MG PO CAPS: 12.5000 mg | ORAL_CAPSULE | Freq: Every day | ORAL | 1 refills | Status: DC

## 2022-08-17 MED ORDER — SITAGLIPTIN PHOSPHATE 50 MG PO TABS
50.0000 mg | ORAL_TABLET | Freq: Every day | ORAL | 1 refills | Status: DC
Start: 2022-08-17 — End: 2022-11-19

## 2022-08-17 MED ORDER — AMLODIPINE BESYLATE 5 MG PO TABS
5.0000 mg | ORAL_TABLET | Freq: Every day | ORAL | 1 refills | Status: DC
Start: 2022-08-17 — End: 2022-11-19

## 2022-08-17 MED ORDER — METOPROLOL SUCCINATE ER 100 MG PO TB24
ORAL_TABLET | ORAL | 1 refills | Status: DC
Start: 2022-08-17 — End: 2022-11-19

## 2022-08-17 MED ORDER — METFORMIN HCL 500 MG PO TABS
1000.0000 mg | ORAL_TABLET | Freq: Two times a day (BID) | ORAL | 1 refills | Status: DC
Start: 2022-08-17 — End: 2022-11-19

## 2022-08-17 NOTE — Progress Notes (Signed)
Established patient visit   Patient: Miranda Delacruz   DOB: May 16, 1960   62 y.o. Female  MRN: [id number] Visit Date: 08/17/2022   Chief Complaint  Patient presents with   Medical Management of Chronic Issues   Subjective    HPI  Follow up  -DM 2  -HgbA1c is 7.2 today  -did try to get mounjaro 2.5 mg weekly, but insurance would not cover this.  -Continue Januvia and metformin daily. HTN -Blood pressure well-controlled. No new concerns or complaints today. -She denies chest pain, chest pressure, or shortness of breath. She denies headaches or visual disturbances. She denies abdominal pain, nausea, vomiting, or changes in bowel or bladder habits.    Medications: Outpatient Medications Prior to Visit  Medication Sig   Cholecalciferol (VITAMIN D3) 125 MCG (5000 UT) CAPS Take 5,000 Units by mouth daily.   Coenzyme Q10 200 MG capsule Take 200 mg by mouth daily.   doxycycline (VIBRA-TABS) 100 MG tablet Take 1 tablet (100 mg total) by mouth 2 (two) times daily.   fluticasone (FLONASE) 50 MCG/ACT nasal spray Place 2 sprays into both nostrils 2 (two) times daily.   levothyroxine (EUTHYROX) 175 MCG tablet Take 1 tablet (175 mcg total) by mouth daily before breakfast.   magnesium oxide (MAG-OX) 400 MG tablet Take 400 mg by mouth daily.   Menaquinone-7 (VITAMIN K2) 100 MCG CAPS Take 100 mcg by mouth daily.   milk thistle 175 MG tablet Take 175 mg by mouth daily.   Omega 3 1200 MG CAPS    ONETOUCH VERIO test strip 1 each daily.   oxymetazoline (AFRIN) 0.05 % nasal spray Place 1 spray into both nostrils 2 (two) times daily as needed for congestion.   [DISCONTINUED] sitaGLIPtin (JANUVIA) 25 MG tablet Take 1 tablet by mouth once daily   [DISCONTINUED] amLODipine (NORVASC) 5 MG tablet Take 1 tablet by mouth once daily   [DISCONTINUED] fenofibrate (TRICOR) 48 MG tablet Take 1 tablet (48 mg total) by mouth daily.   [DISCONTINUED] hydrochlorothiazide (MICROZIDE) 12.5 MG capsule Take 1 capsule by  mouth once daily   [DISCONTINUED] metFORMIN (GLUCOPHAGE) 500 MG tablet Take 2 tablets (1,000 mg total) by mouth 2 (two) times daily with a meal.   [DISCONTINUED] metoprolol succinate (TOPROL-XL) 100 MG 24 hr tablet TAKE 1 TABLET BY MOUTH TWICE DAILY WITH MEALS OR  IMMEDIATELY  FOLLOWING  A  MEAL   [DISCONTINUED] tirzepatide (MOUNJARO) 2.5 MG/0.5ML Pen Inject 2.5 mg into the skin once a week.   [DISCONTINUED] valsartan (DIOVAN) 160 MG tablet Take 1 tablet by mouth once daily   No facility-administered medications prior to visit.    Review of Systems See HPI     Last CBC Lab Results  Component Value Date   WBC 6.7 11/11/2021   HGB 14.3 11/11/2021   HCT 43.1 11/11/2021   MCV 93 11/11/2021   MCH 30.7 11/11/2021   RDW 13.7 11/11/2021   PLT 233 11/11/2021   Last metabolic panel Lab Results  Component Value Date   GLUCOSE 220 (H) 05/12/2022   NA 136 05/12/2022   K 4.3 05/12/2022   CL 99 05/12/2022   CO2 20 05/12/2022   BUN 12 05/12/2022   CREATININE 0.73 05/12/2022   EGFR 95 05/12/2022   CALCIUM 9.6 05/12/2022   PROT 6.6 05/12/2022   ALBUMIN 4.6 05/12/2022   LABGLOB 2.0 05/12/2022   AGRATIO 2.3 (H) 05/12/2022   BILITOT 0.7 05/12/2022   ALKPHOS 69 05/12/2022   AST 39 05/12/2022   ALT 64 (H)  05/12/2022   ANIONGAP 8 09/19/2021   Last lipids Lab Results  Component Value Date   CHOL 219 (H) 05/12/2022   HDL 30 (L) 05/12/2022   LDLCALC 128 (H) 05/12/2022   TRIG 339 (H) 05/12/2022   CHOLHDL 7.3 (H) 05/12/2022   Last hemoglobin A1c Lab Results  Component Value Date   HGBA1C 7.2 08/17/2022   Last thyroid functions Lab Results  Component Value Date   TSH 3.260 02/10/2022   Last vitamin D Lab Results  Component Value Date   VD25OH 80.3 09/05/2020       Objective     Today's Vitals   08/17/22 1011  BP: 124/79  Pulse: 69  SpO2: 94%  Weight: 258 lb 12.8 oz (117.4 kg)  Height: 5' 6.93" (1.7 m)   Body mass index is 40.62 kg/m.  BP Readings from Last 3  Encounters:  08/17/22 124/79  05/19/22 113/73  02/17/22 126/82    Wt Readings from Last 3 Encounters:  08/17/22 258 lb 12.8 oz (117.4 kg)  05/19/22 267 lb 6.4 oz (121.3 kg)  02/17/22 261 lb (118.4 kg)    Physical Exam Vitals and nursing note reviewed.  Constitutional:      Appearance: Normal appearance. She is well-developed. She is obese.  HENT:     Head: Normocephalic and atraumatic.     Nose: Nose normal.     Mouth/Throat:     Mouth: Mucous membranes are moist.     Pharynx: Oropharynx is clear.  Eyes:     Extraocular Movements: Extraocular movements intact.     Conjunctiva/sclera: Conjunctivae normal.     Pupils: Pupils are equal, round, and reactive to light.  Neck:     Vascular: No carotid bruit.  Cardiovascular:     Rate and Rhythm: Normal rate and regular rhythm.     Pulses: Normal pulses.     Heart sounds: Normal heart sounds.  Pulmonary:     Effort: Pulmonary effort is normal.     Breath sounds: Normal breath sounds.  Abdominal:     Palpations: Abdomen is soft.  Musculoskeletal:        General: Normal range of motion.     Cervical back: Normal range of motion and neck supple.  Lymphadenopathy:     Cervical: No cervical adenopathy.  Skin:    General: Skin is warm and dry.     Capillary Refill: Capillary refill takes less than 2 seconds.  Neurological:     General: No focal deficit present.     Mental Status: She is alert and oriented to person, place, and time.  Psychiatric:        Mood and Affect: Mood normal.        Behavior: Behavior normal.        Thought Content: Thought content normal.        Judgment: Judgment normal.     Results for orders placed or performed in visit on 08/17/22  POCT glycosylated hemoglobin (Hb A1C)  Result Value Ref Range   Hemoglobin A1C     HbA1c POC (<> result, manual entry) 7.2 4.0 - 5.6 %   HbA1c, POC (prediabetic range)     HbA1c, POC (controlled diabetic range)      Assessment & Plan    Type 2 diabetes mellitus  without complication, without long-term current use of insulin (HCC) Assessment & Plan: Hemoglobin A1c today of 7.2. Increase Januvia to 50 mg daily. Continue metformin as previously prescribed. Limit intake of carbohydrates and sugar and  increase water intake daily.   Recheck hemoglobin A1c in 3 months.  Orders: -     metFORMIN HCl; Take 2 tablets (1,000 mg total) by mouth 2 (two) times daily with a meal.  Dispense: 180 tablet; Refill: 1 -     SITagliptin Phosphate; Take 1 tablet (50 mg total) by mouth daily.  Dispense: 90 tablet; Refill: 1 -     POCT glycosylated hemoglobin (Hb A1C)  Hypertension associated with diabetes (HCC) Assessment & Plan: Blood pressure stable. Continue medication as prescribed   Orders: -     amLODIPine Besylate; Take 1 tablet (5 mg total) by mouth daily.  Dispense: 90 tablet; Refill: 1 -     hydroCHLOROthiazide; Take 1 capsule (12.5 mg total) by mouth daily.  Dispense: 90 capsule; Refill: 1 -     Metoprolol Succinate ER; TAKE 1 TABLET BY MOUTH TWICE DAILY WITH MEALS OR  IMMEDIATELY  FOLLOWING  A  MEAL  Dispense: 180 tablet; Refill: 1 -     Valsartan; Take 1 tablet (160 mg total) by mouth daily.  Dispense: 90 tablet; Refill: 1 -     POCT glycosylated hemoglobin (Hb A1C)  Acquired hypothyroidism Assessment & Plan: Thyroid panel stable. Continue levothyroxine as prescribed    Mixed hyperlipidemia Assessment & Plan: Recent lipid panel -  Lipid Panel     Component Value Date/Time   CHOL 219 (H) 05/12/2022 0850   TRIG 339 (H) 05/12/2022 0850   HDL 30 (L) 05/12/2022 0850   CHOLHDL 7.3 (H) 05/12/2022 0850   CHOLHDL 7.8 (H) 10/15/2015 0815   VLDL 71 (H) 10/15/2015 0815   LDLCALC 128 (H) 05/12/2022 0850   LABVLDL 61 (H) 05/12/2022 0850  Declines treatment with statin. Recommend she limit intake of fried and fatty foods. She should increase intake of lean proteins and green leafy vegetables. Adding exercise into daily routine will also be beneficial.         Return in about 3 months (around 11/17/2022) for health maintenance exam, FBW a week prior to visit. check TSH and Free t4. vitamin d also .         Carlean Jews, NP  [facility] at [facility] [phone] (phone) [phone] (fax)  [facility]

## 2022-08-19 ENCOUNTER — Ambulatory Visit: Payer: BC Managed Care – PPO | Admitting: Nurse Practitioner

## 2022-08-19 ENCOUNTER — Ambulatory Visit: Payer: BC Managed Care – PPO

## 2022-08-24 NOTE — Assessment & Plan Note (Signed)
Recent lipid panel -  Lipid Panel     Component Value Date/Time   CHOL 219 (H) 05/12/2022 0850   TRIG 339 (H) 05/12/2022 0850   HDL 30 (L) 05/12/2022 0850   CHOLHDL 7.3 (H) 05/12/2022 0850   CHOLHDL 7.8 (H) 10/15/2015 0815   VLDL 71 (H) 10/15/2015 0815   LDLCALC 128 (H) 05/12/2022 0850   LABVLDL 61 (H) 05/12/2022 0850  Declines treatment with statin. Recommend she limit intake of fried and fatty foods. She should increase intake of lean proteins and green leafy vegetables. Adding exercise into daily routine will also be beneficial.

## 2022-08-24 NOTE — Assessment & Plan Note (Signed)
>>  ASSESSMENT AND PLAN FOR HYPERLIPIDEMIA WRITTEN ON 08/24/2022 11:21 AM BY BOSCIA, HEATHER E, NP  Recent lipid panel -  Lipid Panel     Component Value Date/Time   CHOL 219 (H) 05/12/2022 0850   TRIG 339 (H) 05/12/2022 0850   HDL 30 (L) 05/12/2022 0850   CHOLHDL 7.3 (H) 05/12/2022 0850   CHOLHDL 7.8 (H) 10/15/2015 0815   VLDL 71 (H) 10/15/2015 0815   LDLCALC 128 (H) 05/12/2022 0850   LABVLDL 61 (H) 05/12/2022 0850  Declines treatment with statin. Recommend she limit intake of fried and fatty foods. She should increase intake of lean proteins and green leafy vegetables. Adding exercise into daily routine will also be beneficial.

## 2022-08-24 NOTE — Assessment & Plan Note (Signed)
Blood pressure stable. Continue medication as prescribed  °

## 2022-08-24 NOTE — Assessment & Plan Note (Signed)
Thyroid panel stable. Continue levothyroxine as prescribed  

## 2022-08-24 NOTE — Assessment & Plan Note (Signed)
Hemoglobin A1c today of 7.2. Increase Januvia to 50 mg daily. Continue metformin as previously prescribed. Limit intake of carbohydrates and sugar and increase water intake daily.   Recheck hemoglobin A1c in 3 months.

## 2022-08-28 ENCOUNTER — Telehealth: Payer: Self-pay | Admitting: *Deleted

## 2022-08-28 ENCOUNTER — Ambulatory Visit
Admission: RE | Admit: 2022-08-28 | Discharge: 2022-08-28 | Disposition: A | Discharge status: Home / Self Care | Payer: BC Managed Care – PPO | Source: Ambulatory Visit | Attending: Nurse Practitioner | Admitting: Nurse Practitioner

## 2022-08-28 DIAGNOSIS — Z1231 Encounter for screening mammogram for malignant neoplasm of breast: Secondary | ICD-10-CM | POA: Diagnosis not present

## 2022-08-28 NOTE — Telephone Encounter (Signed)
-----   Message from Missy Sabins, RN sent at 08/28/2022  8:23 AM EDT ----- Regarding: FW: MR abd w w/o  ----- Message ----- From: Chrystie Nose, RN Sent: 08/27/2022  12:00 AM EDT To: Chrystie Nose, RN Subject: MR abd w w/o                                   Pt needs repeat MRI 6 mth

## 2022-08-28 NOTE — Telephone Encounter (Signed)
Patient called with no answer. LMTCB.

## 2022-08-31 ENCOUNTER — Telehealth: Payer: Self-pay | Admitting: *Deleted

## 2022-08-31 ENCOUNTER — Encounter: Payer: Self-pay | Admitting: *Deleted

## 2022-08-31 NOTE — Telephone Encounter (Signed)
Called and left message for patient regarding lab draws, MRI ABD, and OV due dates and to call the office for verification. This message x's 3.

## 2022-08-31 NOTE — Telephone Encounter (Signed)
-----   Message from Missy Sabins, RN sent at 08/28/2022  8:23 AM EDT ----- Regarding: FW: MR abd w w/o  ----- Message ----- From: Chrystie Nose, RN Sent: 08/27/2022  12:00 AM EDT To: Chrystie Nose, RN Subject: MR abd w w/o                                   Pt needs repeat MRI 6 mth

## 2022-08-31 NOTE — Telephone Encounter (Signed)
Please see below Patient Message Open 08/31/2022 [facility]   Avanell Shackleton, RN   All Conversations: MRI ABD due, lab draw due, and office visit is also due (Newest Message First) August 31, 2022 Miranda Delacruz  to Me      08/31/22 10:50 AM I cannot afford an MRI at $3000 per. There has to be other testing that can be done. Labs I can do anytime.  Me  to Debbrah Gradillas  JB    08/31/22 10:20 AM Please call the office to discuss and verify MRI ABD imaging that is due since your last MRI 6 months ago, as well as the lab draw. Also need to make appt for an office visit.   Thank you, [facility]  Last read by Salomon Mast at 10:54 AM on 08/31/2022. This encounter is not signed. The conversation may still be ongoing. Additional Documentation  Encounter Info: Billing Info,   History,   Allergies,   Detailed Report   Orders Placed  None Medication Renewals and Changes   None Medication List Visit Diagnoses   None Problem List

## 2022-09-01 NOTE — Telephone Encounter (Signed)
MyChart message sent to patient with recommendations per Dr. Margretta Sidle.

## 2022-09-01 NOTE — Telephone Encounter (Signed)
We could try CT abdomen with and without contrast, hepatic protocol/HCC protocol which would likely be slightly less expensive than MRI Ultrasound is not a viable option given her cirrhosis and the need to reexamine this small lesion which could represent a liver cancer.  Knowing this information helps [place] make treatment decisions if this is in fact [facility]

## 2022-11-05 ENCOUNTER — Other Ambulatory Visit (HOSPITAL_COMMUNITY)
Admission: RE | Admit: 2022-11-05 | Discharge: 2022-11-05 | Disposition: A | Discharge status: Home / Self Care | Payer: BC Managed Care – PPO | Source: Ambulatory Visit | Attending: Obstetrics and Gynecology | Admitting: Obstetrics and Gynecology

## 2022-11-05 ENCOUNTER — Ambulatory Visit (INDEPENDENT_AMBULATORY_CARE_PROVIDER_SITE_OTHER): Payer: BC Managed Care – PPO | Admitting: Obstetrics and Gynecology

## 2022-11-05 ENCOUNTER — Encounter: Payer: Self-pay | Admitting: Obstetrics and Gynecology

## 2022-11-05 VITALS — BP 122/78 | HR 70 | Resp 16 | Ht 66.5 in | Wt 256.0 lb

## 2022-11-05 DIAGNOSIS — N898 Other specified noninflammatory disorders of vagina: Secondary | ICD-10-CM

## 2022-11-05 DIAGNOSIS — D219 Benign neoplasm of connective and other soft tissue, unspecified: Secondary | ICD-10-CM

## 2022-11-05 DIAGNOSIS — Z01419 Encounter for gynecological examination (general) (routine) without abnormal findings: Secondary | ICD-10-CM | POA: Insufficient documentation

## 2022-11-05 LAB — WET PREP FOR TRICH, YEAST, CLUE

## 2022-11-05 NOTE — Progress Notes (Signed)
62 y.o. y.o. female here for annual exam. She denies any pm bleeding.  H/o thyroid cancer HRTdenies Pelvic discharge: denies Pelvic pain: denies  Last mammogram: skips mammograms to every other year, based on family member's recommendations that works in radiology Last colonoscopy: UTD per patient married  Blood pressure 122/78, pulse 70, resp. rate 16, height 5' 6.5" (1.689 m), weight 256 lb (116.1 kg).     Component Value Date/Time   DIAGPAP  08/08/2018 0000    NEGATIVE FOR INTRAEPITHELIAL LESIONS OR MALIGNANCY.   ADEQPAP  08/08/2018 0000    Satisfactory for evaluation  endocervical/transformation zone component PRESENT.  Denies any abnormal pap smears  GYN HISTORY:    Component Value Date/Time   DIAGPAP  08/08/2018 0000    NEGATIVE FOR INTRAEPITHELIAL LESIONS OR MALIGNANCY.   ADEQPAP  08/08/2018 0000    Satisfactory for evaluation  endocervical/transformation zone component PRESENT.    OB History  Gravida Para Term Preterm AB Living  2 2 2     2   SAB IAB Ectopic Multiple Live Births          2    # Outcome Date GA Lbr Len/2nd Weight Sex Type Anes PTL Lv  2 Term           1 Term             Past Medical History:  Diagnosis Date   Adrenal adenoma    Colon polyps    Diabetes mellitus without complication (HCC)    Gallstones    GERD (gastroesophageal reflux disease)    Hepatic steatosis    Hyperlipidemia    Hypertension    Hypothyroidism    Migraines    Thyroid cancer (HCC)    thyroid    Past Surgical History:  Procedure Laterality Date   CHOLECYSTECTOMY N/A 09/19/2021   Procedure: LAPAROSCOPIC CHOLECYSTECTOMY WITH ICG;  Surgeon: Andria Meuse, MD;  Location: [facility];  Service: General;  Laterality: N/A;   COLONOSCOPY  2000   normal   COLONOSCOPY     COLONOSCOPY WITH PROPOFOL N/A 01/22/2015   Procedure: COLONOSCOPY WITH PROPOFOL;  Surgeon: Midge Minium, MD;  Location: [facility];  Service: Endoscopy;  Laterality: N/A;   POLYPECTOMY      THYROIDECTOMY  2009   TONSILLECTOMY AND ADENOIDECTOMY  1976   TUBAL LIGATION  1997    Current Outpatient Medications on File Prior to Visit  Medication Sig Dispense Refill   amLODipine (NORVASC) 5 MG tablet Take 1 tablet (5 mg total) by mouth daily. 90 tablet 1   APPLE CIDER VINEGAR PO      Bacillus Coagulans-Inulin (PROBIOTIC-PREBIOTIC PO) Take by mouth.     Cholecalciferol (VITAMIN D3) 125 MCG (5000 UT) CAPS Take 5,000 Units by mouth daily.     fluticasone (FLONASE) 50 MCG/ACT nasal spray Place 2 sprays into both nostrils 2 (two) times daily.     hydrochlorothiazide (MICROZIDE) 12.5 MG capsule Take 1 capsule (12.5 mg total) by mouth daily. 90 capsule 1   levothyroxine (EUTHYROX) 175 MCG tablet Take 1 tablet (175 mcg total) by mouth daily before breakfast. 90 tablet 1   magnesium oxide (MAG-OX) 400 MG tablet Take 400 mg by mouth daily.     metFORMIN (GLUCOPHAGE) 500 MG tablet Take 2 tablets (1,000 mg total) by mouth 2 (two) times daily with a meal. 180 tablet 1   metoprolol succinate (TOPROL-XL) 100 MG 24 hr tablet TAKE 1 TABLET BY MOUTH TWICE DAILY WITH MEALS OR  IMMEDIATELY  FOLLOWING  A  MEAL 180 tablet 1   milk thistle 175 MG tablet Take 175 mg by mouth daily.     Omega 3 1200 MG CAPS      ONETOUCH VERIO test strip 1 each daily.     oxymetazoline (AFRIN) 0.05 % nasal spray Place 1 spray into both nostrils 2 (two) times daily as needed for congestion.     sitaGLIPtin (JANUVIA) 50 MG tablet Take 1 tablet (50 mg total) by mouth daily. 90 tablet 1   valsartan (DIOVAN) 160 MG tablet Take 1 tablet (160 mg total) by mouth daily. 90 tablet 1   No current facility-administered medications on file prior to visit.    Social History   Socioeconomic History   Marital status: Married    Spouse name: Not on file   Number of children: 2   Years of education: Not on file   Highest education level: Not on file  Occupational History   Not on file  Tobacco Use   Smoking status: Former     Current packs/day: 0.00    Types: Cigarettes    Quit date: 02/24/1987    Years since quitting: 35.7   Smokeless tobacco: Never  Vaping Use   Vaping status: Never Used  Substance and Sexual Activity   Alcohol use: Not Currently   Drug use: No   Sexual activity: Not Currently    Partners: Male    Birth control/protection: Post-menopausal, Surgical    Comment: btl  Other Topics Concern   Not on file  Social History Narrative   Not on file   Social Determinants of Health   Financial Resource Strain: Not on file  Food Insecurity: Not on file  Transportation Needs: Not on file  Physical Activity: Not on file  Stress: Not on file  Social Connections: Not on file  Intimate Partner Violence: Not on file    Family History  Problem Relation Age of Onset   Hypertension Mother    Hypertension Maternal Grandmother    Stroke Maternal Grandmother    Colon polyps Neg Hx    Colon cancer Neg Hx    Esophageal cancer Neg Hx    Rectal cancer Neg Hx    Stomach cancer Neg Hx      No Known Allergies    Patient's last menstrual period was No LMP recorded. Patient is postmenopausal..          Sexually active: yes     Review of Systems Alls systems reviewed and are negative.     PE General appearance: alert, cooperative and appears stated age Head: Normocephalic, without obvious abnormality, atraumatic Neck: no adenopathy, supple, symmetrical, trachea midline and thyroid normal to inspection and palpation Lungs: clear to auscultation bilaterally Breasts: normal appearance, no masses or tenderness Heart: regular rate and rhythm Abdomen: soft, non-tender; bowel sounds normal; no masses,  no organomegaly Extremities: extremities normal, atraumatic, no cyanosis or edema Skin: Skin color, texture, turgor normal. No rashes or lesions Lymph nodes: Cervical, supraclavicular, and axillary nodes normal. No abnormal inguinal nodes palpated Neurologic: Grossly normal     Pelvic: External  genitalia: large atypical mole seen on left vulva near introitus              Urethra:  normal appearing urethra with no masses, tenderness or lesions              Bartholins and Skenes: normal  Vagina: normal appearing vagina with normal color, possible yeast infection, no lesions.               Cervix: no lesions, no cervical motion tenderness               Bimanual Exam:  Uterus:  increased size, irregular contour?fibroids, position, consistency, mobility, non-tender              Adnexa: no mass, fullness, tenderness          Chaperone was present for exam.   A:         Well Woman GYN exam, possible fibroid uterus, atypical vulvar mole                             P:        Pap smear collected             Encouraged annual mammogram screening and encouraged her not to skip and on the importance of the screening                      Labs and immunizations with her primary             Discussed breast self exams             Encouraged safe sexual practices and enouraged healthy lifestyle practices with diet and exercise TV US ordered RTC for punch biopsy of vulvar mole on the left Wet mount negative today Earley Favor

## 2022-11-05 NOTE — Patient Instructions (Signed)
The vaginal ultrasound order has been placed, all you need to do is call the scheduling desk at [phone] to make the appointment at one of our outpatient imaging centers at your earliest convenience. This is to evaluate your uterus and ovaries.  The report will go directly to epic after.   RTC in 1-2 weeks for punch biopsy  Thank you for coming today!  It was so nice seeing you today. Dr. Karma Greaser

## 2022-11-07 ENCOUNTER — Other Ambulatory Visit: Payer: Self-pay | Admitting: Nurse Practitioner

## 2022-11-07 DIAGNOSIS — E039 Hypothyroidism, unspecified: Secondary | ICD-10-CM

## 2022-11-10 ENCOUNTER — Other Ambulatory Visit: Payer: Self-pay | Admitting: Nurse Practitioner

## 2022-11-10 DIAGNOSIS — E039 Hypothyroidism, unspecified: Secondary | ICD-10-CM

## 2022-11-12 ENCOUNTER — Other Ambulatory Visit: Payer: BC Managed Care – PPO

## 2022-11-12 ENCOUNTER — Encounter: Payer: Self-pay | Admitting: Family Medicine

## 2022-11-12 ENCOUNTER — Other Ambulatory Visit: Payer: Self-pay | Admitting: Nurse Practitioner

## 2022-11-12 DIAGNOSIS — E039 Hypothyroidism, unspecified: Secondary | ICD-10-CM

## 2022-11-12 MED ORDER — LEVOTHYROXINE SODIUM 175 MCG PO TABS
175.0000 ug | ORAL_TABLET | Freq: Every day | ORAL | 0 refills | Status: DC
Start: 2022-11-12 — End: 2022-11-19

## 2022-11-13 ENCOUNTER — Other Ambulatory Visit: Payer: BC Managed Care – PPO

## 2022-11-13 DIAGNOSIS — E559 Vitamin D deficiency, unspecified: Secondary | ICD-10-CM | POA: Diagnosis not present

## 2022-11-13 DIAGNOSIS — E039 Hypothyroidism, unspecified: Secondary | ICD-10-CM

## 2022-11-13 DIAGNOSIS — Z01419 Encounter for gynecological examination (general) (routine) without abnormal findings: Secondary | ICD-10-CM

## 2022-11-13 DIAGNOSIS — D219 Benign neoplasm of connective and other soft tissue, unspecified: Secondary | ICD-10-CM

## 2022-11-13 LAB — CYTOLOGY - PAP: Diagnosis: NEGATIVE

## 2022-11-14 LAB — TSH+FREE T4
Free T4: 1.67 ng/dL (ref 0.82–1.77)
TSH: 0.842 u[IU]/mL (ref 0.450–4.500)

## 2022-11-14 LAB — VITAMIN D 25 HYDROXY (VIT D DEFICIENCY, FRACTURES): Vit D, 25-Hydroxy: 53.5 ng/mL (ref 30.0–100.0)

## 2022-11-16 ENCOUNTER — Encounter: Payer: Self-pay | Admitting: Family Medicine

## 2022-11-19 ENCOUNTER — Ambulatory Visit: Payer: BC Managed Care – PPO | Admitting: Family Medicine

## 2022-11-19 ENCOUNTER — Encounter: Payer: Self-pay | Admitting: Family Medicine

## 2022-11-19 VITALS — BP 122/80 | HR 63 | Resp 18 | Ht 66.5 in | Wt 256.0 lb

## 2022-11-19 DIAGNOSIS — Z7984 Long term (current) use of oral hypoglycemic drugs: Secondary | ICD-10-CM

## 2022-11-19 DIAGNOSIS — E119 Type 2 diabetes mellitus without complications: Secondary | ICD-10-CM

## 2022-11-19 DIAGNOSIS — I152 Hypertension secondary to endocrine disorders: Secondary | ICD-10-CM

## 2022-11-19 DIAGNOSIS — E1169 Type 2 diabetes mellitus with other specified complication: Secondary | ICD-10-CM | POA: Diagnosis not present

## 2022-11-19 DIAGNOSIS — E039 Hypothyroidism, unspecified: Secondary | ICD-10-CM

## 2022-11-19 DIAGNOSIS — E782 Mixed hyperlipidemia: Secondary | ICD-10-CM

## 2022-11-19 DIAGNOSIS — Z0000 Encounter for general adult medical examination without abnormal findings: Secondary | ICD-10-CM | POA: Diagnosis not present

## 2022-11-19 DIAGNOSIS — E1159 Type 2 diabetes mellitus with other circulatory complications: Secondary | ICD-10-CM

## 2022-11-19 LAB — POCT GLYCOSYLATED HEMOGLOBIN (HGB A1C): HbA1c POC (<> result, manual entry): 7.2 % (ref 4.0–5.6)

## 2022-11-19 MED ORDER — METOPROLOL SUCCINATE ER 100 MG PO TB24
ORAL_TABLET | ORAL | 1 refills | Status: DC
Start: 2022-11-19 — End: 2023-02-22

## 2022-11-19 MED ORDER — HYDROCHLOROTHIAZIDE 12.5 MG PO CAPS
12.5000 mg | ORAL_CAPSULE | Freq: Every day | ORAL | 1 refills | Status: DC
Start: 2022-11-19 — End: 2023-02-22

## 2022-11-19 MED ORDER — VALSARTAN 160 MG PO TABS
160.0000 mg | ORAL_TABLET | Freq: Every day | ORAL | 1 refills | Status: DC
Start: 2022-11-19 — End: 2023-02-22

## 2022-11-19 MED ORDER — AMLODIPINE BESYLATE 5 MG PO TABS
5.0000 mg | ORAL_TABLET | Freq: Every day | ORAL | 1 refills | Status: DC
Start: 2022-11-19 — End: 2023-02-22

## 2022-11-19 MED ORDER — METFORMIN HCL 500 MG PO TABS
1000.0000 mg | ORAL_TABLET | Freq: Two times a day (BID) | ORAL | 1 refills | Status: DC
Start: 2022-11-19 — End: 2023-02-22

## 2022-11-19 MED ORDER — SITAGLIPTIN PHOSPHATE 50 MG PO TABS
50.0000 mg | ORAL_TABLET | Freq: Every day | ORAL | 1 refills | Status: DC
Start: 2022-11-19 — End: 2023-02-22

## 2022-11-19 MED ORDER — LEVOTHYROXINE SODIUM 175 MCG PO TABS
175.0000 ug | ORAL_TABLET | Freq: Every day | ORAL | 1 refills | Status: DC
Start: 2022-11-19 — End: 2023-02-22

## 2022-11-19 NOTE — Patient Instructions (Addendum)
Once you get your eye exam done, we will get a copy of the results!  Otherwise, you are up-to-date on all of your preventative care :)

## 2022-11-19 NOTE — Assessment & Plan Note (Addendum)
Most recent A1c 7.2, repeated today and is stable at 7.2.  Continue metformin 1000 mg twice daily, sitagliptin 50 mg daily.  Over the next few months, continue making lifestyle interventions a priority and we will recheck A1c.  If still above goal of 7.0, may increase sitagliptin, max 100 mg daily.  Patient planning on updating eye exam in the next few months.  Will complete foot exam at next follow-up visit.

## 2022-11-19 NOTE — Assessment & Plan Note (Signed)
Thyroid panel stable. Continue levothyroxine 175 mcg daily.  Will continue to monitor.  Recheck thyroid panel in 6 months.

## 2022-11-19 NOTE — Assessment & Plan Note (Signed)
BP goal <130/80. Stable, at goal. Continue amlodipine 5 mg daily, hydrochlorothiazide 12.5 mg daily, metoprolol succinate 100 mg daily, valsartan 160 mg daily.  Repeat CMP before next visit.  Will continue to monitor.

## 2022-11-19 NOTE — Assessment & Plan Note (Signed)
Last lipid panel: LDL 128, HDL 30, triglycerides 339.  Patient previously declined statin.  Continue prioritizing fiber and lean protein intake, limit trans/saturated fats.  Recheck lipid panel before next appointment and will adjust management as indicated by results.

## 2022-11-19 NOTE — Progress Notes (Signed)
Complete physical exam  Patient: Miranda Delacruz   DOB: [birth year]   62 y.o. Female  MRN: [id number]  Subjective:    Chief Complaint  Patient presents with   Annual Exam    Miranda Delacruz is a 62 y.o. female who presents today for a complete physical exam. She reports consuming a low fat diet.  She generally feels well. She reports sleeping well. She does not have additional problems to discuss today.    Most recent fall risk assessment:    11/05/2022   10:23 AM  Fall Risk   Falls in the past year? 0  Number falls in past yr: 0  Injury with Fall? 0  Risk for fall due to : No Fall Risks  Follow up Falls evaluation completed     Most recent depression and anxiety screenings:    11/19/2022    9:25 AM 08/17/2022   10:14 AM  PHQ 2/9 Scores  PHQ - 2 Score 0 0  PHQ- 9 Score 0       05/19/2022   10:40 AM 08/18/2021    1:15 PM 05/12/2021    4:01 PM 03/18/2021    1:30 PM  GAD 7 : Generalized Anxiety Score  Nervous, Anxious, on Edge 0 0 0 0  Control/stop worrying 0 0 0 0  Worry too much - different things 0 0 0 0  Trouble relaxing 0 0 0 0  Restless 0 0 0 0  Easily annoyed or irritable 0 0 0 0  Afraid - awful might happen 0 0 0 0  Total GAD 7 Score 0 0 0 0  Anxiety Difficulty   Not difficult at all     Patient Active Problem List   Diagnosis Date Noted   Type 2 diabetes mellitus without complication, without long-term current use of insulin (HCC) 12/16/2017   Menopause present 09/25/2015   Migraine headache with aura 02/11/2015   Benign neoplasm of ascending colon    Benign neoplasm of transverse colon    Benign neoplasm of sigmoid colon    Hepatic steatosis 12/18/2014   Elevated liver enzymes- from hepatic steatosis 12/10/2014   Vitamin D deficiency 12/10/2014   Mixed diabetic hyperlipidemia associated with type 2 diabetes mellitus (HCC) 12/10/2014   Hypothyroidism 11/21/2014   Hx of papillary thyroid cancer 11/21/2014   Hypertension associated with diabetes (HCC)  11/21/2014   BMI 40.0-44.9, adult (HCC) 11/21/2014    Past Surgical History:  Procedure Laterality Date   CHOLECYSTECTOMY N/A 09/19/2021   Procedure: LAPAROSCOPIC CHOLECYSTECTOMY WITH ICG;  Surgeon: Andria Meuse, MD;  Location: [facility];  Service: General;  Laterality: N/A;   COLONOSCOPY  2000   normal   COLONOSCOPY     COLONOSCOPY WITH PROPOFOL N/A 01/22/2015   Procedure: COLONOSCOPY WITH PROPOFOL;  Surgeon: Midge Minium, MD;  Location: [facility];  Service: Endoscopy;  Laterality: N/A;   POLYPECTOMY     THYROIDECTOMY  2009   TONSILLECTOMY AND ADENOIDECTOMY  1976   TUBAL LIGATION  1997   Social History   Tobacco Use   Smoking status: Former    Current packs/day: 0.00    Types: Cigarettes    Quit date: 02/24/1987    Years since quitting: 35.7    Passive exposure: Past   Smokeless tobacco: Never  Vaping Use   Vaping status: Never Used  Substance Use Topics   Alcohol use: Not Currently   Drug use: No   Family History  Problem Relation Age of Onset   Hypertension Mother  Hypertension Maternal Grandmother    Stroke Maternal Grandmother    Colon polyps Neg Hx    Colon cancer Neg Hx    Esophageal cancer Neg Hx    Rectal cancer Neg Hx    Stomach cancer Neg Hx    No Known Allergies   Patient Care Team: Melida Quitter, PA as PCP - General (Family Medicine) Copland, Chucky May as Referring Physician (Obstetrics and Gynecology) Sherlon Handing, MD as Consulting Physician (Endocrinology) Earley Favor, MD as Consulting Physician (Obstetrics and Gynecology)   Outpatient Medications Prior to Visit  Medication Sig   APPLE CIDER VINEGAR PO    Bacillus Coagulans-Inulin (PROBIOTIC-PREBIOTIC PO) Take by mouth.   Cholecalciferol (VITAMIN D3) 125 MCG (5000 UT) CAPS Take 5,000 Units by mouth daily.   fluticasone (FLONASE) 50 MCG/ACT nasal spray Place 2 sprays into both nostrils 2 (two) times daily.   milk thistle 175 MG tablet Take 175 mg by mouth daily.    Omega 3 1200 MG CAPS    ONETOUCH VERIO test strip 1 each daily.   oxymetazoline (AFRIN) 0.05 % nasal spray Place 1 spray into both nostrils 2 (two) times daily as needed for congestion.   [DISCONTINUED] amLODipine (NORVASC) 5 MG tablet Take 1 tablet (5 mg total) by mouth daily.   [DISCONTINUED] hydrochlorothiazide (MICROZIDE) 12.5 MG capsule Take 1 capsule (12.5 mg total) by mouth daily.   [DISCONTINUED] levothyroxine (EUTHYROX) 175 MCG tablet Take 1 tablet (175 mcg total) by mouth daily before breakfast.   [DISCONTINUED] magnesium oxide (MAG-OX) 400 MG tablet Take 400 mg by mouth daily.   [DISCONTINUED] metFORMIN (GLUCOPHAGE) 500 MG tablet Take 2 tablets (1,000 mg total) by mouth 2 (two) times daily with a meal.   [DISCONTINUED] metoprolol succinate (TOPROL-XL) 100 MG 24 hr tablet TAKE 1 TABLET BY MOUTH TWICE DAILY WITH MEALS OR  IMMEDIATELY  FOLLOWING  A  MEAL   [DISCONTINUED] sitaGLIPtin (JANUVIA) 50 MG tablet Take 1 tablet (50 mg total) by mouth daily.   [DISCONTINUED] valsartan (DIOVAN) 160 MG tablet Take 1 tablet (160 mg total) by mouth daily.   No facility-administered medications prior to visit.    Review of Systems  Constitutional:  Negative for chills, fever and malaise/fatigue.  HENT:  Negative for congestion and hearing loss.   Eyes:  Negative for blurred vision and double vision.  Respiratory:  Negative for cough and shortness of breath.   Cardiovascular:  Negative for chest pain, palpitations and leg swelling.  Gastrointestinal:  Negative for abdominal pain, constipation, diarrhea and heartburn.  Genitourinary:  Negative for frequency and urgency.  Musculoskeletal:  Negative for myalgias and neck pain.  Neurological:  Negative for headaches.  Endo/Heme/Allergies:  Negative for polydipsia.  Psychiatric/Behavioral:  Negative for depression. The patient is not nervous/anxious and does not have insomnia.       Objective:    BP 122/80 (BP Location: Left Arm, Patient  Position: Sitting, Cuff Size: Large)   Pulse 63   Resp 18   Ht 5' 6.5" (1.689 m)   Wt 256 lb (116.1 kg)   SpO2 96%   BMI 40.70 kg/m    Physical Exam Constitutional:      General: She is not in acute distress.    Appearance: Normal appearance.  HENT:     Head: Normocephalic and atraumatic.     Right Ear: Tympanic membrane, ear canal and external ear normal. There is no impacted cerumen.     Left Ear: Tympanic membrane, ear canal and external  ear normal. There is no impacted cerumen.     Ears:     Comments: Dry, flaky skin at the opening of the left ear canal    Nose: Nose normal.     Mouth/Throat:     Mouth: Mucous membranes are moist.     Pharynx: No oropharyngeal exudate or posterior oropharyngeal erythema.  Eyes:     Extraocular Movements: Extraocular movements intact.     Conjunctiva/sclera: Conjunctivae normal.     Pupils: Pupils are equal, round, and reactive to light.  Neck:     Thyroid: No thyroid mass, thyromegaly or thyroid tenderness.  Cardiovascular:     Rate and Rhythm: Normal rate and regular rhythm.     Heart sounds: Normal heart sounds. No murmur heard.    No friction rub. No gallop.  Pulmonary:     Effort: Pulmonary effort is normal. No respiratory distress.     Breath sounds: Normal breath sounds. No wheezing, rhonchi or rales.  Abdominal:     General: Abdomen is flat. Bowel sounds are normal. There is no distension.     Palpations: There is no mass.     Tenderness: There is no abdominal tenderness. There is no guarding.  Musculoskeletal:        General: Normal range of motion.     Cervical back: Normal range of motion and neck supple.  Lymphadenopathy:     Cervical: No cervical adenopathy.  Skin:    General: Skin is warm and dry.  Neurological:     Mental Status: She is alert and oriented to person, place, and time.     Cranial Nerves: No cranial nerve deficit.     Motor: No weakness.     Deep Tendon Reflexes: Reflexes normal.  Psychiatric:         Mood and Affect: Mood normal.       Assessment & Plan:    Routine Health Maintenance and Physical Exam  Immunization History  Administered Date(s) Administered   Hep A / Hep B 07/23/2021, 09/22/2021, 02/02/2022   Influenza,inj,Quad PF,6+ Mos 11/21/2014, 01/03/2016, 12/01/2016, 12/16/2017   Influenza-Unspecified 11/21/2014, 01/03/2016, 12/01/2016   Pneumococcal Conjugate-13 11/30/2016, 12/01/2016   Tdap 11/21/2014, 09/13/2015, 09/22/2021   Zoster Recombinant(Shingrix) 07/23/2021, 09/22/2021    Health Maintenance  Topic Date Due   COVID-19 Vaccine (1) Never done   OPHTHALMOLOGY EXAM  01/24/2020   FOOT EXAM  11/19/2022   INFLUENZA VACCINE  05/24/2023 (Originally 09/24/2022)   Diabetic kidney evaluation - Urine ACR  02/18/2023   Diabetic kidney evaluation - eGFR measurement  05/12/2023   HEMOGLOBIN A1C  05/19/2023   Colonoscopy  05/21/2023   MAMMOGRAM  08/27/2024   Cervical Cancer Screening (HPV/Pap Cotest)  11/04/2025   DTaP/Tdap/Td (4 - Td or Tdap) 09/23/2031   Hepatitis C Screening  Completed   HIV Screening  Completed   Zoster Vaccines- Shingrix  Completed   HPV VACCINES  Aged Out    Discussed health benefits of physical activity, and encouraged her to engage in regular exercise appropriate for her age and condition.  Wellness examination  Hypertension associated with diabetes (HCC) Assessment & Plan: BP goal <130/80. Stable, at goal. Continue amlodipine 5 mg daily, hydrochlorothiazide 12.5 mg daily, metoprolol succinate 100 mg daily, valsartan 160 mg daily.  Repeat CMP before next visit.  Will continue to monitor.  Orders: -     amLODIPine Besylate; Take 1 tablet (5 mg total) by mouth daily.  Dispense: 90 tablet; Refill: 1 -     hydroCHLOROthiazide;  Take 1 capsule (12.5 mg total) by mouth daily.  Dispense: 90 capsule; Refill: 1 -     Metoprolol Succinate ER; TAKE 1 TABLET BY MOUTH TWICE DAILY WITH MEALS OR  IMMEDIATELY  FOLLOWING  A  MEAL  Dispense: 180 tablet; Refill:  1 -     Valsartan; Take 1 tablet (160 mg total) by mouth daily.  Dispense: 90 tablet; Refill: 1 -     CBC with Differential/Platelet; Future -     Comprehensive metabolic panel; Future  Mixed diabetic hyperlipidemia associated with type 2 diabetes mellitus (HCC) Assessment & Plan: Last lipid panel: LDL 128, HDL 30, triglycerides 339.  Patient previously declined statin.  Continue prioritizing fiber and lean protein intake, limit trans/saturated fats.  Recheck lipid panel before next appointment and will adjust management as indicated by results.  Orders: -     CBC with Differential/Platelet; Future -     Comprehensive metabolic panel; Future -     Lipid panel; Future  Type 2 diabetes mellitus without complication, without long-term current use of insulin (HCC) Assessment & Plan: Most recent A1c 7.2, repeated today and is stable at 7.2.  Continue metformin 1000 mg twice daily, sitagliptin 50 mg daily.  Over the next few months, continue making lifestyle interventions a priority and we will recheck A1c.  If still above goal of 7.0, may increase sitagliptin, max 100 mg daily.  Patient planning on updating eye exam in the next few months.  Will complete foot exam at next follow-up visit.  Orders: -     metFORMIN HCl; Take 2 tablets (1,000 mg total) by mouth 2 (two) times daily with a meal.  Dispense: 180 tablet; Refill: 1 -     SITagliptin Phosphate; Take 1 tablet (50 mg total) by mouth daily.  Dispense: 90 tablet; Refill: 1 -     POCT glycosylated hemoglobin (Hb A1C) -     CBC with Differential/Platelet; Future -     Comprehensive metabolic panel; Future -     Hemoglobin A1c; Future  Acquired hypothyroidism Assessment & Plan: Thyroid panel stable. Continue levothyroxine 175 mcg daily.  Will continue to monitor.  Recheck thyroid panel in 6 months.  Orders: -     Levothyroxine Sodium; Take 1 tablet (175 mcg total) by mouth daily before breakfast.  Dispense: 90 tablet; Refill:  1    Return in about 3 months (around 02/18/2023) for follow-up for HTN, HLD, DM, fasting blood work 1 week before.     Melida Quitter, PA

## 2022-11-23 ENCOUNTER — Ambulatory Visit: Payer: BC Managed Care – PPO | Admitting: Obstetrics and Gynecology

## 2022-11-27 ENCOUNTER — Ambulatory Visit: Payer: BC Managed Care – PPO | Admitting: Obstetrics and Gynecology

## 2022-12-14 ENCOUNTER — Ambulatory Visit: Payer: BC Managed Care – PPO | Admitting: Obstetrics and Gynecology

## 2022-12-14 ENCOUNTER — Other Ambulatory Visit (HOSPITAL_COMMUNITY)
Admission: RE | Admit: 2022-12-14 | Discharge: 2022-12-14 | Disposition: A | Discharge status: Home / Self Care | Payer: BC Managed Care – PPO | Source: Ambulatory Visit | Attending: Obstetrics and Gynecology | Admitting: Obstetrics and Gynecology

## 2022-12-14 ENCOUNTER — Encounter: Payer: Self-pay | Admitting: Obstetrics and Gynecology

## 2022-12-14 VITALS — BP 128/70 | HR 78 | Wt 257.0 lb

## 2022-12-14 DIAGNOSIS — D229 Melanocytic nevi, unspecified: Secondary | ICD-10-CM | POA: Diagnosis not present

## 2022-12-14 DIAGNOSIS — D071 Carcinoma in situ of vulva: Secondary | ICD-10-CM | POA: Diagnosis not present

## 2022-12-14 NOTE — Progress Notes (Signed)
    62 y.o. y.o. female here for vulvar punch biopsy Procedure: Punch biopsy Left inner vulva 3-8mm atypical mole with irregular borders and different colors Patient consented for the procedure with written consent.  The area was cleaned with betadine.  1 cc of lidocaine with epi was injected subcutaneously.  A 3 mm punch biopsy was used and the biopsy was then lifted and sent to pathology.  Hemostasis was achieved with silver nitrate.  Patient tolerated the procedure well.  Post care instructions were discussed with patient.  Patient encouraged to return to clinic with any concerns.  Earley Favor

## 2022-12-14 NOTE — Addendum Note (Signed)
Addended by: Earley Favor on: 12/14/2022 04:53 PM   Modules accepted: Orders

## 2022-12-16 ENCOUNTER — Telehealth: Payer: Self-pay | Admitting: Obstetrics and Gynecology

## 2022-12-16 DIAGNOSIS — D071 Carcinoma in situ of vulva: Secondary | ICD-10-CM

## 2022-12-16 MED ORDER — ESTRADIOL 0.1 MG/GM VA CREA: 1.0000 | TOPICAL_CREAM | Freq: Every day | VAGINAL | 12 refills | Status: DC

## 2022-12-16 NOTE — Telephone Encounter (Signed)
Patient called with VIN3 results and need for consult with [facility] for treatment and management.  Discussed precursor to cancer and need for treatment since at a stage3.  She agreed. Dr. Karma Greaser

## 2022-12-17 ENCOUNTER — Encounter: Payer: Self-pay | Admitting: Gynecologic Oncology

## 2022-12-17 NOTE — Progress Notes (Signed)
GYNECOLOGIC ONCOLOGY NEW PATIENT CONSULTATION   Patient Name: Miranda Delacruz  Patient Age: 62 y.o. Date of Service: 12/18/22 Referring Provider: Dr. Arman Filter  Primary Care Provider: Melida Quitter, [place] Consulting Provider: Eugene Garnet, MD   Assessment/Plan:  Postmenopausal patient with high-grade vulvar dysplasia.  We discussed 2 different pathways that can lead to vulvar dysplasia and ultimately vulvar cancer, one driven by HPV the other by lichen sclerosis.  Given recent biopsy, I suspect that this is HPV related.  She does not have exam findings or symptoms that would be suggestive of lichen sclerosus.  The patient's Pap in 2020 was high risk HPV negative.  She did not have HPV testing done with her most recent Pap.  HPV testing was collected today.   We spent some time reviewing the location of her lesion.  I drew a picture for her and discussed possible treatment options.  Given the size and appearance of this lesion, I am recommending surgical treatment.  Discussed that surgical treatment options include laser ablation and excision.  With small lesion and unifocal disease, I recommend surgical excision with a wide local excision.     We discussed the plan for upcoming exam under anesthesia, wide local excision, any other indicated procedures such as vulvar biopsies.  I reviewed risks which include but are not limited to bleeding, need for blood transfusion, infection, and damage to surrounding structures.  We discussed risk related to anesthesia, risk of VTE and rare risk of death.  All questions were answered.  Perioperative instructions were reviewed with her today.   Patient has a history of type 2 diabetes, not on insulin.  Her last hemoglobin A1c was 7.2%.  She has not been checking her blood sugars recently at home, which I encouraged her to do.  We discussed due to the increased risk related to perioperative morbidity and wound infection with blood sugars great than  180-200.  A copy of this note was sent to the patient's referring provider.   55 minutes of total time was spent for this patient encounter, including preparation, face-to-face counseling with the patient and coordination of care, and documentation of the encounter.  Eugene Garnet, MD  Division of Gynecologic Oncology  Department of Obstetrics and Gynecology  [facility]  ___________________________________________  Chief Complaint: Chief Complaint  Patient presents with   VIN3    History of Present Illness:  Miranda Delacruz is a 62 y.o. y.o. female who is seen in consultation at the request of Dr. Karma Greaser for an evaluation of vulvar dysplasia.  Patient was seen in mid September for an annual exam.  Lesion was seen on the left vulva.  The patient returned on 10/21 for punch biopsy of a 3-4 mm atypical appearing mole with irregular borders along the left inner vulva.  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  Today, the patient reports doing well.  She denies any symptoms including bleeding, discharge, vulvar burning, pruritus, or irritation/pain.  She denies any urinary symptoms.  She notes some bowel changes since her gallbladder surgery that include loose stools if she eats too much fatty food.  Denies any other changes to bowel function.  She denies any recent weight changes.  She has a history of type 2 diabetes mellitus.  She takes metformin and Januvia for this.  Her last hemoglobin A1c in late September was 7.2%.  She has not been checking her blood sugars recently because she ran out of strips.  PAST MEDICAL HISTORY:  Past Medical History:  Diagnosis Date   Adrenal adenoma    Colon polyps    Diabetes mellitus without complication (HCC)    metformin, januvia   Gallstones    GERD (gastroesophageal reflux disease)    Hepatic steatosis    Hyperlipidemia    Hypertension    Hypothyroidism    Migraines    Thyroid cancer (HCC)    thyroid     PAST  SURGICAL HISTORY:  Past Surgical History:  Procedure Laterality Date   CHOLECYSTECTOMY N/A 09/19/2021   Procedure: LAPAROSCOPIC CHOLECYSTECTOMY WITH ICG;  Surgeon: Andria Meuse, MD;  Location: [facility];  Service: General;  Laterality: N/A;   COLONOSCOPY  2000   normal   COLONOSCOPY     COLONOSCOPY WITH PROPOFOL N/A 01/22/2015   Procedure: COLONOSCOPY WITH PROPOFOL;  Surgeon: Midge Minium, MD;  Location: [facility] ENDOSCOPY;  Service: Endoscopy;  Laterality: N/A;   POLYPECTOMY     right knee meniscus     2 years ago   THYROIDECTOMY  2009   TONSILLECTOMY AND ADENOIDECTOMY  1976   TUBAL LIGATION  1997    OB/GYN HISTORY:  OB History  Gravida Para Term Preterm AB Living  2 2 2     2   SAB IAB Ectopic Multiple Live Births          2    # Outcome Date GA Lbr Len/2nd Weight Sex Type Anes PTL Lv  2 Term           1 Term             No LMP recorded. Patient is postmenopausal.  Age at menarche: 49  Age at menopause: 68 Hx of HRT: denies Hx of STDs: dnies Last pap: 2024 - [facility] (had HPV testing in 2020, negative) History of abnormal pap smears: denies  SCREENING STUDIES:  Last mammogram: 2024  Last colonoscopy: 2022  MEDICATIONS: Outpatient Encounter Medications as of 12/18/2022  Medication Sig   amLODipine (NORVASC) 5 MG tablet Take 1 tablet (5 mg total) by mouth daily.   APPLE CIDER VINEGAR PO    Bacillus Coagulans-Inulin (PROBIOTIC-PREBIOTIC PO) Take by mouth.   Cholecalciferol (VITAMIN D3) 125 MCG (5000 UT) CAPS Take 5,000 Units by mouth daily.   fluticasone (FLONASE) 50 MCG/ACT nasal spray Place 2 sprays into both nostrils 2 (two) times daily.   hydrochlorothiazide (MICROZIDE) 12.5 MG capsule Take 1 capsule (12.5 mg total) by mouth daily.   levothyroxine (EUTHYROX) 175 MCG tablet Take 1 tablet (175 mcg total) by mouth daily before breakfast.   metFORMIN (GLUCOPHAGE) 500 MG tablet Take 2 tablets (1,000 mg total) by mouth 2 (two) times daily with a meal.   metoprolol  succinate (TOPROL-XL) 100 MG 24 hr tablet TAKE 1 TABLET BY MOUTH TWICE DAILY WITH MEALS OR  IMMEDIATELY  FOLLOWING  A  MEAL   milk thistle 175 MG tablet Take 175 mg by mouth daily.   Omega 3 1200 MG CAPS    ONETOUCH VERIO test strip 1 each daily.   oxymetazoline (AFRIN) 0.05 % nasal spray Place 1 spray into both nostrils 2 (two) times daily as needed for congestion.   sitaGLIPtin (JANUVIA) 50 MG tablet Take 1 tablet (50 mg total) by mouth daily.   valsartan (DIOVAN) 160 MG tablet Take 1 tablet (160 mg total) by mouth daily.   vitamin k 100 MCG tablet Take 100 mcg by mouth daily.   estradiol (ESTRACE VAGINAL) 0.1 MG/GM vaginal cream Place 1 Applicatorful vaginally at bedtime. Apply pea  size amount to area and to the vagina weekly for 3 weeks then 3 times a week (Patient not taking: Reported on 12/17/2022)   No facility-administered encounter medications on file as of 12/18/2022.    ALLERGIES:  No Known Allergies   FAMILY HISTORY:  Family History  Problem Relation Age of Onset   Hypertension Mother    Hypertension Maternal Grandmother    Stroke Maternal Grandmother    Colon polyps Neg Hx    Colon cancer Neg Hx    Esophageal cancer Neg Hx    Rectal cancer Neg Hx    Stomach cancer Neg Hx    Breast cancer Neg Hx    Ovarian cancer Neg Hx    Pancreatic cancer Neg Hx      SOCIAL HISTORY:  Social Connections: Not on file    REVIEW OF SYSTEMS:  + diarrhea Denies appetite changes, fevers, chills, fatigue, unexplained weight changes. Denies hearing loss, neck lumps or masses, mouth sores, ringing in ears or voice changes. Denies cough or wheezing.  Denies shortness of breath. Denies chest pain or palpitations. Denies leg swelling. Denies abdominal distention, pain, blood in stools, constipation, nausea, vomiting, or early satiety. Denies pain with intercourse, dysuria, frequency, hematuria or incontinence. Denies hot flashes, pelvic pain, vaginal bleeding or vaginal discharge.    Denies joint pain, back pain or muscle pain/cramps. Denies itching, rash, or wounds. Denies dizziness, headaches, numbness or seizures. Denies swollen lymph nodes or glands, denies easy bruising or bleeding. Denies anxiety, depression, confusion, or decreased concentration.  Physical Exam:  Vital Signs for this encounter:  Blood pressure 118/65, pulse 72, temperature 99.1 F (37.3 C), temperature source Oral, resp. rate 19, height 5\' 7"  (1.702 m), weight 256 lb 3.2 oz (116.2 kg), SpO2 100%. Body mass index is 40.13 kg/m. General: Alert, oriented, no acute distress.  HEENT: Normocephalic, atraumatic. Sclera anicteric.  Chest: Clear to auscultation bilaterally. No wheezes, rhonchi, or rales. Cardiovascular: Regular rate and rhythm, no murmurs, rubs, or gallops.  Abdomen: Obese. Normoactive bowel sounds. Soft, nondistended, nontender to palpation. No masses or hepatosplenomegaly appreciated. No palpable fluid wave.  Extremities: Grossly normal range of motion. Warm, well perfused. No edema bilaterally.  Skin: No rashes or lesions.  Lymphatics: No cervical, supraclavicular, or inguinal adenopathy.  GU:  Normal external female genitalia. No discharge or bleeding.  Along the left outer labia at approximately 4-5:00, there is a 1-2 cm hyperpigmented slightly raised lesion with well-healed 3 mm punch biopsy.  5% acetic acid was applied to the entire vulva.  No acetowhite changes noted.  Several other 3-4 mm hyperpigmented lesions consistent with moles seen.             Bladder/urethra:  No lesions or masses, well supported bladder             Vagina: Well-rugated, no lesions noted.             Cervix: Normal appearing, no lesions. HPV collected.             Uterus: Small, mobile, no parametrial involvement or nodularity.             Adnexa: No masses appreciated.  Rectal: Deferred.  LABORATORY AND RADIOLOGIC DATA:  Outside medical records were reviewed to synthesize the above history, along  with the history and physical obtained during the visit.   Lab Results  Component Value Date   WBC 6.7 11/11/2021   HGB 14.3 11/11/2021   HCT 43.1 11/11/2021   PLT 233 11/11/2021  GLUCOSE 220 (H) 05/12/2022   CHOL 219 (H) 05/12/2022   TRIG 339 (H) 05/12/2022   HDL 30 (L) 05/12/2022   LDLCALC 128 (H) 05/12/2022   ALT 64 (H) 05/12/2022   AST 39 05/12/2022   NA 136 05/12/2022   K 4.3 05/12/2022   CL 99 05/12/2022   CREATININE 0.73 05/12/2022   BUN 12 05/12/2022   CO2 20 05/12/2022   TSH 0.842 11/13/2022   INR 1.0 09/19/2021   HGBA1C 7.2 11/19/2022   MICROALBUR 30 02/17/2022

## 2022-12-18 ENCOUNTER — Inpatient Hospital Stay: Payer: BC Managed Care – PPO | Attending: Gynecologic Oncology | Admitting: Gynecologic Oncology

## 2022-12-18 ENCOUNTER — Other Ambulatory Visit (HOSPITAL_COMMUNITY)
Admission: RE | Admit: 2022-12-18 | Discharge: 2022-12-18 | Disposition: A | Discharge status: Home / Self Care | Payer: BC Managed Care – PPO | Source: Ambulatory Visit | Attending: Gynecologic Oncology | Admitting: Gynecologic Oncology

## 2022-12-18 ENCOUNTER — Encounter: Payer: Self-pay | Admitting: Gynecologic Oncology

## 2022-12-18 ENCOUNTER — Inpatient Hospital Stay (HOSPITAL_BASED_OUTPATIENT_CLINIC_OR_DEPARTMENT_OTHER): Payer: BC Managed Care – PPO | Admitting: Gynecologic Oncology

## 2022-12-18 ENCOUNTER — Other Ambulatory Visit: Payer: Self-pay

## 2022-12-18 VITALS — BP 118/65 | HR 72 | Temp 99.1°F | Resp 19 | Ht 67.0 in | Wt 256.2 lb

## 2022-12-18 DIAGNOSIS — E119 Type 2 diabetes mellitus without complications: Secondary | ICD-10-CM | POA: Insufficient documentation

## 2022-12-18 DIAGNOSIS — Z79899 Other long term (current) drug therapy: Secondary | ICD-10-CM | POA: Insufficient documentation

## 2022-12-18 DIAGNOSIS — I10 Essential (primary) hypertension: Secondary | ICD-10-CM | POA: Diagnosis not present

## 2022-12-18 DIAGNOSIS — Z780 Asymptomatic menopausal state: Secondary | ICD-10-CM | POA: Diagnosis not present

## 2022-12-18 DIAGNOSIS — Z860100 Personal history of colon polyps, unspecified: Secondary | ICD-10-CM | POA: Insufficient documentation

## 2022-12-18 DIAGNOSIS — Z79890 Hormone replacement therapy (postmenopausal): Secondary | ICD-10-CM | POA: Diagnosis not present

## 2022-12-18 DIAGNOSIS — Z6841 Body Mass Index (BMI) 40.0 and over, adult: Secondary | ICD-10-CM | POA: Insufficient documentation

## 2022-12-18 DIAGNOSIS — Z1151 Encounter for screening for human papillomavirus (HPV): Secondary | ICD-10-CM | POA: Diagnosis not present

## 2022-12-18 DIAGNOSIS — K760 Fatty (change of) liver, not elsewhere classified: Secondary | ICD-10-CM | POA: Insufficient documentation

## 2022-12-18 DIAGNOSIS — D071 Carcinoma in situ of vulva: Secondary | ICD-10-CM | POA: Diagnosis not present

## 2022-12-18 DIAGNOSIS — E785 Hyperlipidemia, unspecified: Secondary | ICD-10-CM | POA: Diagnosis not present

## 2022-12-18 DIAGNOSIS — E039 Hypothyroidism, unspecified: Secondary | ICD-10-CM | POA: Diagnosis not present

## 2022-12-18 DIAGNOSIS — Z7984 Long term (current) use of oral hypoglycemic drugs: Secondary | ICD-10-CM | POA: Insufficient documentation

## 2022-12-18 DIAGNOSIS — E66813 Obesity, class 3: Secondary | ICD-10-CM | POA: Diagnosis not present

## 2022-12-18 DIAGNOSIS — K219 Gastro-esophageal reflux disease without esophagitis: Secondary | ICD-10-CM | POA: Diagnosis not present

## 2022-12-18 DIAGNOSIS — Z85850 Personal history of malignant neoplasm of thyroid: Secondary | ICD-10-CM | POA: Diagnosis not present

## 2022-12-18 MED ORDER — TRAMADOL HCL 50 MG PO TABS
50.0000 mg | ORAL_TABLET | Freq: Four times a day (QID) | ORAL | 0 refills | Status: AC | PRN
Start: 2022-12-18 — End: ?

## 2022-12-18 MED ORDER — SENNOSIDES-DOCUSATE SODIUM 8.6-50 MG PO TABS
2.0000 | ORAL_TABLET | Freq: Every day | ORAL | 0 refills | Status: AC
Start: 2022-12-18 — End: ?

## 2022-12-18 NOTE — Patient Instructions (Signed)
Preparing for your Surgery   Plan for surgery on January 13, 2023 with Dr. Eugene Garnet at [facility] (will be updated closer to the date of the facility based on availability). You will be scheduled for pelvic examination under anesthesia, wide local excision of the vulva, possible biopsies.    Pre-operative Testing -You will receive a phone call from presurgical testing at [facility] to discuss surgery instructions and arrange for lab work if needed.   -Bring your insurance card, copy of an advanced directive if applicable, medication list.   -You should not be taking blood thinners or aspirin at least ten days prior to surgery unless instructed by your surgeon.   -STOP YOUR VITAMIN K AND OMEGA 3 AT LEAST 10 DAYS BEFORE SURGERY. Do not take supplements such as fish oil (omega 3), red yeast rice, turmeric before your surgery. You want to avoid medications with aspirin in them including headache powders such as BC or Goody's), Excedrin migraine.   Day Before Surgery at Home -You will be advised you can have clear liquids up until 3 hours before your surgery.     Your role in recovery Your role is to become active as soon as directed by your doctor, while still giving yourself time to heal.  Rest when you feel tired. You will be asked to do the following in order to speed your recovery:   - Cough and breathe deeply. This helps to clear and expand your lungs and can prevent pneumonia after surgery.  - STAY ACTIVE WHEN YOU GET HOME. Do mild physical activity. Walking or moving your legs help your circulation and body functions return to normal. Do not try to get up or walk alone the first time after surgery.   -If you develop swelling on one leg or the other, pain in the back of your leg, redness/warmth in one of your legs, please call the office or go to the Emergency Room to have a doppler to rule out a blood clot. For shortness of breath, chest  pain-seek care in the Emergency Room as soon as possible. - Actively manage your pain. Managing your pain lets you move in comfort. We will ask you to rate your pain on a scale of zero to 10. It is your responsibility to tell your doctor or nurse where and how much you hurt so your pain can be treated.   Special Considerations -Your final pathology results from surgery should be available around one week after surgery and the results will be relayed to you when available.   -FMLA forms can be faxed to [phone] and please allow 5-7 business days for completion.   Pain Management After Surgery -You will be prescribed your pain medication and bowel regimen medications before surgery so that you can have these available when you are discharged from the hospital. The pain medication is for use ONLY AFTER surgery and a new prescription will not be given.    -Make sure that you have Tylenol and Ibuprofen at home IF YOU ARE ABLE TO TAKE THESE MEDICATIONS to use on a regular basis after surgery for pain control. We recommend alternating the medications every hour to six hours since they work differently and are processed in the body differently for pain relief.   -Review the attached handout on narcotic use and their risks and side effects.    Bowel Regimen -You will be prescribed Sennakot-S to take nightly to prevent constipation especially if  you are taking the narcotic pain medication intermittently.  It is important to prevent constipation and drink adequate amounts of liquids. You can stop taking this medication when you are not taking pain medication and you are back on your normal bowel routine.   Risks of Surgery Risks of surgery are low but include bleeding, infection, damage to surrounding structures, re-operation, blood clots, and very rarely death.   AFTER SURGERY INSTRUCTIONS   Return to work:  2 weeks or less if applicable, BASED ON OCCUPATION   We recommend purchasing several bags  of frozen green peas and dividing them into ziploc bags. You will want to keep these in the freezer and have them ready to use as ice packs to the vulvar incision. Once the ice pack is no longer cold, you can get another from the freezer. The frozen peas mold to your body better than a regular ice pack.    Activity: 1. Be up and out of the bed during the day.  Take a nap if needed.  You may walk up steps but be careful and use the hand rail.  Stair climbing will tire you more than you think, you may need to stop part way and rest.    2. No lifting or straining for 4 weeks over 10 pounds. No pushing, pulling, straining for 4 weeks.   3. No driving for minimum 24 hours after surgery.  Do not drive if you are taking narcotic pain medicine and make sure that your reaction time has returned.    4. You can shower as soon as the next day after surgery. Shower daily. No tub baths or submerging your body in water until cleared by your surgeon. If you have the soap that was given to you by pre-surgical testing that was used before surgery, you do not need to use it afterwards because this can irritate your incisions.    5. No sexual activity and nothing in the vagina for 4-6 weeks.   6. You may experience vaginal spotting and discharge after surgery.  The spotting is normal but if you experience heavy bleeding, call our office.   7. Take Tylenol or ibuprofen first for pain if you are able to take these medications and only use narcotic pain medication for severe pain not relieved by the Tylenol or Ibuprofen.  Monitor your Tylenol intake to a max of 4,000 mg in a 24 hour period. You can alternate these medications after surgery.   Diet: 1. Low sodium Heart Healthy Diet is recommended but you are cleared to resume your normal (before surgery) diet after your procedure.   2. It is safe to use a laxative, such as Miralax or Colace, if you have difficulty moving your bowels. You have been prescribed Sennakot at  bedtime every evening to keep bowel movements regular and to prevent constipation.     Wound Care: 1. Keep clean and dry.  Shower daily.   Reasons to call the Doctor: Fever - Oral temperature greater than 100.4 degrees Fahrenheit Foul-smelling vaginal discharge Difficulty urinating Nausea and vomiting Increased pain at the site of the incision that is unrelieved with pain medicine. Difficulty breathing with or without chest pain New calf pain especially if only on one side Sudden, continuing increased vaginal bleeding with or without clots.   Contacts: For questions or concerns you should contact:   Dr. Eugene Garnet at [phone]   Warner Mccreedy, NP at [phone]   After Hours: call [phone] and have the GYN  Oncologist paged/contacted (after 5 pm or on the weekends).   Messages sent via mychart are for non-urgent matters and are not responded to after hours so for urgent needs, please call the after hours number.

## 2022-12-18 NOTE — Progress Notes (Signed)
Patient here for new patient consultation with Dr. Pricilla Holm and for a pre-operative appointment prior to her scheduled surgery on 01/13/23. She is scheduled for pelvic examination under anesthesia, wide local excision of the vulva, possible biopsies. The surgery was discussed in detail.  See after visit summary for additional details.       Discussed post-op pain management in detail including the aspects of the enhanced recovery pathway.  Advised her that a new prescription would be sent in for tramadol and it is only to be used for after her upcoming surgery.  We discussed the use of tylenol post-op and to monitor for a maximum of 4,000 mg in a 24 hour period.  Also prescribed sennakot to be used after surgery and to hold if having loose stools.  Discussed bowel regimen in detail.     Discussed measures to take at home to prevent DVT including frequent mobility.  Reportable signs and symptoms of DVT discussed. Post-operative instructions discussed and expectations for after surgery. Incisional care discussed as well including reportable signs and symptoms including erythema, drainage, wound separation.     5 minutes spent with the patient.  Verbalizing understanding of material discussed. No needs or concerns voiced at the end of the visit.   Advised patient to call for any needs.  Advised that her post-operative medications had been prescribed and could be picked up at any time.    This appointment is included in the global surgical bundle as pre-operative teaching and has no charge.

## 2022-12-18 NOTE — Patient Instructions (Addendum)
Preparing for your Surgery  Plan for surgery on January 14, 2023 with Dr. Eugene Garnet at [facility] (will be updated closer to the date of the facility based on availability). You will be scheduled for pelvic examination under anesthesia, wide local excision of the vulva, possible biopsies.   Pre-operative Testing -You will receive a phone call from presurgical testing at [facility] to discuss surgery instructions and arrange for lab work if needed.  -Bring your insurance card, copy of an advanced directive if applicable, medication list.  -You should not be taking blood thinners or aspirin at least ten days prior to surgery unless instructed by your surgeon.  -STOP YOUR VITAMIN K AND OMEGA 3 AT LEAST 10 DAYS BEFORE SURGERY. Do not take supplements such as fish oil (omega 3), red yeast rice, turmeric before your surgery. You want to avoid medications with aspirin in them including headache powders such as BC or Goody's), Excedrin migraine.  Day Before Surgery at Home -You will be advised you can have clear liquids up until 3 hours before your surgery.    Your role in recovery Your role is to become active as soon as directed by your doctor, while still giving yourself time to heal.  Rest when you feel tired. You will be asked to do the following in order to speed your recovery:  - Cough and breathe deeply. This helps to clear and expand your lungs and can prevent pneumonia after surgery.  - STAY ACTIVE WHEN YOU GET HOME. Do mild physical activity. Walking or moving your legs help your circulation and body functions return to normal. Do not try to get up or walk alone the first time after surgery.   -If you develop swelling on one leg or the other, pain in the back of your leg, redness/warmth in one of your legs, please call the office or go to the Emergency Room to have a doppler to rule out a blood clot. For shortness of breath, chest pain-seek  care in the Emergency Room as soon as possible. - Actively manage your pain. Managing your pain lets you move in comfort. We will ask you to rate your pain on a scale of zero to 10. It is your responsibility to tell your doctor or nurse where and how much you hurt so your pain can be treated.  Special Considerations -Your final pathology results from surgery should be available around one week after surgery and the results will be relayed to you when available.  -FMLA forms can be faxed to [phone] and please allow 5-7 business days for completion.  Pain Management After Surgery -You will be prescribed your pain medication and bowel regimen medications before surgery so that you can have these available when you are discharged from the hospital. The pain medication is for use ONLY AFTER surgery and a new prescription will not be given.   -Make sure that you have Tylenol and Ibuprofen at home IF YOU ARE ABLE TO TAKE THESE MEDICATIONS to use on a regular basis after surgery for pain control. We recommend alternating the medications every hour to six hours since they work differently and are processed in the body differently for pain relief.  -Review the attached handout on narcotic use and their risks and side effects.   Bowel Regimen -You will be prescribed Sennakot-S to take nightly to prevent constipation especially if you are taking the narcotic pain medication intermittently.  It is important to prevent  constipation and drink adequate amounts of liquids. You can stop taking this medication when you are not taking pain medication and you are back on your normal bowel routine.  Risks of Surgery Risks of surgery are low but include bleeding, infection, damage to surrounding structures, re-operation, blood clots, and very rarely death.  AFTER SURGERY INSTRUCTIONS  Return to work:  2 weeks or less if applicable, BASED ON OCCUPATION  We recommend purchasing several bags of frozen green peas  and dividing them into ziploc bags. You will want to keep these in the freezer and have them ready to use as ice packs to the vulvar incision. Once the ice pack is no longer cold, you can get another from the freezer. The frozen peas mold to your body better than a regular ice pack.   Activity: 1. Be up and out of the bed during the day.  Take a nap if needed.  You may walk up steps but be careful and use the hand rail.  Stair climbing will tire you more than you think, you may need to stop part way and rest.   2. No lifting or straining for 4 weeks over 10 pounds. No pushing, pulling, straining for 4 weeks.  3. No driving for minimum 24 hours after surgery.  Do not drive if you are taking narcotic pain medicine and make sure that your reaction time has returned.   4. You can shower as soon as the next day after surgery. Shower daily. No tub baths or submerging your body in water until cleared by your surgeon. If you have the soap that was given to you by pre-surgical testing that was used before surgery, you do not need to use it afterwards because this can irritate your incisions.   5. No sexual activity and nothing in the vagina for 4-6 weeks.  6. You may experience vaginal spotting and discharge after surgery.  The spotting is normal but if you experience heavy bleeding, call our office.  7. Take Tylenol or ibuprofen first for pain if you are able to take these medications and only use narcotic pain medication for severe pain not relieved by the Tylenol or Ibuprofen.  Monitor your Tylenol intake to a max of 4,000 mg in a 24 hour period. You can alternate these medications after surgery.  Diet: 1. Low sodium Heart Healthy Diet is recommended but you are cleared to resume your normal (before surgery) diet after your procedure.  2. It is safe to use a laxative, such as Miralax or Colace, if you have difficulty moving your bowels. You have been prescribed Sennakot at bedtime every evening to keep  bowel movements regular and to prevent constipation.    Wound Care: 1. Keep clean and dry.  Shower daily.  Reasons to call the Doctor: Fever - Oral temperature greater than 100.4 degrees Fahrenheit Foul-smelling vaginal discharge Difficulty urinating Nausea and vomiting Increased pain at the site of the incision that is unrelieved with pain medicine. Difficulty breathing with or without chest pain New calf pain especially if only on one side Sudden, continuing increased vaginal bleeding with or without clots.   Contacts: For questions or concerns you should contact:  Dr. Eugene Garnet at [phone]  Warner Mccreedy, NP at [phone]  After Hours: call [phone] and have the GYN Oncologist paged/contacted (after 5 pm or on the weekends).  Messages sent via mychart are for non-urgent matters and are not responded to after hours so for urgent needs, please call the  after hours number.

## 2022-12-22 LAB — CERVICOVAGINAL ANCILLARY ONLY
Comment: NEGATIVE
High risk HPV: NEGATIVE

## 2022-12-30 ENCOUNTER — Encounter: Payer: Self-pay | Admitting: Gynecologic Oncology

## 2023-01-04 ENCOUNTER — Encounter: Payer: Self-pay | Admitting: Gynecologic Oncology

## 2023-01-05 NOTE — Progress Notes (Signed)
Anesthesia Review:  PCP: Saralyn Pilar LOV 11/19/22     Cardiologist : Chest x-ray : 01/14/22- 1 view  EKG : Echo : Stress test: Cardiac Cath :  Activity level:  Sleep Study/ CPAP : Fasting Blood Sugar :      / Checks Blood Sugar -- times a day:   Blood Thinner/ Instructions /Last Dose: ASA / Instructions/ Last Dose :    DM- type  Hgba1c-  11/19/22- 7.2  Metformin- none day of surgery Januvia-  Proep appt on 01/07/23.  At 1015 was told by Admitting pt was in Admitting.  Never arrived for preop appt.  Called and [facility] x 2 and asked pt to call back to reschedule preop appt.  PT called back and stated she had cancelled surgery and preop. Still on OR schedule.  Thanked pt for the call.  Call ob/GYN and made them awre.  They have not received any phone calls from pt cancelling.  They are to call pt.

## 2023-01-05 NOTE — Patient Instructions (Signed)
SURGICAL WAITING ROOM VISITATION  Patients having surgery or a procedure may have no more than 2 support people in the waiting area - these visitors may rotate.    Children under the age of 52 must have an adult with them who is not the patient.  Due to an increase in RSV and influenza rates and associated hospitalizations, children ages 7 and under may not visit patients in [facility] hospitals.  If the patient needs to stay at the hospital during part of their recovery, the visitor guidelines for inpatient rooms apply. Pre-op nurse will coordinate an appropriate time for 1 support person to accompany patient in pre-op.  This support person may not rotate.    Please refer to the [facility] website for the visitor guidelines for Inpatients (after your surgery is over and you are in a regular room).       Your procedure is scheduled on:  01/13/2023    Report to [facility] Main Entrance    Report to admitting at   [phone]   Call this number if you have problems the morning of surgery [phone]   Do not eat food :After Midnight.   After Midnight you may have the following liquids until __0530____ AM DAY OF SURGERY  Water Non-Citrus Juices (without pulp, NO RED-Apple, White grape, White cranberry) Black Coffee (NO MILK/CREAM OR CREAMERS, sugar ok)  Clear Tea (NO MILK/CREAM OR CREAMERS, sugar ok) regular and decaf                             Plain Jell-O (NO RED)                                           Fruit ices (not with fruit pulp, NO RED)                                     Popsicles (NO RED)                                                               Sports drinks like Gatorade (NO RED)                       If you have questions, please contact your surgeon's office.      Oral Hygiene is also important to reduce your risk of infection.                                    Remember - BRUSH YOUR TEETH THE MORNING OF SURGERY WITH YOUR REGULAR  TOOTHPASTE  DENTURES WILL BE REMOVED PRIOR TO SURGERY PLEASE DO NOT APPLY "Poly grip" OR ADHESIVES!!!   Do NOT smoke after Midnight   Stop all vitamins and herbal supplements 7 days before surgery.   Take these medicines the morning of surgery with A SIP OF WATER:  amlodiopine, levothyroxine, toprol  Metformin- none day of surgery              Januvia-   DO NOT TAKE ANY ORAL DIABETIC MEDICATIONS DAY OF YOUR SURGERY  Bring CPAP mask and tubing day of surgery.                              You may not have any metal on your body including hair pins, jewelry, and body piercing             Do not wear make-up, lotions, powders, perfumes/cologne, or deodorant  Do not wear nail polish including gel and S&S, artificial/acrylic nails, or any other type of covering on natural nails including finger and toenails. If you have artificial nails, gel coating, etc. that needs to be removed by a nail salon please have this removed prior to surgery or surgery may need to be canceled/ delayed if the surgeon/ anesthesia feels like they are unable to be safely monitored.   Do not shave  48 hours prior to surgery.               Men may shave face and neck.   Do not bring valuables to the hospital. [facility] IS NOT             RESPONSIBLE   FOR VALUABLES.   Contacts, glasses, dentures or bridgework may not be worn into surgery.   Bring small overnight bag day of surgery.   DO NOT BRING YOUR HOME MEDICATIONS TO THE HOSPITAL. PHARMACY WILL DISPENSE MEDICATIONS LISTED ON YOUR MEDICATION LIST TO YOU DURING YOUR ADMISSION IN THE HOSPITAL!    Patients discharged on the day of surgery will not be allowed to drive home.  Someone NEEDS to stay with you for the first 24 hours after anesthesia.   Special Instructions: Bring a copy of your healthcare power of attorney and living will documents the day of surgery if you haven't scanned them before.              Please read over the following fact  sheets you were given: IF YOU HAVE QUESTIONS ABOUT YOUR PRE-OP INSTRUCTIONS PLEASE CALL [phone]   If you received a COVID test during your pre-op visit  it is requested that you wear a mask when out in public, stay away from anyone that may not be feeling well and notify your surgeon if you develop symptoms. If you test positive for Covid or have been in contact with anyone that has tested positive in the last 10 days please notify you surgeon.    [facility] - Preparing for Surgery Before surgery, you can play an important role.  Because skin is not sterile, your skin needs to be as free of germs as possible.  You can reduce the number of germs on your skin by washing with CHG (chlorahexidine gluconate) soap before surgery.  CHG is an antiseptic cleaner which kills germs and bonds with the skin to continue killing germs even after washing. Please DO NOT use if you have an allergy to CHG or antibacterial soaps.  If your skin becomes reddened/irritated stop using the CHG and inform your nurse when you arrive at Short Stay. Do not shave (including legs and underarms) for at least 48 hours prior to the first CHG shower.  You may shave your face/neck. Please follow these instructions carefully:  1.  Shower with CHG Soap the night before surgery and  the  morning of Surgery.  2.  If you choose to wash your hair, wash your hair first as usual with your  normal  shampoo.  3.  After you shampoo, rinse your hair and body thoroughly to remove the  shampoo.                           4.  Use CHG as you would any other liquid soap.  You can apply chg directly  to the skin and wash                       Gently with a scrungie or clean washcloth.  5.  Apply the CHG Soap to your body ONLY FROM THE NECK DOWN.   Do not use on face/ open                           Wound or open sores. Avoid contact with eyes, ears mouth and genitals (private parts).                       Wash face,  Genitals (private parts) with  your normal soap.             6.  Wash thoroughly, paying special attention to the area where your surgery  will be performed.  7.  Thoroughly rinse your body with warm water from the neck down.  8.  DO NOT shower/wash with your normal soap after using and rinsing off  the CHG Soap.                9.  Pat yourself dry with a clean towel.            10.  Wear clean pajamas.            11.  Place clean sheets on your bed the night of your first shower and do not  sleep with pets. Day of Surgery : Do not apply any lotions/deodorants the morning of surgery.  Please wear clean clothes to the hospital/surgery center.  FAILURE TO FOLLOW THESE INSTRUCTIONS MAY RESULT IN THE CANCELLATION OF YOUR SURGERY PATIENT SIGNATURE_________________________________  NURSE SIGNATURE__________________________________  ________________________________________________________________________

## 2023-01-07 ENCOUNTER — Telehealth: Payer: Self-pay | Admitting: *Deleted

## 2023-01-07 ENCOUNTER — Encounter (HOSPITAL_COMMUNITY)
Admission: RE | Admit: 2023-01-07 | Discharge: 2023-01-07 | Disposition: A | Discharge status: Home / Self Care | Payer: BC Managed Care – PPO | Source: Ambulatory Visit | Attending: Family Medicine | Admitting: Family Medicine

## 2023-01-07 NOTE — Telephone Encounter (Signed)
Albin Felling, RN from pre-admission testing called the office to let us know that the patient didn't show for her pre-admission testing appointment at 1000 this morning. Carla notified the patient and Ms. Jiron responded saying, "I canceled my surgery" & "My pre-admission testing appointment".

## 2023-01-08 ENCOUNTER — Telehealth: Payer: Self-pay | Admitting: *Deleted

## 2023-01-08 NOTE — Telephone Encounter (Signed)
Spoke with Miranda Delacruz who said she is canceling her surgery date with Dr.Tucker. Pt states, "I am taking a different route and going with a dermatologist who will do my procedure outpatient in the office". Wished the patient well and advised her to keep the office posted.

## 2023-01-13 ENCOUNTER — Ambulatory Visit (HOSPITAL_COMMUNITY)
Admission: RE | Admit: 2023-01-13 | Payer: BC Managed Care – PPO | Source: Ambulatory Visit | Admitting: Gynecologic Oncology

## 2023-01-13 ENCOUNTER — Encounter (HOSPITAL_COMMUNITY): Admission: RE | Payer: Self-pay | Source: Ambulatory Visit

## 2023-01-13 ENCOUNTER — Encounter: Payer: Self-pay | Admitting: Family Medicine

## 2023-01-13 DIAGNOSIS — D071 Carcinoma in situ of vulva: Secondary | ICD-10-CM

## 2023-01-13 SURGERY — WIDE EXCISION VULVECTOMY
Anesthesia: General

## 2023-01-14 ENCOUNTER — Encounter: Payer: Self-pay | Admitting: Dermatology

## 2023-01-14 ENCOUNTER — Ambulatory Visit: Payer: BC Managed Care – PPO | Admitting: Dermatology

## 2023-01-14 VITALS — BP 131/82

## 2023-01-14 DIAGNOSIS — N903 Dysplasia of vulva, unspecified: Secondary | ICD-10-CM

## 2023-01-14 DIAGNOSIS — D071 Carcinoma in situ of vulva: Secondary | ICD-10-CM

## 2023-01-14 DIAGNOSIS — D485 Neoplasm of uncertain behavior of skin: Secondary | ICD-10-CM

## 2023-01-14 NOTE — Patient Instructions (Addendum)

## 2023-01-14 NOTE — Progress Notes (Signed)
   New Patient Visit   Subjective  Miranda Delacruz is a 62 y.o. 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 Area was biopsied by Dr Arman Filter and she was scheduled for outpatient surgery which was going to cost her $17,000. She would like a second opinion on her treatment options.  The following portions of the chart were reviewed this encounter and updated as appropriate: medications, allergies, medical history  Review of Systems:  No other skin or systemic complaints except as noted in HPI or Assessment and Plan.  Objective  Well appearing patient in no apparent distress; mood and affect are within normal limits.  A focused examination was performed of the following areas:  Groin area.  Relevant exam findings are noted in the Assessment and Plan.  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 Biopsy of remaining lesion performed today. - Discussed risks and benefits of Mohs surgery, including risks of bleeding, pain, numbness, scarring, and potential reconstruction including linear closures, grafts, flaps, delayed closure, or secondary intention - Discussed that Mohs surgery has the highest cure rate for treatment of NMSCs (99%) - Discussed expectations of day of surgery including local anesthesia, waiting for lab process to find out pathology results, and same day reconstruction   Neoplasm of uncertain behavior of skin Left inner vulva  Skin / nail biopsy Type of biopsy: tangential   Informed consent: discussed and consent obtained   Timeout: patient name, date of birth, surgical site, and procedure verified   Procedure prep:  Patient was prepped and draped in usual sterile fashion Prep type:  Isopropyl alcohol Anesthesia: the lesion was anesthetized in  a standard fashion   Anesthetic:  1% lidocaine w/ epinephrine 1-100,000 buffered w/ 8.4% NaHCO3 Instrument used: flexible razor blade   Hemostasis achieved with: pressure, aluminum chloride and electrodesiccation   Outcome: patient tolerated procedure well   Post-procedure details: sterile dressing applied and wound care instructions given   Dressing type: bandage and petrolatum    Specimen 1 - Surgical pathology Differential Diagnosis: MM vs DN vs Angiokeratoma vs SCC Check Margins: No Accession # [id number]     Return if symptoms worsen or fail to improve.  I, Joanie Coddington, CMA, am acting as scribe for Gwenith Daily, MD .    Documentation: I have reviewed the above documentation for accuracy and completeness, and I agree with the above.  Gwenith Daily, MD

## 2023-01-19 LAB — SURGICAL PATHOLOGY

## 2023-01-26 ENCOUNTER — Telehealth: Payer: Self-pay

## 2023-01-26 NOTE — Telephone Encounter (Signed)
Spoke with Mindi Junker at [facility] Pathology. Requested that Dr Juel Burrow read patient's slides from her original biopsy with her gynecologist. She will request the slides from the hospital and have Dr Juel Burrow read them and send an addendum report as soon as possible/hd

## 2023-02-11 ENCOUNTER — Encounter: Payer: Self-pay | Admitting: Dermatology

## 2023-02-11 NOTE — Progress Notes (Unsigned)
I spoke to the patient and went over her results.  I offered her the option to come in for a consult to go over treatment options or if she felt comfortable with the Mohs surgery that Dr. Caralyn Guile recommends we could move forward with scheduling.  The patient stated she was comfortable with moving forward with the surgery but needs to know the cost so she can determine her out of pocket cost.  I gave her information to [place] and she was going to give the patient billing codes that she can work with her insurance company to determine her out of pocket cost.

## 2023-02-15 ENCOUNTER — Other Ambulatory Visit: Payer: BC Managed Care – PPO

## 2023-02-15 DIAGNOSIS — E119 Type 2 diabetes mellitus without complications: Secondary | ICD-10-CM

## 2023-02-15 DIAGNOSIS — E1159 Type 2 diabetes mellitus with other circulatory complications: Secondary | ICD-10-CM

## 2023-02-15 DIAGNOSIS — I152 Hypertension secondary to endocrine disorders: Secondary | ICD-10-CM

## 2023-02-15 DIAGNOSIS — E1169 Type 2 diabetes mellitus with other specified complication: Secondary | ICD-10-CM

## 2023-02-16 LAB — COMPREHENSIVE METABOLIC PANEL
ALT: 85 [IU]/L — ABNORMAL HIGH (ref 0–32)
AST: 52 [IU]/L — ABNORMAL HIGH (ref 0–40)
Albumin: 4.6 g/dL (ref 3.8–4.9)
Alkaline Phosphatase: 75 [IU]/L (ref 44–121)
BUN/Creatinine Ratio: 13 (ref 12–28)
BUN: 8 mg/dL (ref 8–27)
Bilirubin Total: 1.1 mg/dL (ref 0.0–1.2)
CO2: 24 mmol/L (ref 20–29)
Calcium: 9.9 mg/dL (ref 8.7–10.3)
Chloride: 96 mmol/L (ref 96–106)
Creatinine, Ser: 0.62 mg/dL (ref 0.57–1.00)
Globulin, Total: 1.9 g/dL (ref 1.5–4.5)
Glucose: 260 mg/dL — ABNORMAL HIGH (ref 70–99)
Potassium: 4.2 mmol/L (ref 3.5–5.2)
Sodium: 138 mmol/L (ref 134–144)
Total Protein: 6.5 g/dL (ref 6.0–8.5)
eGFR: 102 mL/min/{1.73_m2} (ref >59)

## 2023-02-16 LAB — CBC WITH DIFFERENTIAL/PLATELET
Basophils Absolute: 0 10*3/uL (ref 0.0–0.2)
Basos: 0 %
EOS (ABSOLUTE): 0.1 10*3/uL (ref 0.0–0.4)
Eos: 2 %
Hematocrit: 45.2 % (ref 34.0–46.6)
Hemoglobin: 15.3 g/dL (ref 11.1–15.9)
Immature Grans (Abs): 0 10*3/uL (ref 0.0–0.1)
Immature Granulocytes: 1 %
Lymphocytes Absolute: 1.5 10*3/uL (ref 0.7–3.1)
Lymphs: 31 %
MCH: 31.4 pg (ref 26.6–33.0)
MCHC: 33.8 g/dL (ref 31.5–35.7)
MCV: 93 fL (ref 79–97)
Monocytes Absolute: 0.3 10*3/uL (ref 0.1–0.9)
Monocytes: 7 %
Neutrophils Absolute: 2.9 10*3/uL (ref 1.4–7.0)
Neutrophils: 59 %
Platelets: 203 10*3/uL (ref 150–450)
RBC: 4.88 x10E6/uL (ref 3.77–5.28)
RDW: 14.3 % (ref 11.7–15.4)
WBC: 4.9 10*3/uL (ref 3.4–10.8)

## 2023-02-16 LAB — HEMOGLOBIN A1C
Est. average glucose Bld gHb Est-mCnc: 186 mg/dL
Hgb A1c MFr Bld: 8.1 % — ABNORMAL HIGH (ref 4.8–5.6)

## 2023-02-16 LAB — LIPID PANEL
Chol/HDL Ratio: 6.9 {ratio} — ABNORMAL HIGH (ref 0.0–4.4)
Cholesterol, Total: 220 mg/dL — ABNORMAL HIGH (ref 100–199)
HDL: 32 mg/dL — ABNORMAL LOW (ref >39)
LDL Chol Calc (NIH): 89 mg/dL (ref 0–99)
Triglycerides: 600 mg/dL (ref 0–149)
VLDL Cholesterol Cal: 99 mg/dL — ABNORMAL HIGH (ref 5–40)

## 2023-02-18 ENCOUNTER — Encounter: Payer: BC Managed Care – PPO | Admitting: Gynecologic Oncology

## 2023-02-19 IMAGING — US US ABDOMEN COMPLETE
1 series · 13 of 25 positions shown · non-contrast
Comparison: 12/18/2014

CLINICAL DATA: Hepatic steatosis with persistently elevated liver
enzymes.

EXAM:
ABDOMEN ULTRASOUND COMPLETE

[Series 1: us abdomen complete · 0.31mm/px · 13 of 108 slices shown]
[im 1/108]
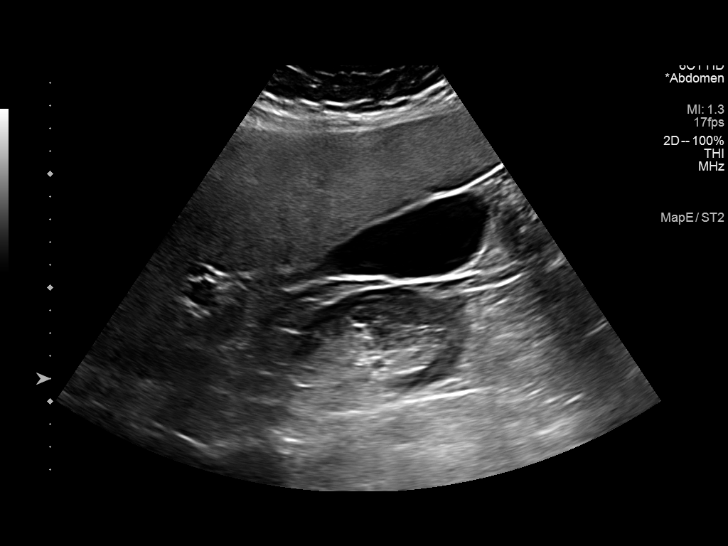
[im 9/108]
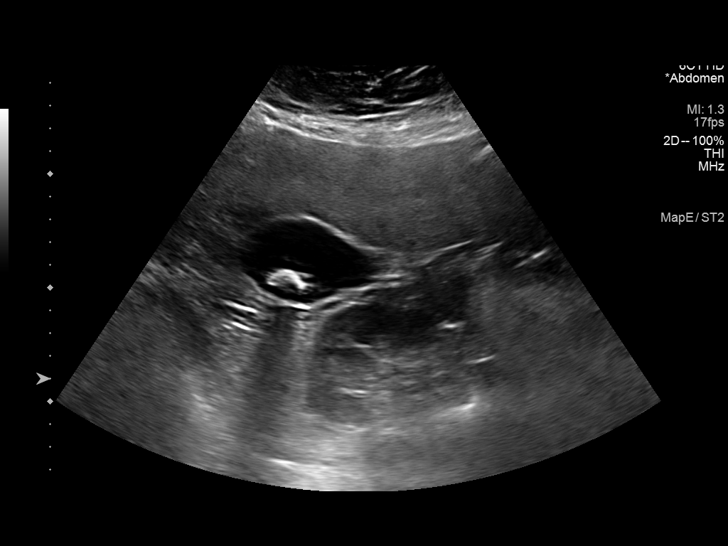
[im 18/108]
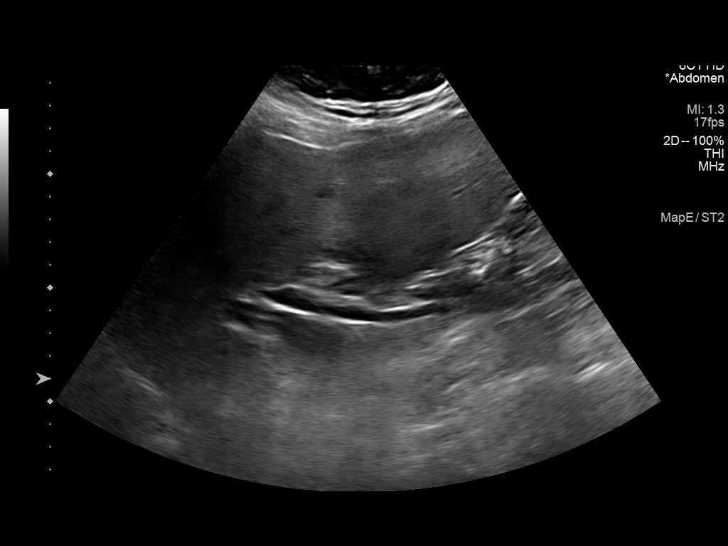
[im 27/108]
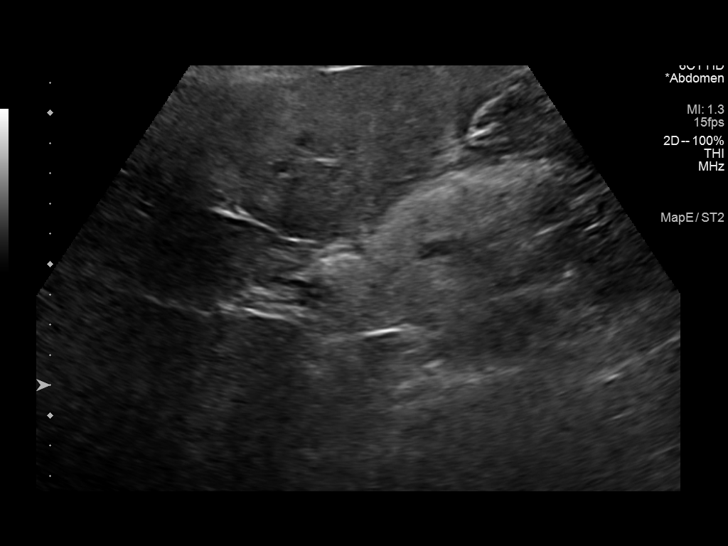
[im 36/108]
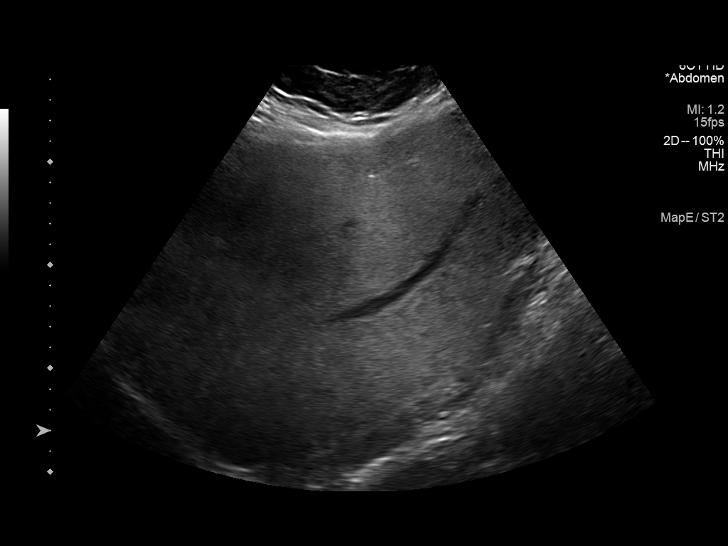
[im 45/108]
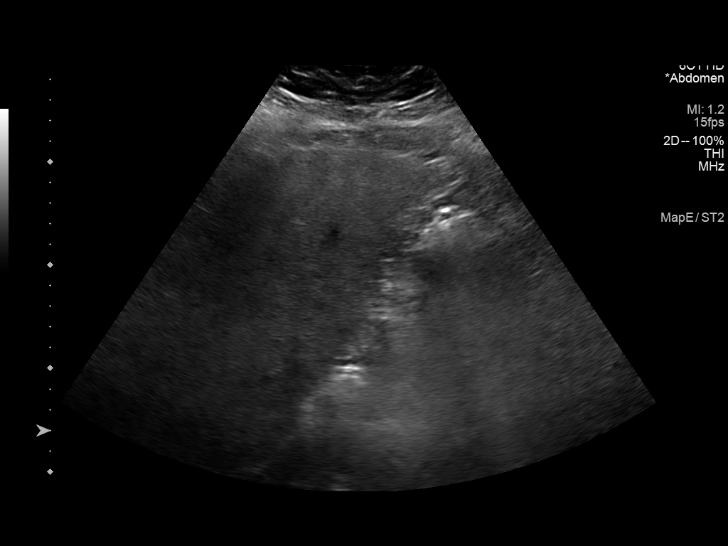
[im 54/108]
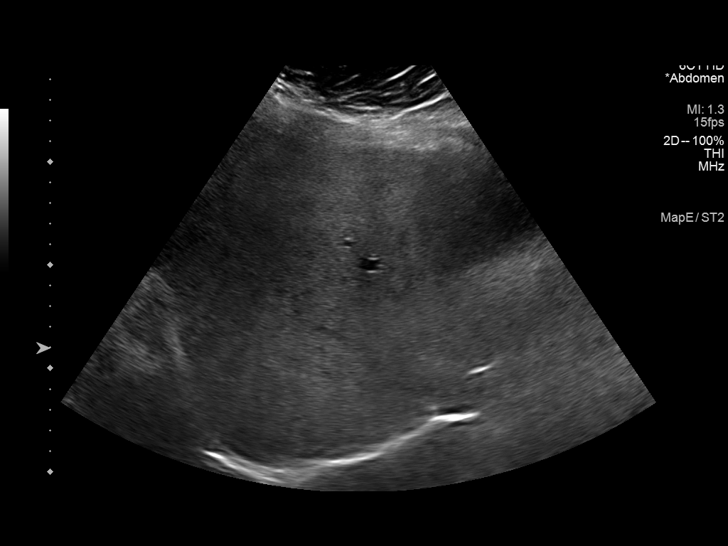
[im 63/108]
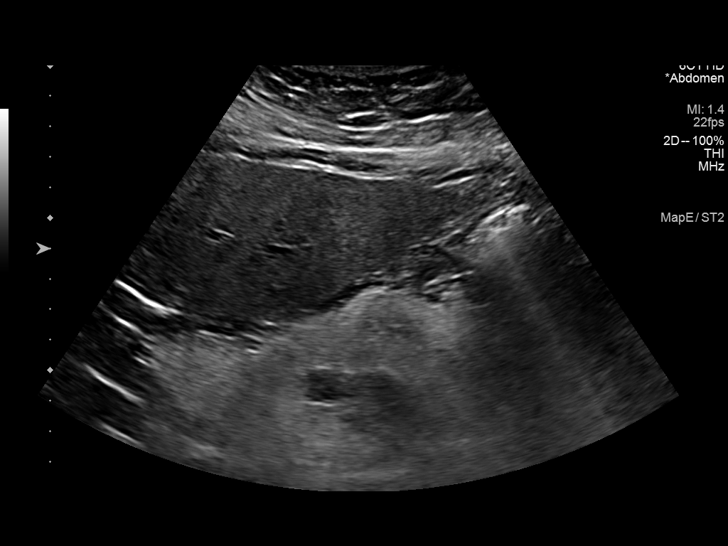
[im 72/108]
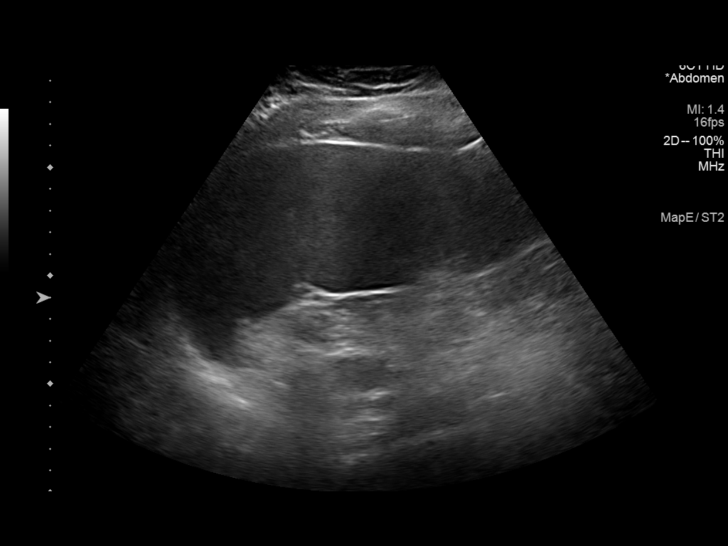
[im 81/108]
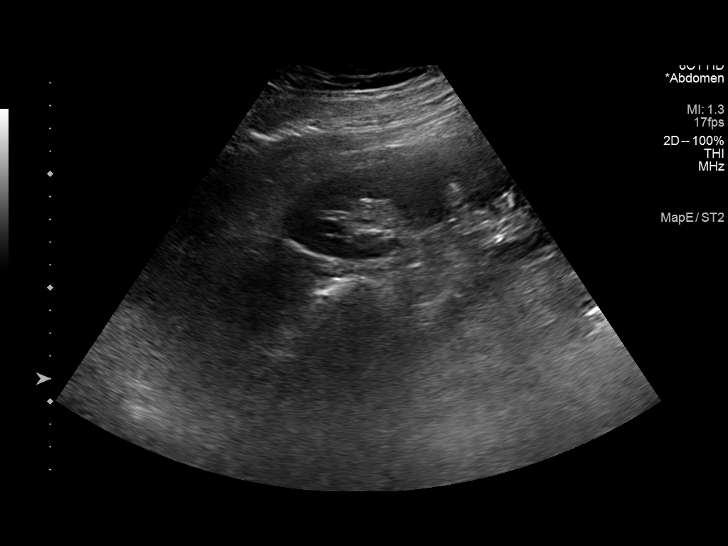
[im 90/108]
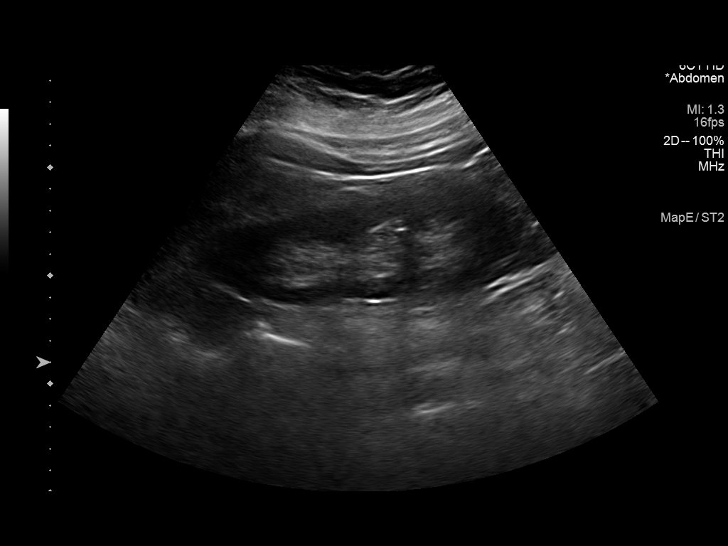
[im 99/108]
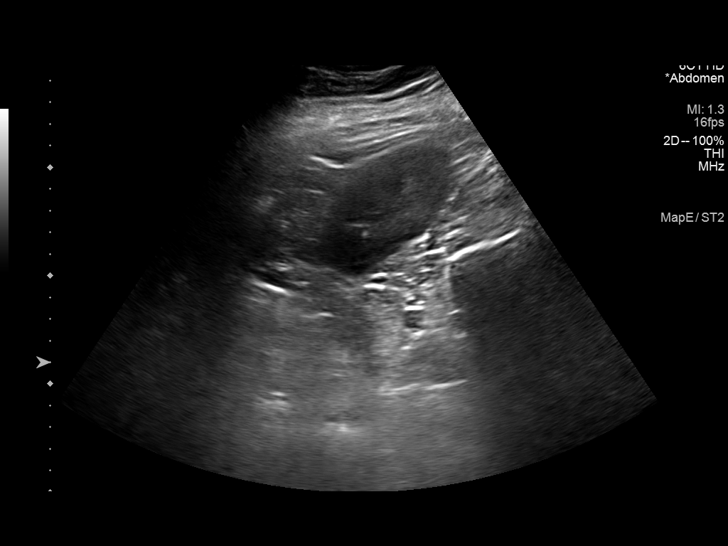
[im 108/108]
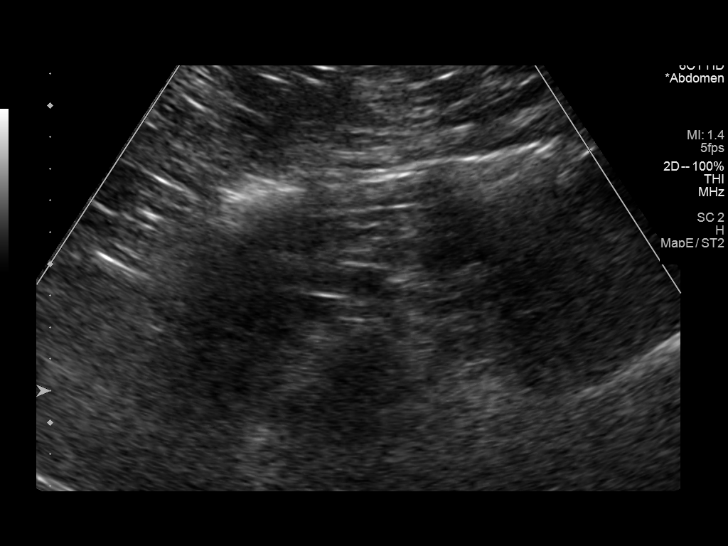

[13 of 25 positions shown; findings below may reference images not displayed]

FINDINGS: Gallbladder: Cholelithiasis without evidence of acute cholecystitis.
No sonographic Murphy sign noted by sonographer.

Common bile duct: Diameter: 9 mm, similar to ultrasound December 18, 2014

Liver: No focal lesion identified. Hepatomegaly. Coarsened hepatic
echotexture with diffusely increased parenchymal echogenicity and
areas of subtle contour nodularity. Portal vein is patent on color
Doppler imaging with normal direction of blood flow towards the
liver.

IVC: No abnormality visualized.

Pancreas: Visualized portion unremarkable.

Spleen: Splenomegaly measuring up to 17.7 cm with a volume of 1410
cc.

Right Kidney: Length: 13.6 cm. Echogenicity within normal limits. No
mass or hydronephrosis visualized.

Left Kidney: Length: 15.7 cm. Echogenicity within normal limits. No
mass or hydronephrosis visualized.

Abdominal aorta: No aneurysm visualized.

Other findings: None.
IMPRESSION: 1. Hepatic steatosis with hepatomegaly sonographic findings
suggestive of cirrhosis. No focal liver lesion identified.
2. Cholelithiasis without sonographic evidence of acute
cholecystitis.
3. Stable mild dilatation of the common bile duct measuring 9 mm,
given stability the finding is likely within normal limits for this
patient, consider further evaluation with laboratory values to
exclude biliary obstruction.
4. Splenomegaly which in the setting of cirrhosis would be
indicative of portal hypertension.

## 2023-02-22 ENCOUNTER — Ambulatory Visit: Payer: BC Managed Care – PPO | Admitting: Family Medicine

## 2023-02-22 VITALS — BP 118/78 | HR 76 | Temp 97.8°F | Ht 67.0 in | Wt 259.0 lb

## 2023-02-22 DIAGNOSIS — E1169 Type 2 diabetes mellitus with other specified complication: Secondary | ICD-10-CM | POA: Diagnosis not present

## 2023-02-22 DIAGNOSIS — E119 Type 2 diabetes mellitus without complications: Secondary | ICD-10-CM | POA: Diagnosis not present

## 2023-02-22 DIAGNOSIS — I152 Hypertension secondary to endocrine disorders: Secondary | ICD-10-CM

## 2023-02-22 DIAGNOSIS — E1159 Type 2 diabetes mellitus with other circulatory complications: Secondary | ICD-10-CM

## 2023-02-22 DIAGNOSIS — R5383 Other fatigue: Secondary | ICD-10-CM

## 2023-02-22 DIAGNOSIS — Z7984 Long term (current) use of oral hypoglycemic drugs: Secondary | ICD-10-CM

## 2023-02-22 DIAGNOSIS — E039 Hypothyroidism, unspecified: Secondary | ICD-10-CM

## 2023-02-22 DIAGNOSIS — E782 Mixed hyperlipidemia: Secondary | ICD-10-CM

## 2023-02-22 LAB — POCT UA - MICROALBUMIN
Creatinine, POC: 100 mg/dL
Microalbumin Ur, POC: 80 mg/L

## 2023-02-22 MED ORDER — SITAGLIPTIN PHOSPHATE 100 MG PO TABS: 100.0000 mg | ORAL_TABLET | Freq: Every day | ORAL | 1 refills | Status: DC

## 2023-02-22 MED ORDER — AMLODIPINE BESYLATE 5 MG PO TABS: 5.0000 mg | ORAL_TABLET | Freq: Every day | ORAL | 1 refills | Status: DC

## 2023-02-22 MED ORDER — METFORMIN HCL 500 MG PO TABS
1000.0000 mg | ORAL_TABLET | Freq: Two times a day (BID) | ORAL | 1 refills | Status: AC
Start: 2023-02-22 — End: ?

## 2023-02-22 MED ORDER — HYDROCHLOROTHIAZIDE 12.5 MG PO CAPS: 12.5000 mg | ORAL_CAPSULE | Freq: Every day | ORAL | 1 refills | Status: DC

## 2023-02-22 MED ORDER — LEVOTHYROXINE SODIUM 175 MCG PO TABS
175.0000 ug | ORAL_TABLET | Freq: Every day | ORAL | 1 refills | Status: DC
Start: 2023-02-22 — End: 2023-09-08

## 2023-02-22 MED ORDER — METOPROLOL SUCCINATE ER 100 MG PO TB24: ORAL_TABLET | ORAL | 1 refills | Status: AC

## 2023-02-22 MED ORDER — VALSARTAN 160 MG PO TABS: 160.0000 mg | ORAL_TABLET | Freq: Every day | ORAL | 1 refills | Status: AC

## 2023-02-22 NOTE — Assessment & Plan Note (Addendum)
Last lipid panel: LDL 89, HDL 32, triglycerides 600.  LDL has improved since last check, but triglycerides incredibly elevated and increased from 339 at last check.  Patient previously declined statin therapy, also concern due to elevated liver enzymes.  Continue prioritizing fiber and lean protein intake, limit trans/saturated fats.  Increase sitagliptin to 100 mg daily (max dose), hoping that improved glucose control will also improve triglyceride levels.  If needed in the future, consider referral to lipid clinic given need for improved cholesterol but history of hepatic steatosis.

## 2023-02-22 NOTE — Assessment & Plan Note (Addendum)
A1c increased significantly and is now 8.1.  Continue metformin 1000 mg twice daily, increase sitagliptin to 100 mg daily.  If A1c remains above goal of 7.0, consider restarting GLP-1 injection.  Patient planning on updating eye exam in the next few months.  Foot exam complete, elevated microalbumin/creatinine ratio secondary to hyperglycemia.

## 2023-02-22 NOTE — Progress Notes (Signed)
Established Patient Office Visit  Subjective   Patient ID: Miranda Delacruz, female    DOB: [birth year]  Age: 62 y.o. MRN: [id number]  Chief Complaint  Patient presents with   Follow-up    HPI Miranda Delacruz is a 62 y.o. female presenting today for follow up of hypertension, hyperlipidemia, diabetes.  She endorses daily fatigue, noting that she specifically has worsened postprandial fatigue.  She endorses weight gain as well.  Given the symptoms in addition to high insulin resistance with elevated C-peptide lab in March 2024, patient expresses concern for Cushing's disease or other more complicated endocrinologic disorder. Hypertension: Patient here for follow-up of elevated blood pressure. She is not exercising.   Pt denies chest pain, SOB, dizziness, edema, syncope, fatigue or heart palpitations. Taking amlodipine, hydrochlorothiazide, metoprolol, valsartan, reports excellent compliance with treatment. Denies side effects. Hyperlipidemia:  Currently consuming a general diet.  The 10-year ASCVD risk score (Arnett DK, et al., 2019) is: 11% Diabetes: denies hypoglycemic events, wounds or sores that are not healing well, increased thirst or urination. Denies vision problems, eye exam due.  She plans on having this done in the near future when her husband is at home and is able to drive her back from the appointment since she will need her eyes dilated. Taking metformin and sitagliptin as prescribed without any side effects.   Outpatient Medications Prior to Visit  Medication Sig   APPLE CIDER VINEGAR PO Take 1 Dose by mouth 3 (three) times daily with meals.   Bacillus Coagulans-Inulin (PROBIOTIC-PREBIOTIC PO) Take 1 capsule by mouth daily.   Cholecalciferol (VITAMIN D3) 125 MCG (5000 UT) CAPS Take 5,000 Units by mouth daily.   estradiol (ESTRACE VAGINAL) 0.1 MG/GM vaginal cream Place 1 Applicatorful vaginally at bedtime. Apply pea size amount to area and to the vagina weekly for 3 weeks then 3  times a week   fluticasone (FLONASE) 50 MCG/ACT nasal spray Place 2 sprays into both nostrils 2 (two) times daily as needed for allergies.   magnesium oxide (MAG-OX) 400 (240 Mg) MG tablet Take 400 mg by mouth daily.   milk thistle 175 MG tablet Take 175 mg by mouth daily.   Omega 3 1200 MG CAPS Take 1,200 mg by mouth daily.   ONETOUCH VERIO test strip 1 each daily.   oxymetazoline (AFRIN) 0.05 % nasal spray Place 1 spray into both nostrils 2 (two) times daily as needed for congestion.   senna-docusate (SENOKOT-S) 8.6-50 MG tablet Take 2 tablets by mouth at bedtime. For AFTER surgery, do not take if having diarrhea   traMADol (ULTRAM) 50 MG tablet Take 1 tablet (50 mg total) by mouth every 6 (six) hours as needed for severe pain (pain score 7-10). For AFTER surgery only, do not take and drive   vitamin k 409 MCG tablet Take 100 mcg by mouth daily.   [DISCONTINUED] amLODipine (NORVASC) 5 MG tablet Take 1 tablet (5 mg total) by mouth daily.   [DISCONTINUED] hydrochlorothiazide (MICROZIDE) 12.5 MG capsule Take 1 capsule (12.5 mg total) by mouth daily.   [DISCONTINUED] levothyroxine (EUTHYROX) 175 MCG tablet Take 1 tablet (175 mcg total) by mouth daily before breakfast.   [DISCONTINUED] metFORMIN (GLUCOPHAGE) 500 MG tablet Take 2 tablets (1,000 mg total) by mouth 2 (two) times daily with a meal.   [DISCONTINUED] metoprolol succinate (TOPROL-XL) 100 MG 24 hr tablet TAKE 1 TABLET BY MOUTH TWICE DAILY WITH MEALS OR  IMMEDIATELY  FOLLOWING  A  MEAL   [DISCONTINUED] sitaGLIPtin (JANUVIA) 50 MG tablet  Take 1 tablet (50 mg total) by mouth daily.   [DISCONTINUED] valsartan (DIOVAN) 160 MG tablet Take 1 tablet (160 mg total) by mouth daily.   No facility-administered medications prior to visit.    ROS Negative unless otherwise noted in HPI   Objective:     BP 118/78   Pulse 76   Temp 97.8 F (36.6 C) (Temporal)   Ht 5\' 7"  (1.702 m)   Wt 259 lb (117.5 kg)   SpO2 94%   BMI 40.57 kg/m   Physical  Exam Constitutional:      General: She is not in acute distress.    Appearance: Normal appearance.  HENT:     Head: Normocephalic and atraumatic.  Cardiovascular:     Rate and Rhythm: Normal rate and regular rhythm.     Heart sounds: No murmur heard.    No friction rub. No gallop.  Pulmonary:     Effort: Pulmonary effort is normal. No respiratory distress.     Breath sounds: No wheezing, rhonchi or rales.  Skin:    General: Skin is warm and dry.  Neurological:     Mental Status: She is alert and oriented to person, place, and time.    Diabetic Foot Exam - Simple   Simple Foot Form Diabetic Foot exam was performed with the following findings: Yes 02/22/2023 10:10 AM  Visual Inspection No deformities, no ulcerations, no other skin breakdown bilaterally: Yes Sensation Testing Intact to touch and monofilament testing bilaterally: Yes Pulse Check Posterior Tibialis and Dorsalis pulse intact bilaterally: Yes Comments    Results for orders placed or performed in visit on 02/22/23  POCT UA - Microalbumin  Result Value Ref Range   Microalbumin Ur, POC 80 mg/L   Creatinine, POC 100 mg/dL   Albumin/Creatinine Ratio, Urine, POC 30-300     Assessment & Plan:  Hypertension associated with diabetes (HCC) Assessment & Plan: BP goal <130/80. Stable, at goal. Continue amlodipine 5 mg daily, hydrochlorothiazide 12.5 mg daily, metoprolol succinate 100 mg daily, valsartan 160 mg daily.  CMP within normal limits with the exception of elevated AST and ALT, consistent with history of hepatic steatosis.  Will continue to monitor.  Orders: -     amLODIPine Besylate; Take 1 tablet (5 mg total) by mouth daily.  Dispense: 90 tablet; Refill: 1 -     hydroCHLOROthiazide; Take 1 capsule (12.5 mg total) by mouth daily.  Dispense: 90 capsule; Refill: 1 -     Metoprolol Succinate ER; TAKE 1 TABLET BY MOUTH TWICE DAILY WITH MEALS OR  IMMEDIATELY  FOLLOWING  A  MEAL  Dispense: 180 tablet; Refill: 1 -      Valsartan; Take 1 tablet (160 mg total) by mouth daily.  Dispense: 90 tablet; Refill: 1  Mixed diabetic hyperlipidemia associated with type 2 diabetes mellitus (HCC) Assessment & Plan: Last lipid panel: LDL 89, HDL 32, triglycerides 600.  LDL has improved since last check, but triglycerides incredibly elevated and increased from 339 at last check.  Patient previously declined statin therapy, also concern due to elevated liver enzymes.  Continue prioritizing fiber and lean protein intake, limit trans/saturated fats.  Increase sitagliptin to 100 mg daily (max dose), hoping that improved glucose control will also improve triglyceride levels.  If needed in the future, consider referral to lipid clinic given need for improved cholesterol but history of hepatic steatosis.   Type 2 diabetes mellitus without complication, without long-term current use of insulin (HCC) Assessment & Plan: A1c increased significantly and  is now 8.1.  Continue metformin 1000 mg twice daily, increase sitagliptin to 100 mg daily.  If A1c remains above goal of 7.0, consider restarting GLP-1 injection.  Patient planning on updating eye exam in the next few months.  Foot exam complete, collecting urine for microalbumin/creatinine ratio.  Orders: -     POCT UA - Microalbumin -     Ambulatory referral to Endocrinology -     metFORMIN HCl; Take 2 tablets (1,000 mg total) by mouth 2 (two) times daily with a meal.  Dispense: 180 tablet; Refill: 1 -     SITagliptin Phosphate; Take 1 tablet (100 mg total) by mouth daily.  Dispense: 90 tablet; Refill: 1  Acquired hypothyroidism -     Levothyroxine Sodium; Take 1 tablet (175 mcg total) by mouth daily before breakfast.  Dispense: 90 tablet; Refill: 1  Other fatigue -     Ambulatory referral to Endocrinology    Return in about 3 months (around 05/23/2023) for follow-up for HTN, HLD, DM, fasting blood work 1 week before.    Melida Quitter, PA

## 2023-02-22 NOTE — Assessment & Plan Note (Signed)
BP goal <130/80. Stable, at goal. Continue amlodipine 5 mg daily, hydrochlorothiazide 12.5 mg daily, metoprolol succinate 100 mg daily, valsartan 160 mg daily.  CMP within normal limits with the exception of elevated AST and ALT, consistent with history of hepatic steatosis.  Will continue to monitor.

## 2023-03-01 ENCOUNTER — Telehealth: Payer: Self-pay | Admitting: Dermatology

## 2023-03-01 NOTE — Telephone Encounter (Signed)
 I spoke to the patient last week and went over the results.  The patient wanted to know how much it would cost.  I gave her information to Carlena Sax who was going to give the patient some billing codes for her to check with her insurance.

## 2023-03-08 ENCOUNTER — Encounter: Payer: Self-pay | Admitting: Family Medicine

## 2023-03-09 NOTE — Telephone Encounter (Signed)
 Thank you :)

## 2023-03-09 NOTE — Telephone Encounter (Signed)
 Hi [name] - I think we discussed asking path to clarify margin status. Any update on this? Thank you

## 2023-03-11 ENCOUNTER — Encounter: Payer: Self-pay | Admitting: Oncology

## 2023-03-23 LAB — SURGICAL PATHOLOGY

## 2023-04-01 ENCOUNTER — Encounter: Payer: Self-pay | Admitting: Dermatology

## 2023-04-01 ENCOUNTER — Encounter: Payer: Self-pay | Admitting: Family Medicine

## 2023-04-05 ENCOUNTER — Encounter: Payer: Self-pay | Admitting: Dermatology

## 2023-04-05 ENCOUNTER — Ambulatory Visit: Payer: BC Managed Care – PPO | Admitting: Dermatology

## 2023-04-05 VITALS — BP 132/69 | HR 81 | Temp 98.2°F

## 2023-04-05 DIAGNOSIS — C519 Malignant neoplasm of vulva, unspecified: Secondary | ICD-10-CM | POA: Diagnosis not present

## 2023-04-05 DIAGNOSIS — C4492 Squamous cell carcinoma of skin, unspecified: Secondary | ICD-10-CM

## 2023-04-05 DIAGNOSIS — L821 Other seborrheic keratosis: Secondary | ICD-10-CM | POA: Diagnosis not present

## 2023-04-05 DIAGNOSIS — D280 Benign neoplasm of vulva: Secondary | ICD-10-CM | POA: Diagnosis not present

## 2023-04-05 DIAGNOSIS — D239 Other benign neoplasm of skin, unspecified: Secondary | ICD-10-CM

## 2023-04-05 DIAGNOSIS — L579 Skin changes due to chronic exposure to nonionizing radiation, unspecified: Secondary | ICD-10-CM | POA: Diagnosis not present

## 2023-04-05 MED ORDER — MUPIROCIN CALCIUM 2 % EX CREA: 1.0000 | TOPICAL_CREAM | Freq: Two times a day (BID) | CUTANEOUS | 0 refills | Status: DC

## 2023-04-05 NOTE — Patient Instructions (Signed)

## 2023-04-05 NOTE — Progress Notes (Signed)
 Follow-Up Visit   Subjective  Miranda Delacruz is a 63 y.o. 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 Fain Home and previously by Gynecology.   She also has some questions about dark lesions on her vulva, that have come up with time, asymptomatic and not previously treated  The following portions of the chart were reviewed this encounter and updated as appropriate: medications, allergies, medical history  Review of Systems:  No other skin or systemic complaints except as noted in HPI or Assessment and Plan.  Objective  Well appearing patient in no apparent distress; mood and affect are within normal limits.  A focused examination was performed of the following areas: Left inner vulva Relevant physical exam findings are noted in the Assessment and Plan.     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 No   Was the anticoagulation regimen changed prior to Mohs? No    Procedure Details: Timeout: pre-procedure verification complete Procedure Prep: patient was prepped and draped in usual sterile fashion Prep type: chlorhexidine  Biopsy accession number: [id number] Biopsy lab: [facility] path Frozen section biopsy performed: No   Specimen debulked: Yes   Pre-Op diagnosis: squamous cell carcinoma SCC subtype: KA type MohsAIQ Surgical site (if tumor spans multiple areas, please select predominant area): anogenital Surgery side: left Surgical site (from skin exam): left inner vulva Pre-operative length (cm): 1.5 Pre-operative width (cm): 1 Indications for Mohs surgery: anatomic location where tissue conservation is critical Previously treated? No    Micrographic Surgery Details: Post-operative length (cm): 2.8 Post-operative width (cm):  2.5 Number of Mohs stages: 1 Is this a complex case (associate members only): No    Stage 1    Tumor features identified on Mohs section: no tumor identified    Depth of defect after stage: subcutaneous fat  Patient tolerance of procedure: tolerated well, no immediate complications  Reconstruction: Was the defect reconstructed?: No    Opioids: Did the patient receive a prescription for opioid/narcotic related to Mohs surgery? Yes   Indications for opioid/narcotics: patient required additional pain relief despite trial of non-opioid analgesia  Antibiotics: Does patient meet AHA guidelines for endocarditis?: No   Does patient meet AHA guidelines for orthopedic prophylaxis?: No   Were antibiotics given on the day of surgery? Yes   When were antibiotics given? post-operative Did surgery breach mucosa, expose cartilage/bone, involve an area of lymphedema/inflamed/infected tissue? No   Indication for post-operative antibiotics: anatomic location ANGIOKERATOMA    Angiokeratomas- Vulva - 1-2 mm smooth symmetric violet to black papules without features suspicious for malignancy on dermoscopy - Benign-appearing.  Observation.  Call clinic for new or changing lesions.    Return in about 4 weeks (around 05/03/2023).   04/05/2023  HISTORY OF PRESENT ILLNESS  Miranda Delacruz is seen in consultation at the request of Dr. Fain Home for biopsy-proven SCCIS on the left vulva. . They note that the area has been present for about 6 months increasing in size with time.  There is no history of previous treatment.  Reports no other new or changing lesions and has no other complaints today.  Medications and allergies: see patient chart.  Review of systems: Reviewed 8 systems and notable for the above skin cancer.  All other systems reviewed are unremarkable/negative, unless noted in the HPI. Past medical history,  surgical history, family history, social history were also reviewed and are noted in the  chart/questionnaire.    PHYSICAL EXAMINATION  General: Well-appearing, in no acute distress, alert and oriented x 4. Vitals reviewed in chart (if available).   Skin: Exam reveals a 1.5 x 1.0 cm erythematous papule and biopsy scar on the left inner vulva. There are rhytids, telangiectasias, and lentigines, consistent with photodamage.  Biopsy report(s) reviewed, confirming the diagnosis.   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 Due to location, size, histology, or recurrence and the likelihood of subclinical extension as well as the need to conserve normal surrounding tissue, the patient was deemed acceptable for Mohs micrographic surgery (MMS).  The nature and purpose of the procedure, associated benefits and risks including recurrence and scarring, possible complications such as pain, infection, and bleeding, and alternative methods of treatment if appropriate were discussed with the patient during consent. The lesion location was verified by the patient, by reviewing previous notes, pathology reports, and by photographs as well as angulation measurements if available.  Informed consent was reviewed and signed by the patient, and timeout was performed at 9:45 AM. See op note below.  2. For the photodamage and solar lentigines, sun protection discussed/information given on OTC sunscreens, and we recommend continued regular follow-up with primary dermatologist every 6 months or sooner for any growing, bleeding, or changing lesions. 3. Prognosis and future surveillance discussed. 4. Letter with treatment outcome sent to referring provider. 5. Pain acetaminophen /ibuprofen   MOHS MICROGRAPHIC SURGERY AND RECONSTRUCTION  Initial size:   1.5 x 1.0 cm Surgical defect/wound size: 2.8 x 2.5 cm Anesthesia:    0.33% lidocaine  with 1:200,000 epinephrine  EBL:    <5 mL Complications:  None Repair type:   Secondary  Intention  Stages: 1  STAGE I: Anesthesia achieved with 0.5% lidocaine  with 1:200,000 epinephrine . ChloraPrep applied. 1 section(s) excised using Mohs technique (this includes total peripheral and deep tissue margin excision and evaluation with frozen sections, excised and interpreted by the same physician). The tumor was first debulked and then excised with an approx. 2mm margin.  Hemostasis was achieved with electrocautery as needed.  The specimen was then oriented, subdivided/relaxed, inked, and processed using Mohs technique.    Frozen section analysis revealed a clear deep and peripheral margin.  Reconstruction  Patient was notified of results and repair options were discussed, including second intention healing. After reviewing the advantages and disadvantages of each, we agreed on second intention healing as appropriate.   The surgical site was then lightly scrubbed with sterile, saline-soaked gauze.  The area was bandaged using Vaseline ointment, non-adherent gauze, gauze pads, and tape to provide an adequate pressure dressing.   The patient tolerated the procedure well, was given detailed written and verbal wound care instructions, and was discharged in good condition.  The patient will follow-up in 4 weeks and as scheduled with primary dermatologist.  Documentation: I have reviewed the above documentation for accuracy and completeness, and I agree with the above.  Deneise Finlay, MD

## 2023-04-06 ENCOUNTER — Encounter: Payer: Self-pay | Admitting: Dermatology

## 2023-04-06 ENCOUNTER — Other Ambulatory Visit: Payer: Self-pay

## 2023-04-06 MED ORDER — MUPIROCIN 2 % EX OINT: 1.0000 | TOPICAL_OINTMENT | Freq: Two times a day (BID) | CUTANEOUS | 2 refills | Status: DC

## 2023-04-06 NOTE — Progress Notes (Signed)
Changed from cream to oin

## 2023-04-29 ENCOUNTER — Encounter: Payer: Self-pay | Admitting: Dermatology

## 2023-04-29 ENCOUNTER — Ambulatory Visit: Payer: BC Managed Care – PPO | Admitting: Dermatology

## 2023-04-29 DIAGNOSIS — C4492 Squamous cell carcinoma of skin, unspecified: Secondary | ICD-10-CM

## 2023-04-29 DIAGNOSIS — T1490XD Injury, unspecified, subsequent encounter: Secondary | ICD-10-CM

## 2023-04-29 DIAGNOSIS — Z86007 Personal history of in-situ neoplasm of skin: Secondary | ICD-10-CM | POA: Diagnosis not present

## 2023-04-29 DIAGNOSIS — S3140XD Unspecified open wound of vagina and vulva, subsequent encounter: Secondary | ICD-10-CM | POA: Diagnosis not present

## 2023-04-29 DIAGNOSIS — L539 Erythematous condition, unspecified: Secondary | ICD-10-CM | POA: Diagnosis not present

## 2023-04-29 NOTE — Progress Notes (Signed)
   Follow Up Visit   Subjective  Miranda Delacruz is a 63 y.o. female who presents for the following: follow up from Mohs surgery   The patient presents for follow up from Mohs surgery for a CIS on the left inner vulva, treated on 04/05/23,healing by second intention. The patient has been bandaging the wound as directed. The endorse the following concerns: none  The following portions of the chart were reviewed this encounter and updated as appropriate: medications, allergies, medical history  Review of Systems:  No other skin or systemic complaints except as noted in HPI or Assessment and Plan.  Objective  Well appearing patient in no apparent distress; mood and affect are within normal limits.  A full examination was performed including scalp, head, face and vagina. All findings within normal limits unless otherwise noted below.  Healing wound with mild erythema  Relevant physical exam findings are noted in the Assessment and Plan.    Assessment & Plan   Healing s/p Mohs for CIS, treated on 04/05/23, healing by second intention. - Reassured that wound is healing well - No evidence of infection - No swelling, induration, purulence, dehiscence, or tenderness out of proportion to the clinical exam, see photo above - Discussed that scars take up to 12 months to mature from the date of surgery - Recommend SPF 30+ to scar daily to prevent purple color from UV exposure during scar maturation process - Discussed that erythema and raised appearance of scar will fade over the next 4-6 months - OK to start scar massage at 4-6 weeks post-op - Can consider silicone based products for scar healing starting at 6 weeks post-op - Ok to continue ointment daily to wound under a bandage for another 3-4 weeks or until completely healed.  HISTORY OF SQUAMOUS CELL CARCINOMA IN SITU OF THE SKIN - No evidence of recurrence today - Recommend regular full body skin exams - Recommend daily broad spectrum  sunscreen SPF 30+ to sun-exposed areas, reapply every 2 hours as needed.  - Call if any new or changing lesions are noted between office visits  Return in about 4 weeks (around 05/27/2023) for F/U for second intention.  I, Tillie Fantasia, CMA, am acting as scribe for Gwenith Daily, MD.   Documentation: I have reviewed the above documentation for accuracy and completeness, and I agree with the above.  Gwenith Daily, MD

## 2023-04-29 NOTE — Patient Instructions (Signed)

## 2023-05-26 ENCOUNTER — Other Ambulatory Visit: Payer: BC Managed Care – PPO

## 2023-06-01 ENCOUNTER — Ambulatory Visit: Admitting: Dermatology

## 2023-06-01 ENCOUNTER — Encounter: Payer: Self-pay | Admitting: Dermatology

## 2023-06-01 VITALS — BP 119/85

## 2023-06-01 DIAGNOSIS — L905 Scar conditions and fibrosis of skin: Secondary | ICD-10-CM

## 2023-06-01 DIAGNOSIS — S3140XD Unspecified open wound of vagina and vulva, subsequent encounter: Secondary | ICD-10-CM

## 2023-06-01 DIAGNOSIS — C4492 Squamous cell carcinoma of skin, unspecified: Secondary | ICD-10-CM

## 2023-06-01 DIAGNOSIS — Z86007 Personal history of in-situ neoplasm of skin: Secondary | ICD-10-CM

## 2023-06-01 NOTE — Progress Notes (Signed)
   Follow-Up Visit   Subjective  Miranda Delacruz is a 63 y.o. female who presents for the following: History of SCC of left inner vulva - Mohs 04/05/23.   The following portions of the chart were reviewed this encounter and updated as appropriate: medications, allergies, medical history  Review of Systems:  No other skin or systemic complaints except as noted in HPI or Assessment and Plan.  Objective  Well appearing patient in no apparent distress; mood and affect are within normal limits.  A focused examination was performed of the following areas: genital area  Relevant exam findings are noted in the Assessment and Plan.     Assessment & Plan   Healing s/p Mohs for CIS, treated on 04/05/23, healing by second intention. - Well healed - No evidence of infection - No swelling, induration, purulence, dehiscence, or tenderness out of proportion to the clinical exam, see photo above - Discussed that scars take up to 12 months to mature from the date of surgery - Recommend SPF 30+ to scar daily to prevent purple color from UV exposure during scar maturation process - Discussed that erythema and raised appearance of scar will fade over the next 4-6 months - Ok to continue ointment daily to wound under a bandage for another 3-4 weeks or until completely healed.  HISTORY OF SQUAMOUS CELL CARCINOMA IN SITU OF THE SKIN - No evidence of recurrence today - Recommend regular full body skin exams - Recommend daily broad spectrum sunscreen SPF 30+ to sun-exposed areas, reapply every 2 hours as needed.  - Call if any new or changing lesions are noted between office visits - Recommend every 6 month skin checks and recommend following up with Gynecology   Return in about 6 months (around 12/01/2023) for TBSE.  I, Joanie Coddington, CMA, am acting as scribe for Gwenith Daily, MD .   Documentation: I have reviewed the above documentation for accuracy and completeness, and I agree with the  above.  Gwenith Daily, MD

## 2023-06-01 NOTE — Patient Instructions (Signed)

## 2023-06-02 ENCOUNTER — Ambulatory Visit: Payer: BC Managed Care – PPO | Admitting: Family Medicine

## 2023-07-21 ENCOUNTER — Telehealth: Payer: Self-pay

## 2023-07-21 NOTE — Telephone Encounter (Signed)
 Calling to schedule follow-up for HTN, HLD, DM, fasting blood work 1 week before.

## 2023-08-06 ENCOUNTER — Telehealth: Payer: Self-pay | Admitting: Obstetrics and Gynecology

## 2023-08-06 DIAGNOSIS — E2839 Other primary ovarian failure: Secondary | ICD-10-CM

## 2023-08-06 NOTE — Telephone Encounter (Signed)
 Bone scan reminder

## 2023-08-07 ENCOUNTER — Other Ambulatory Visit: Payer: Self-pay | Admitting: Family Medicine

## 2023-08-07 DIAGNOSIS — E119 Type 2 diabetes mellitus without complications: Secondary | ICD-10-CM

## 2023-09-08 ENCOUNTER — Encounter: Payer: Self-pay | Admitting: Family Medicine

## 2023-09-08 ENCOUNTER — Ambulatory Visit: Admitting: Family Medicine

## 2023-09-08 VITALS — BP 131/82 | HR 65 | Ht 67.0 in | Wt 251.8 lb

## 2023-09-08 DIAGNOSIS — E119 Type 2 diabetes mellitus without complications: Secondary | ICD-10-CM | POA: Diagnosis not present

## 2023-09-08 DIAGNOSIS — I152 Hypertension secondary to endocrine disorders: Secondary | ICD-10-CM | POA: Diagnosis not present

## 2023-09-08 DIAGNOSIS — E039 Hypothyroidism, unspecified: Secondary | ICD-10-CM

## 2023-09-08 DIAGNOSIS — E1159 Type 2 diabetes mellitus with other circulatory complications: Secondary | ICD-10-CM | POA: Diagnosis not present

## 2023-09-08 DIAGNOSIS — Z7984 Long term (current) use of oral hypoglycemic drugs: Secondary | ICD-10-CM

## 2023-09-08 DIAGNOSIS — G43109 Migraine with aura, not intractable, without status migrainosus: Secondary | ICD-10-CM

## 2023-09-08 MED ORDER — METFORMIN HCL ER 500 MG PO TB24: 1000.0000 mg | ORAL_TABLET | Freq: Two times a day (BID) | ORAL | 1 refills | Status: DC

## 2023-09-08 MED ORDER — LEVOTHYROXINE SODIUM 175 MCG PO TABS: 175.0000 ug | ORAL_TABLET | Freq: Every day | ORAL | 1 refills | Status: AC

## 2023-09-08 NOTE — Assessment & Plan Note (Signed)
 Well-controlled with metoprolol . - Continue current management.

## 2023-09-08 NOTE — Assessment & Plan Note (Signed)
 History of type 2 diabetes, previously managed by endocrinology, now managed here. Previously on metformin  1000mg  BID but had significant diarrhea. Self-restarted at 500mg  BID with improved tolerance, especially when taken with food. Reports elevated morning glucose off medication, which improved upon restarting. Denies use of sitagliptin . Previous successful use of Mounjaro , stopped due to cost. Exam today shows intact sensation in feet. - Switch from metformin  to metformin  XR 500mg  tablets. Will send prescription for 2 tablets BID, with instructions to titrate back up to this dose as tolerated. Counseled that XR formulation has fewer GI side effects and can be dosed flexibly (e.g., all at once, or split doses). - Labs ordered: A1C, urine microalbumin. - Follow up in 3 months. - Performed annual diabetic foot exam.

## 2023-09-08 NOTE — Progress Notes (Unsigned)
 Established Patient Office Visit  Subjective   Patient ID: Miranda Delacruz, female    DOB: [birth year]  Age: 63 y.o. MRN: [id number]  Chief Complaint  Patient presents with   Cough    HPI  Subjective - Reports a recent cough associated with postnasal drip, which has now resolved. Attributes this to seasonal allergies, which have developed since moving to the area in 2016. Uses over-the-counter medications like cetirizine and diphenhydramine  as needed for allergies, which provide relief. - Discussed type 2 diabetes management. Previously was taking metformin  1000mg  twice daily but self-discontinued for a period due to significant diarrhea. Has restarted on their own at a dose of 500mg  twice daily and notes taking it after meals helps with side effects. Morning blood sugars were over 300 when off metformin  but have dropped significantly since restarting. - Reports stopping sitagliptin  (Januvia ) due to concerns about increasing insulin  resistance. - Previously took Mounjaro  (tirzepatide ) prescribed by an endocrinologist, which was effective. Stopped due to insurance coverage changes, which increased the cost to an unaffordable amount, even with a manufacturer coupon ($95). Expresses interest in restarting a similar medication if affordable. Denies ever taking Ozempic . 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 Attributes it to a back issue. Denies numbness or tingling in the thigh.  Medications Current medications include levothyroxine  175 mcg daily for hypothyroidism. Metformin  500mg  twice daily for type 2 diabetes. Metoprolol  for migraines, which also helps with blood pressure. Valsartan , amlodipine  5mg  daily, and hydrochlorothiazide  (HCTZ) 12.5mg  daily for hypertension. Takes valsartan  and amlodipine  at night due to sleepiness and HCTZ in the morning. Is not currently using Estrace  vaginal cream and is unsure of the original  indication.  PMH, PSH, FH, Social Hx PMH: Hypothyroidism, Type 2 Diabetes Mellitus, Hypertension, Migraines, Seasonal Allergies. No longer sees an endocrinologist for diabetes management.  ROS Constitutional: Denies current cough. 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 Denies numbness or tingling in the thigh. GU: No new complaints. Unsure about indication for prior Estrace  cream prescription.  The 10-year ASCVD risk score (Arnett DK, et al., 2019) is: 14.3%  Health Maintenance Due  Topic Date Due   COVID-19 Vaccine (1) Never done   Pneumococcal Vaccine 61-58 Years old (2 of 2 - PPSV23, PCV20, or PCV21) 01/26/2017   OPHTHALMOLOGY EXAM  01/24/2020   Colonoscopy  05/21/2023   HEMOGLOBIN A1C  08/16/2023      Objective:     BP 131/82   Pulse 65   Ht 5' 7 (1.702 m)   Wt 251 lb 12.8 oz (114.2 kg)   SpO2 97%   BMI 39.44 kg/m    Physical Exam General: Alert, oriented Pulmonary: No respiratory distress Extremities: Normal monofilament testing.  Normal DP pulses bilaterally.   No results found for any visits on 09/08/23.      Assessment & Plan:   Type 2 diabetes mellitus without complication, without long-term current use of insulin  [facility]) Assessment & Plan: History of type 2 diabetes, previously managed by endocrinology, now managed here. Previously on metformin  1000mg  BID but had significant diarrhea. Self-restarted at 500mg  BID with improved tolerance, especially when taken with food. Reports elevated morning glucose off medication, which improved upon restarting. Denies use of sitagliptin . Previous successful use of Mounjaro , stopped due to cost. Exam today shows intact sensation in feet. - Switch from metformin  to metformin  XR 500mg  tablets. Will send prescription for 2 tablets BID, with instructions to titrate back up to  this dose as tolerated. Counseled that XR formulation has fewer GI side effects and can be dosed flexibly (e.g., all at once, or split  doses). - Labs ordered: A1C, urine microalbumin. - Follow up in 3 months. - Performed annual diabetic foot exam.  Orders: -     CBC with Differential/Platelet -     Comprehensive metabolic panel with GFR -     Lipid panel -     Hemoglobin A1c -     Microalbumin / creatinine urine ratio  Acquired hypothyroidism Assessment & Plan: Stable on levothyroxine  175 mcg daily. - Labs ordered: TSH. - Continue current management.  Orders: -     Levothyroxine  Sodium; Take 1 tablet (175 mcg total) by mouth daily before breakfast.  Dispense: 90 tablet; Refill: 1 -     TSH -     CBC with Differential/Platelet  Hypertension associated with diabetes (HCC) Assessment & Plan: Well-controlled on valsartan , amlodipine , and HCTZ. Discussed combination pill but will continue separate pills as they take the valsartan  and amlodipine  at night and HCTZ in the morning to manage side effects (sleepiness) and nocturia. - Continue current management.   Migraine with aura and without status migrainosus, not intractable Assessment & Plan: Well-controlled with metoprolol . - Continue current management.   Other orders -     metFORMIN  HCl ER; Take 2 tablets (1,000 mg total) by mouth 2 (two) times daily with a meal.  Dispense: 360 tablet; Refill: 1     Return in about 3 months (around 12/09/2023) for DM.    Toribio MARLA Slain, MD

## 2023-09-08 NOTE — Assessment & Plan Note (Signed)
 Stable on levothyroxine  175 mcg daily. - Labs ordered: TSH. - Continue current management.

## 2023-09-08 NOTE — Patient Instructions (Signed)
 It was nice to see you today,  We addressed the following topics today: -I am ordering some lab test today.  I will let you know the results when I get them - I have sent in metformin  XR for you to take instead of your metformin .  It is generally better tolerated than the regular metformin .  I would like you to try to work yourself back up to 4 tablets a day.  You can do this however you want, 2 in the morning and 2 in the evening, 1 in the morning 3 in the evening, etc.   Have a great day,  Rolan Slain, MD

## 2023-09-08 NOTE — Assessment & Plan Note (Signed)
 Well-controlled on valsartan , amlodipine , and HCTZ. Discussed combination pill but will continue separate pills as they take the valsartan  and amlodipine  at night and HCTZ in the morning to manage side effects (sleepiness) and nocturia. - Continue current management.

## 2023-09-09 ENCOUNTER — Other Ambulatory Visit: Payer: Self-pay | Admitting: Family Medicine

## 2023-09-09 ENCOUNTER — Ambulatory Visit: Payer: Self-pay | Admitting: Family Medicine

## 2023-09-09 LAB — TSH: TSH: 1.72 u[IU]/mL (ref 0.450–4.500)

## 2023-09-09 LAB — COMPREHENSIVE METABOLIC PANEL WITH GFR
ALT: 39 IU/L — ABNORMAL HIGH (ref 0–32)
AST: 31 IU/L (ref 0–40)
Albumin: 4.9 g/dL (ref 3.9–4.9)
Alkaline Phosphatase: 79 IU/L (ref 44–121)
BUN/Creatinine Ratio: 15 (ref 12–28)
BUN: 10 mg/dL (ref 8–27)
Bilirubin Total: 0.8 mg/dL (ref 0.0–1.2)
CO2: 22 mmol/L (ref 20–29)
Calcium: 10.2 mg/dL (ref 8.7–10.3)
Chloride: 96 mmol/L (ref 96–106)
Creatinine, Ser: 0.67 mg/dL (ref 0.57–1.00)
Globulin, Total: 1.8 g/dL (ref 1.5–4.5)
Glucose: 278 mg/dL — ABNORMAL HIGH (ref 70–99)
Potassium: 4 mmol/L (ref 3.5–5.2)
Sodium: 137 mmol/L (ref 134–144)
Total Protein: 6.7 g/dL (ref 6.0–8.5)
eGFR: 99 mL/min/1.73 (ref >59)

## 2023-09-09 LAB — CBC WITH DIFFERENTIAL/PLATELET
Basophils Absolute: 0 x10E3/uL (ref 0.0–0.2)
Basos: 1 %
EOS (ABSOLUTE): 0.2 x10E3/uL (ref 0.0–0.4)
Eos: 3 %
Hematocrit: 46.8 % — ABNORMAL HIGH (ref 34.0–46.6)
Hemoglobin: 15.2 g/dL (ref 11.1–15.9)
Immature Grans (Abs): 0 x10E3/uL (ref 0.0–0.1)
Immature Granulocytes: 0 %
Lymphocytes Absolute: 2 x10E3/uL (ref 0.7–3.1)
Lymphs: 35 %
MCH: 29.4 pg (ref 26.6–33.0)
MCHC: 32.5 g/dL (ref 31.5–35.7)
MCV: 91 fL (ref 79–97)
Monocytes Absolute: 0.4 x10E3/uL (ref 0.1–0.9)
Monocytes: 7 %
Neutrophils Absolute: 3.2 x10E3/uL (ref 1.4–7.0)
Neutrophils: 53 %
Platelets: 246 x10E3/uL (ref 150–450)
RBC: 5.17 x10E6/uL (ref 3.77–5.28)
RDW: 14 % (ref 11.7–15.4)
WBC: 5.8 x10E3/uL (ref 3.4–10.8)

## 2023-09-09 LAB — MICROALBUMIN / CREATININE URINE RATIO
Creatinine, Urine: 94.6 mg/dL
Microalb/Creat Ratio: 32 mg/g{creat} — ABNORMAL HIGH (ref 0–29)
Microalbumin, Urine: 30.4 ug/mL

## 2023-09-09 LAB — LIPID PANEL
Chol/HDL Ratio: 7.2 ratio — ABNORMAL HIGH (ref 0.0–4.4)
Cholesterol, Total: 208 mg/dL — ABNORMAL HIGH (ref 100–199)
HDL: 29 mg/dL — ABNORMAL LOW (ref >39)
LDL Chol Calc (NIH): 102 mg/dL — ABNORMAL HIGH (ref 0–99)
Triglycerides: 453 mg/dL — ABNORMAL HIGH (ref 0–149)
VLDL Cholesterol Cal: 77 mg/dL — ABNORMAL HIGH (ref 5–40)

## 2023-09-09 LAB — HEMOGLOBIN A1C
Est. average glucose Bld gHb Est-mCnc: 183 mg/dL
Hgb A1c MFr Bld: 8 % — ABNORMAL HIGH (ref 4.8–5.6)

## 2023-09-09 MED ORDER — FENOFIBRATE 145 MG PO TABS: 145.0000 mg | ORAL_TABLET | Freq: Every day | ORAL | 2 refills | Status: DC

## 2023-11-03 ENCOUNTER — Encounter: Payer: Self-pay | Admitting: Family Medicine

## 2023-11-03 ENCOUNTER — Ambulatory Visit: Admitting: Family Medicine

## 2023-11-03 VITALS — BP 123/78 | HR 61 | Ht 67.0 in | Wt 248.0 lb

## 2023-11-03 DIAGNOSIS — G43109 Migraine with aura, not intractable, without status migrainosus: Secondary | ICD-10-CM

## 2023-11-03 MED ORDER — NURTEC 75 MG PO TBDP: 1.0000 | ORAL_TABLET | ORAL | 5 refills | Status: AC

## 2023-11-03 NOTE — Progress Notes (Unsigned)
   Acute Office Visit  Subjective:     Patient ID: Miranda Delacruz, female    DOB: [birth year], 63 y.o.   MRN: [id number]  Chief Complaint  Patient presents with   Migraine    HPI Patient is in today for   Subjective - Reports recurrent migraines, with one daily for the past three days. First recent migraine occurred last week after a several-year remission. Describes them as similar to previous migraines with aura, nausea, and severe unilateral pain. The worst episode was yesterday, with severe pain and nausea. Reports lying down exacerbates symptoms. Has not vomited but felt she would have if she sat up.  Medications: Took sumatriptan  50mg  (from daughter) which provided some relief but reports feeling weird from it. Also on metoprolol . For current symptoms, has tried over-the-counter Nausene (sodium citrate dihydrate) and Tylenol /ibuprofen without effect.  PMH, PSH, FH, Social Hx: PMHx: Migraines since age 35, currently on metoprolol . FHx: Daughter also has migraines.  ROS: Neuro: Positive for aura and unilateral headache. GI: Positive for nausea. Denies vomiting.  Objective None mentioned.  Assessment and Plan Recurrent Migraines: History of migraines since adolescence with recent recurrence after years of remission. Describes classic migraine with aura, nausea, and severe unilateral pain. Over-the-counter analgesics and antiemetics have been ineffective. Sumatriptan  provided some relief but with undesirable side effects. Currently on metoprolol  which can serve as a prophylactic. - Prescribed Nurtec ODT (rimegepant) 75 mg, #16, for preventive use (one tablet every other day). - Counseled on use for prevention (every other day) or as an abortive treatment as needed. - Discussed Nurtec is a CGRP antagonist, different from triptans, with a generally more favorable side effect profile, though drowsiness is possible. - Provided manufacturer copay cards for Nurtec and [place] in case of  insurance preference. - Will consider adding Zavzpret nasal spray for abortive therapy at a future visit if needed. - Will prescribe a prescription antiemetic such as Zofran , Phenergan , or Compazine if requested for nausea.     ROS      Objective:    BP 123/78   Pulse 61   Ht 5' 7 (1.702 m)   Wt 248 lb (112.5 kg)   SpO2 98%   BMI 38.84 kg/m  {Vitals History (Optional):23777}  Physical Exam  No results found for any visits on 11/03/23.      Assessment & Plan:   There are no diagnoses linked to this encounter.   No follow-ups on file.  Toribio MARLA Slain, MD

## 2023-11-03 NOTE — Patient Instructions (Signed)
 It was nice to see you today,  We addressed the following topics today: -I have sent in Nurtec.  This medication can be used as an abortive medication or preventative.  It is to be taken every other day as needed or every other day if used for prevention. - If you have any issues with the affordability or the coverage for this please let us  know and I will try something else. - I provided you with a sample card to help with the cost.  Have a great day,  Rolan Slain, MD

## 2023-11-06 MED ORDER — ONDANSETRON HCL 4 MG PO TABS: 4.0000 mg | ORAL_TABLET | Freq: Three times a day (TID) | ORAL | 0 refills | Status: AC | PRN

## 2023-11-06 NOTE — Assessment & Plan Note (Signed)
 Recurrent Migraines: History of migraines since adolescence with recent recurrence after years of remission. Describes classic migraine with aura, nausea, and severe unilateral pain. Over-the-counter analgesics and antiemetics have been ineffective. Sumatriptan  provided some relief but with undesirable side effects. Currently on metoprolol  which can serve as a prophylactic. - Prescribed Nurtec ODT (rimegepant) 75 mg, #16, for preventive use (one tablet every other day). - Counseled on use for prevention (every other day) or as an abortive treatment as needed. - Discussed Nurtec is a CGRP antagonist, different from triptans, with a generally more favorable side effect profile, though drowsiness is possible. - Provided manufacturer copay cards for Nurtec and [place] in case of insurance preference. - Will consider adding Zavzpret nasal spray for abortive therapy at a future visit if needed. - Will prescribe a prescription antiemetic

## 2023-11-30 ENCOUNTER — Other Ambulatory Visit: Payer: Self-pay

## 2023-12-02 ENCOUNTER — Other Ambulatory Visit: Payer: Self-pay | Admitting: Family Medicine

## 2023-12-09 ENCOUNTER — Ambulatory Visit

## 2023-12-30 ENCOUNTER — Ambulatory Visit

## 2023-12-30 VITALS — BP 119/78 | HR 63 | Temp 98.1°F | Ht 67.0 in | Wt 252.0 lb

## 2023-12-30 DIAGNOSIS — E039 Hypothyroidism, unspecified: Secondary | ICD-10-CM

## 2023-12-30 DIAGNOSIS — E119 Type 2 diabetes mellitus without complications: Secondary | ICD-10-CM | POA: Diagnosis not present

## 2023-12-30 DIAGNOSIS — E782 Mixed hyperlipidemia: Secondary | ICD-10-CM

## 2023-12-30 DIAGNOSIS — E1159 Type 2 diabetes mellitus with other circulatory complications: Secondary | ICD-10-CM

## 2023-12-30 DIAGNOSIS — M62838 Other muscle spasm: Secondary | ICD-10-CM

## 2023-12-30 DIAGNOSIS — E66813 Obesity, class 3: Secondary | ICD-10-CM

## 2023-12-30 DIAGNOSIS — Z6841 Body Mass Index (BMI) 40.0 and over, adult: Secondary | ICD-10-CM

## 2023-12-30 DIAGNOSIS — Z7984 Long term (current) use of oral hypoglycemic drugs: Secondary | ICD-10-CM

## 2023-12-30 DIAGNOSIS — G43109 Migraine with aura, not intractable, without status migrainosus: Secondary | ICD-10-CM

## 2023-12-30 DIAGNOSIS — I152 Hypertension secondary to endocrine disorders: Secondary | ICD-10-CM | POA: Diagnosis not present

## 2023-12-30 DIAGNOSIS — M62830 Muscle spasm of back: Secondary | ICD-10-CM | POA: Insufficient documentation

## 2023-12-30 DIAGNOSIS — E1169 Type 2 diabetes mellitus with other specified complication: Secondary | ICD-10-CM

## 2023-12-30 LAB — POCT UA - MICROALBUMIN
Albumin/Creatinine Ratio, Urine, POC: <30
Creatinine, POC: 100 mg/dL
Microalbumin Ur, POC: 30 mg/L

## 2023-12-30 LAB — POCT GLYCOSYLATED HEMOGLOBIN (HGB A1C): Hemoglobin A1C: 7.4 % — AB (ref 4.0–5.6)

## 2023-12-30 MED ORDER — CYCLOBENZAPRINE HCL 5 MG PO TABS: 2.5000 mg | ORAL_TABLET | Freq: Three times a day (TID) | ORAL | 0 refills | Status: AC | PRN

## 2023-12-30 NOTE — Assessment & Plan Note (Signed)
 BP goal <130/80.  BP well within goal at office today.  Continue valsartan  160 mg, amlodipine  5 mg, hydrochlorothiazide  12.5 mg and metoprolol  XL 100 mg for blood pressure management.  Will continue to monitor.

## 2023-12-30 NOTE — Assessment & Plan Note (Signed)
 Stable and well-controlled on metoprolol  XL 100 mg daily for preventative measures.  Insurance denied coverage for Nurtec, however patient is not having frequent migraines at this time.  Discussed sample card for Zavzpret in the future if needed.

## 2023-12-30 NOTE — Progress Notes (Signed)
 Established Patient Office Visit  Subjective   Patient ID: Miranda Delacruz, female    DOB: May 03, 1960  Age: 63 y.o. MRN: [id number]  Chief Complaint  Patient presents with   Medical Management of Chronic Issues    HPI  History of Present Illness   Miranda Delacruz is a 63 year old female with type 2 diabetes and hypertension who presents for follow-up of her diabetes management.  Glycemic control and antihyperglycemic therapy - Type 2 diabetes mellitus with recent improvement in A1c from 8.0 to 7.4 since last visit - Currently taking metformin  extended-release 1000 mg in the morning and 1000 mg at night, with good tolerance - Previously experienced severe diarrhea on standard metformin  formulation, improved with switch to extended-release - History of Mounjaro  use, discontinued due to cost - History of Jardiance  use, discontinued due to urinary tract infections - History of Januvia  use, discontinued due to side effects  - No current use of other diabetes medications  Hypertriglyceridemia management - History of elevated triglycerides - Currently taking fenofibrate  - Noted improvement in blood glucose levels after starting fenofibrate  - Not fasting today; plans to schedule fasting laboratory appointment to check triglyceride levels  Hypertension management - Hypertension managed with valsartan  160 mg, amlodipine  5 mg, and hydrochlorothiazide  12.5 mg, and Metoprolol  XL 100 mg   Thyroid  dysfunction status post thyroidectomy - History of thyroid  cancer and thyroidectomy - Currently taking thyroid  replacement therapy at 175 mcg  Migraine headaches - Recent increase in migraine frequency, attributed to stress - Prescribed metoprolol  for migraine prevention by [facility]  - No recent migraines, attributed to improved stress management  Right upper quadrant abdominal pain - Right upper abdominal pain occurring at night described as a catching sensation if she moves the wrong way in  bed  - Pain partially relieved with chiropractic treatment  Preventive health maintenance - No eye exam completed this year; plans to schedule - Due for colonoscopy; plans to defer until after the new year due to insurance and personal circumstances          ROS Per HPI.    Objective:     BP 119/78   Pulse 63   Temp 98.1 F (36.7 C) (Oral)   Ht 5' 7 (1.702 m)   Wt 252 lb (114.3 kg)   SpO2 98%   BMI 39.47 kg/m    Physical Exam Constitutional:      General: She is not in acute distress.    Appearance: Normal appearance.  Cardiovascular:     Rate and Rhythm: Normal rate and regular rhythm.     Heart sounds: Normal heart sounds. No murmur heard.    No friction rub. No gallop.  Pulmonary:     Effort: Pulmonary effort is normal. No respiratory distress.     Breath sounds: Normal breath sounds.  Musculoskeletal:        General: No swelling. Normal range of motion.     Cervical back: Neck supple.  Lymphadenopathy:     Cervical: No cervical adenopathy.  Skin:    General: Skin is warm and dry.  Neurological:     General: No focal deficit present.     Mental Status: She is alert.  Psychiatric:        Mood and Affect: Mood normal.        Behavior: Behavior normal.        Thought Content: Thought content normal.      Results for orders placed or performed in visit on 12/30/23  POCT HgB A1C  Result Value Ref Range   Hemoglobin A1C 7.4 (A) 4.0 - 5.6 %   HbA1c POC (<> result, manual entry)     HbA1c, POC (prediabetic range)     HbA1c, POC (controlled diabetic range)    POCT UA - Microalbumin  Result Value Ref Range   Microalbumin Ur, POC 30 mg/L   Creatinine, POC 100 mg/dL   Albumin/Creatinine Ratio, Urine, POC <30     Last CBC Lab Results  Component Value Date   WBC 5.8 09/08/2023   HGB 15.2 09/08/2023   HCT 46.8 (H) 09/08/2023   MCV 91 09/08/2023   MCH 29.4 09/08/2023   RDW 14.0 09/08/2023   PLT 246 09/08/2023   Last metabolic panel Lab Results   Component Value Date   GLUCOSE 278 (H) 09/08/2023   NA 137 09/08/2023   K 4.0 09/08/2023   CL 96 09/08/2023   CO2 22 09/08/2023   BUN 10 09/08/2023   CREATININE 0.67 09/08/2023   EGFR 99 09/08/2023   CALCIUM  10.2 09/08/2023   PROT 6.7 09/08/2023   ALBUMIN 4.9 09/08/2023   LABGLOB 1.8 09/08/2023   AGRATIO 2.3 (H) 05/12/2022   BILITOT 0.8 09/08/2023   ALKPHOS 79 09/08/2023   AST 31 09/08/2023   ALT 39 (H) 09/08/2023   ANIONGAP 8 09/19/2021   Last lipids Lab Results  Component Value Date   CHOL 208 (H) 09/08/2023   HDL 29 (L) 09/08/2023   LDLCALC 102 (H) 09/08/2023   TRIG 453 (H) 09/08/2023   CHOLHDL 7.2 (H) 09/08/2023   Last hemoglobin A1c Lab Results  Component Value Date   HGBA1C 7.4 (A) 12/30/2023   Last thyroid  functions Lab Results  Component Value Date   TSH 1.720 09/08/2023   FREET4 1.67 11/13/2022   Last vitamin D  Lab Results  Component Value Date   VD25OH 53.5 11/13/2022      The 10-year ASCVD risk score (Arnett DK, et al., 2019) is: 12.2%    Assessment & Plan:   Type 2 diabetes mellitus without complication, without long-term current use of insulin  (HCC) Assessment & Plan: A1c improved to 7.4. Metformin  continued. Previous medications discontinued due to cost and side effects. Goal A1c <7.0. - Continue metformin  extended release 1000 mg twice daily. - Recheck A1c in 3 months. - History of Mounjaro  use, discontinued due to cost - History of Jardiance  use, discontinued due to urinary tract infections - History of Januvia  use, discontinued due to side effects  - Only other options for add-on therapy would include pioglitazone or a sulfonylurea if A1c does not meet goal of <7% - UACR updated today and was WNL -Discussed the need for eye exam within the calendar year.  Orders: -     POCT glycosylated hemoglobin (Hb A1C) -     POCT UA - Microalbumin -     CBC with Differential/Platelet; Future -     Comprehensive metabolic panel with GFR;  Future -     Lipid panel; Future  Muscle spasm -     Cyclobenzaprine  HCl; Take 0.5-1 tablets (2.5-5 mg total) by mouth 3 (three) times daily as needed for muscle spasms.  Dispense: 20 tablet; Refill: 0  Acquired hypothyroidism Assessment & Plan: Rechecking thyroid  panel with labs.  Continue levothyroxine  175 mcg daily for now.  Will continue to monitor.  Orders: -     Thyroid  Panel With TSH; Future  BMI 40.0-44.9, adult [facility]) Assessment & Plan: Discussed calorie deficit diet and incorporating physical activity  to continue weight loss efforts   Hypertension associated with diabetes (HCC) Assessment & Plan: BP goal <130/80.  BP well within goal at office today.  Continue valsartan  160 mg, amlodipine  5 mg, hydrochlorothiazide  12.5 mg and metoprolol  XL 100 mg for blood pressure management.  Will continue to monitor.   Migraine with aura and without status migrainosus, not intractable Assessment & Plan: Stable and well-controlled on metoprolol  XL 100 mg daily for preventative measures.  Insurance denied coverage for Nurtec, however patient is not having frequent migraines at this time.  Discussed sample card for Zavzpret in the future if needed.   Mixed diabetic hyperlipidemia associated with type 2 diabetes mellitus (HCC) Assessment & Plan: Last lipid panel: LDL 102, HDL 29, triglycerides 453. The 10-year ASCVD risk score (Arnett DK, et al., 2019) is: 12.2% Dr. Chandra started her on fenofibrate  145 mg for her triglycerides.  She has been doing well on this for about 4 months.  Will recheck fasting lipid panel with labs.   Spasm of thoracic back muscle Assessment & Plan: Intermittent, right sided mid thoracic back spasms.  Mildly improved with chiropractic therapy.  Discussed treatment options including NSAIDs versus muscle relaxers versus physical therapy and dry needling.  Description does not meet criteria for imaging at this time. Agreed on Flexeril  2.5 to 5 mg nightly as needed  for muscle spasms. - In the future consider dry needling if symptoms persist or worsen.     Return in about 3 months (around 03/31/2024) for DM, HTN, HLD.    Miranda JULIANNA Sacks, PA-C

## 2023-12-30 NOTE — Patient Instructions (Signed)
 VISIT SUMMARY: Today, we reviewed your diabetes management, hypertension, hypertriglyceridemia, thyroid  function, and other health concerns. Your A1c has improved, and we discussed your current medications and future plans for lab tests and preventive care.  YOUR PLAN: TYPE 2 DIABETES MELLITUS: Your A1c has improved to 7.4. You are currently taking metformin  extended release 1000 mg twice daily. -Continue taking metformin  extended release 1000 mg twice daily. -Recheck A1c in 3 months.  HYPERTRIGLYCERIDEMIA: You have a history of elevated triglycerides and are currently taking fenofibrate . -Schedule a fasting lab appointment to check triglyceride levels.  ESSENTIAL HYPERTENSION: Your blood pressure is managed with valsartan , amlodipine , and hydrochlorothiazide . -Continue taking valsartan , amlodipine , and hydrochlorothiazide  as prescribed.  HYPOTHYROIDISM STATUS POST THYROIDECTOMY FOR THYROID  CANCER: You are taking levothyroxine  175 mcg for thyroid  hormone replacement. -We have ordered a TSH, T3, and T4 panel to monitor your thyroid  hormone levels.  MUSCLE SPASM, RIGHT UPPER BACK: You have intermittent muscle spasms in your right upper back. -Start taking Flexeril  5 mg, beginning with 2.5 mg at bedtime. -Consider dry needling if symptoms persist or worsen.  GENERAL HEALTH MAINTENANCE: Routine health maintenance was discussed. -Schedule an eye exam. -Plan to have a colonoscopy after the first of the year.  If you have any problems before your next visit feel free to message me via MyChart (minor issues or questions) or call the office, otherwise you may reach out to schedule an office visit.  Thank you! Saddie Sacks, PA-C

## 2023-12-30 NOTE — Assessment & Plan Note (Signed)
 Rechecking thyroid  panel with labs.  Continue levothyroxine  175 mcg daily for now.  Will continue to monitor.

## 2023-12-30 NOTE — Assessment & Plan Note (Signed)
 Last lipid panel: LDL 102, HDL 29, triglycerides 453. The 10-year ASCVD risk score (Arnett DK, et al., 2019) is: 12.2% Dr. Chandra started her on fenofibrate  145 mg for her triglycerides.  She has been doing well on this for about 4 months.  Will recheck fasting lipid panel with labs.

## 2023-12-30 NOTE — Assessment & Plan Note (Addendum)
 A1c improved to 7.4. Metformin  continued. Previous medications discontinued due to cost and side effects. Goal A1c <7.0. - Continue metformin  extended release 1000 mg twice daily. - Recheck A1c in 3 months. - History of Mounjaro  use, discontinued due to cost - History of Jardiance  use, discontinued due to urinary tract infections - History of Januvia  use, discontinued due to side effects  - Only other options for add-on therapy would include pioglitazone or a sulfonylurea if A1c does not meet goal of <7% - UACR updated today and was WNL -Discussed the need for eye exam within the calendar year.

## 2023-12-30 NOTE — Assessment & Plan Note (Signed)
 Discussed calorie deficit diet and incorporating physical activity to continue weight loss efforts

## 2023-12-30 NOTE — Assessment & Plan Note (Signed)
 Intermittent, right sided mid thoracic back spasms.  Mildly improved with chiropractic therapy.  Discussed treatment options including NSAIDs versus muscle relaxers versus physical therapy and dry needling.  Description does not meet criteria for imaging at this time. Agreed on Flexeril  2.5 to 5 mg nightly as needed for muscle spasms. - In the future consider dry needling if symptoms persist or worsen.

## 2023-12-31 ENCOUNTER — Other Ambulatory Visit: Payer: Self-pay

## 2024-01-03 ENCOUNTER — Other Ambulatory Visit

## 2024-01-03 DIAGNOSIS — E039 Hypothyroidism, unspecified: Secondary | ICD-10-CM

## 2024-01-03 DIAGNOSIS — E119 Type 2 diabetes mellitus without complications: Secondary | ICD-10-CM

## 2024-01-04 LAB — THYROID PANEL WITH TSH
Free Thyroxine Index: 3 (ref 1.2–4.9)
T3 Uptake Ratio: 30 % (ref 24–39)
T4, Total: 10.1 ug/dL (ref 4.5–12.0)
TSH: 1.51 u[IU]/mL (ref 0.450–4.500)

## 2024-01-04 LAB — CBC WITH DIFFERENTIAL/PLATELET
Basophils Absolute: 0 x10E3/uL (ref 0.0–0.2)
Basos: 1 %
EOS (ABSOLUTE): 0.1 x10E3/uL (ref 0.0–0.4)
Eos: 2 %
Hematocrit: 42.9 % (ref 34.0–46.6)
Hemoglobin: 14 g/dL (ref 11.1–15.9)
Immature Grans (Abs): 0 x10E3/uL (ref 0.0–0.1)
Immature Granulocytes: 0 %
Lymphocytes Absolute: 1.9 x10E3/uL (ref 0.7–3.1)
Lymphs: 38 %
MCH: 28.4 pg (ref 26.6–33.0)
MCHC: 32.6 g/dL (ref 31.5–35.7)
MCV: 87 fL (ref 79–97)
Monocytes Absolute: 0.3 x10E3/uL (ref 0.1–0.9)
Monocytes: 6 %
Neutrophils Absolute: 2.7 x10E3/uL (ref 1.4–7.0)
Neutrophils: 52 %
Platelets: 245 x10E3/uL (ref 150–450)
RBC: 4.93 x10E6/uL (ref 3.77–5.28)
RDW: 13.8 % (ref 11.7–15.4)
WBC: 5.1 x10E3/uL (ref 3.4–10.8)

## 2024-01-04 LAB — COMPREHENSIVE METABOLIC PANEL WITH GFR
ALT: 41 IU/L — ABNORMAL HIGH (ref 0–32)
AST: 28 IU/L (ref 0–40)
Albumin: 4.8 g/dL (ref 3.9–4.9)
Alkaline Phosphatase: 72 IU/L (ref 49–135)
BUN/Creatinine Ratio: 16 (ref 12–28)
BUN: 10 mg/dL (ref 8–27)
Bilirubin Total: 0.7 mg/dL (ref 0.0–1.2)
CO2: 23 mmol/L (ref 20–29)
Calcium: 9.5 mg/dL (ref 8.7–10.3)
Chloride: 97 mmol/L (ref 96–106)
Creatinine, Ser: 0.63 mg/dL (ref 0.57–1.00)
Globulin, Total: 1.8 g/dL (ref 1.5–4.5)
Glucose: 238 mg/dL — ABNORMAL HIGH (ref 70–99)
Potassium: 4.3 mmol/L (ref 3.5–5.2)
Sodium: 138 mmol/L (ref 134–144)
Total Protein: 6.6 g/dL (ref 6.0–8.5)
eGFR: 101 mL/min/1.73 (ref >59)

## 2024-01-04 LAB — LIPID PANEL
Chol/HDL Ratio: 6.3 ratio — ABNORMAL HIGH (ref 0.0–4.4)
Cholesterol, Total: 232 mg/dL — ABNORMAL HIGH (ref 100–199)
HDL: 37 mg/dL — ABNORMAL LOW (ref >39)
LDL Chol Calc (NIH): 147 mg/dL — ABNORMAL HIGH (ref 0–99)
Triglycerides: 260 mg/dL — ABNORMAL HIGH (ref 0–149)
VLDL Cholesterol Cal: 48 mg/dL — ABNORMAL HIGH (ref 5–40)

## 2024-01-05 ENCOUNTER — Ambulatory Visit: Payer: Self-pay

## 2024-01-13 ENCOUNTER — Other Ambulatory Visit: Payer: Self-pay | Admitting: Family Medicine

## 2024-01-22 ENCOUNTER — Encounter: Payer: Self-pay | Admitting: Internal Medicine

## 2024-01-27 ENCOUNTER — Other Ambulatory Visit: Payer: Self-pay

## 2024-03-23 ENCOUNTER — Other Ambulatory Visit: Payer: Self-pay | Admitting: Family Medicine

## 2024-03-23 DIAGNOSIS — I152 Hypertension secondary to endocrine disorders: Secondary | ICD-10-CM

## 2024-03-27 ENCOUNTER — Other Ambulatory Visit: Payer: Self-pay | Admitting: Family Medicine

## 2024-03-27 DIAGNOSIS — I152 Hypertension secondary to endocrine disorders: Secondary | ICD-10-CM

## 2024-04-03 ENCOUNTER — Ambulatory Visit

## 2024-05-09 ENCOUNTER — Ambulatory Visit

## 2024-05-11 ENCOUNTER — Ambulatory Visit
